# Patient Record
Sex: Female | Born: 1990 | Race: Black or African American | Hispanic: No | Marital: Single | Smoking: Current every day smoker
Health system: Southern US, Community
[De-identification: ages and names within clinical notes are randomized; demographics above are authoritative.]

## PROBLEM LIST (undated history)

## (undated) ENCOUNTER — Inpatient Hospital Stay (HOSPITAL_COMMUNITY): Payer: Self-pay

## (undated) DIAGNOSIS — Z789 Other specified health status: Secondary | ICD-10-CM

## (undated) DIAGNOSIS — I10 Essential (primary) hypertension: Secondary | ICD-10-CM

## (undated) HISTORY — PX: CLAVICLE SURGERY: SHX598

## (undated) HISTORY — PX: ACROMIO-CLAVICULAR JOINT REPAIR: SHX5183

## (undated) HISTORY — DX: Essential (primary) hypertension: I10

---
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
## 2012-01-30 ENCOUNTER — Encounter (HOSPITAL_COMMUNITY): Payer: Self-pay | Admitting: *Deleted

## 2012-01-30 ENCOUNTER — Inpatient Hospital Stay (HOSPITAL_COMMUNITY)
Admission: AD | Admit: 2012-01-30 | Discharge: 2012-01-30 | Disposition: A | Discharge status: Home / Self Care | Payer: Medicaid Other | Source: Ambulatory Visit | Attending: Obstetrics | Admitting: Obstetrics

## 2012-01-30 DIAGNOSIS — O368190 Decreased fetal movements, unspecified trimester, not applicable or unspecified: Secondary | ICD-10-CM | POA: Insufficient documentation

## 2012-01-30 NOTE — MAU Note (Signed)
FM audible on EFM, pt states she felt the movement.

## 2012-01-30 NOTE — MAU Note (Signed)
Pt states she was in an argument last night, she was very upset & stressed.  Pt also noticed decreased FM at this time.  Pt denies bleeding, discharge or LOF.
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
## 2012-01-30 NOTE — Discharge Instructions (Signed)
Fetal Movement Counts Patient Name: __________________________________________________ Patient Due Date: ____________________ Kick counts is highly recommended in high risk pregnancies, but it is a good idea for every pregnant woman to do. Start counting fetal movements at 28 weeks of the pregnancy. Fetal movements increase after eating a full meal or eating or drinking something sweet (the blood sugar is higher). It is also important to drink plenty of fluids (well hydrated) before doing the count. Lie on your left side because it helps with the circulation or you can sit in a comfortable chair with your arms over your belly (abdomen) with no distractions around you. DOING THE COUNT  Try to do the count the same time of day each time you do it.   Mark the day and time, then see how long it takes for you to feel 10 movements (kicks, flutters, swishes, rolls). You should have at least 10 movements within 2 hours. You will most likely feel 10 movements in much less than 2 hours. If you do not, wait an hour and count again. After a couple of days you will see a pattern.   What you are looking for is a change in the pattern or not enough counts in 2 hours. Is it taking longer in time to reach 10 movements?  SEEK MEDICAL CARE IF:  You feel less than 10 counts in 2 hours. Tried twice.   No movement in one hour.   The pattern is changing or taking longer each day to reach 10 counts in 2 hours.   You feel the baby is not moving as it usually does.  Date: ____________ Movements: ____________ Start time: ____________ Finish time: ____________  Date: ____________ Movements: ____________ Start time: ____________ Finish time: ____________ Date: ____________ Movements: ____________ Start time: ____________ Finish time: ____________ Date: ____________ Movements: ____________ Start time: ____________ Finish time: ____________ Date: ____________ Movements: ____________ Start time: ____________ Finish time:  ____________ Date: ____________ Movements: ____________ Start time: ____________ Finish time: ____________ Date: ____________ Movements: ____________ Start time: ____________ Finish time: ____________ Date: ____________ Movements: ____________ Start time: ____________ Finish time: ____________  Date: ____________ Movements: ____________ Start time: ____________ Finish time: ____________ Date: ____________ Movements: ____________ Start time: ____________ Finish time: ____________ Date: ____________ Movements: ____________ Start time: ____________ Finish time: ____________ Date: ____________ Movements: ____________ Start time: ____________ Finish time: ____________ Date: ____________ Movements: ____________ Start time: ____________ Finish time: ____________ Date: ____________ Movements: ____________ Start time: ____________ Finish time: ____________ Date: ____________ Movements: ____________ Start time: ____________ Finish time: ____________  Date: ____________ Movements: ____________ Start time: ____________ Finish time: ____________ Date: ____________ Movements: ____________ Start time: ____________ Finish time: ____________ Date: ____________ Movements: ____________ Start time: ____________ Finish time: ____________ Date: ____________ Movements: ____________ Start time: ____________ Finish time: ____________ Date: ____________ Movements: ____________ Start time: ____________ Finish time: ____________ Date: ____________ Movements: ____________ Start time: ____________ Finish time: ____________ Date: ____________ Movements: ____________ Start time: ____________ Finish time: ____________  Date: ____________ Movements: ____________ Start time: ____________ Finish time: ____________ Date: ____________ Movements: ____________ Start time: ____________ Finish time: ____________ Date: ____________ Movements: ____________ Start time: ____________ Finish time: ____________ Date: ____________ Movements:  ____________ Start time: ____________ Finish time: ____________ Date: ____________ Movements: ____________ Start time: ____________ Finish time: ____________ Date: ____________ Movements: ____________ Start time: ____________ Finish time: ____________ Date: ____________ Movements: ____________ Start time: ____________ Finish time: ____________  Date: ____________ Movements: ____________ Start time: ____________ Finish time: ____________ Date: ____________ Movements: ____________ Start time: ____________ Finish time: ____________ Date: ____________ Movements: ____________ Start time:   ____________ Finish time: ____________ Date: ____________ Movements: ____________ Start time: ____________ Finish time: ____________ Date: ____________ Movements: ____________ Start time: ____________ Finish time: ____________ Date: ____________ Movements: ____________ Start time: ____________ Finish time: ____________ Date: ____________ Movements: ____________ Start time: ____________ Finish time: ____________  Date: ____________ Movements: ____________ Start time: ____________ Finish time: ____________ Date: ____________ Movements: ____________ Start time: ____________ Finish time: ____________ Date: ____________ Movements: ____________ Start time: ____________ Finish time: ____________ Date: ____________ Movements: ____________ Start time: ____________ Finish time: ____________ Date: ____________ Movements: ____________ Start time: ____________ Finish time: ____________ Date: ____________ Movements: ____________ Start time: ____________ Finish time: ____________ Date: ____________ Movements: ____________ Start time: ____________ Finish time: ____________  Date: ____________ Movements: ____________ Start time: ____________ Finish time: ____________ Date: ____________ Movements: ____________ Start time: ____________ Finish time: ____________ Date: ____________ Movements: ____________ Start time: ____________ Finish  time: ____________ Date: ____________ Movements: ____________ Start time: ____________ Finish time: ____________ Date: ____________ Movements: ____________ Start time: ____________ Finish time: ____________ Date: ____________ Movements: ____________ Start time: ____________ Finish time: ____________ Date: ____________ Movements: ____________ Start time: ____________ Finish time: ____________  Date: ____________ Movements: ____________ Start time: ____________ Finish time: ____________ Date: ____________ Movements: ____________ Start time: ____________ Finish time: ____________ Date: ____________ Movements: ____________ Start time: ____________ Finish time: ____________ Date: ____________ Movements: ____________ Start time: ____________ Finish time: ____________ Date: ____________ Movements: ____________ Start time: ____________ Finish time: ____________ Date: ____________ Movements: ____________ Start time: ____________ Finish time: ____________ Document Released: 08/28/2006 Document Revised: 07/18/2011 Document Reviewed: 02/28/2009 ExitCare Patient Information 2012 [employer]. 

## 2012-05-06 ENCOUNTER — Encounter (HOSPITAL_COMMUNITY): Payer: Self-pay | Admitting: *Deleted

## 2012-05-06 ENCOUNTER — Telehealth (HOSPITAL_COMMUNITY): Payer: Self-pay | Admitting: *Deleted

## 2012-05-06 NOTE — Telephone Encounter (Signed)
Preadmission screen  

## 2012-05-15 ENCOUNTER — Encounter (HOSPITAL_COMMUNITY): Payer: Self-pay

## 2012-05-15 ENCOUNTER — Inpatient Hospital Stay (HOSPITAL_COMMUNITY)
Admission: RE | Admit: 2012-05-15 | Discharge: 2012-05-19 | DRG: 775 | Disposition: A | Discharge status: Home / Self Care | Payer: Medicaid Other | Source: Ambulatory Visit | Attending: Obstetrics | Admitting: Obstetrics

## 2012-05-15 DIAGNOSIS — O328XX0 Maternal care for other malpresentation of fetus, not applicable or unspecified: Secondary | ICD-10-CM | POA: Diagnosis present

## 2012-05-15 LAB — CBC
HCT: 33 % — ABNORMAL LOW (ref 36.0–46.0)
MCH: 30.9 pg (ref 26.0–34.0)
MCHC: 33.9 g/dL (ref 30.0–36.0)
MCV: 90.9 fL (ref 78.0–100.0)
RDW: 12.5 % (ref 11.5–15.5)

## 2012-05-15 MED ORDER — LACTATED RINGERS IV SOLN: 500.0000 mL | INTRAVENOUS | Status: DC | PRN

## 2012-05-15 MED ORDER — LACTATED RINGERS IV SOLN
INTRAVENOUS | Status: DC
  Administered 2012-05-16 – 2012-05-17 (×3): via INTRAVENOUS

## 2012-05-15 MED ORDER — LIDOCAINE HCL (PF) 1 % IJ SOLN
30.0000 mL | INTRAMUSCULAR | Status: DC | PRN
  Administered 2012-05-17: 30 mL via SUBCUTANEOUS
  Filled 2012-05-15: qty 30

## 2012-05-15 MED ORDER — FLEET ENEMA 7-19 GM/118ML RE ENEM: 1.0000 | ENEMA | RECTAL | Status: DC | PRN

## 2012-05-15 MED ORDER — ZOLPIDEM TARTRATE 5 MG PO TABS: 5.0000 mg | ORAL_TABLET | Freq: Every evening | ORAL | Status: DC | PRN

## 2012-05-15 MED ORDER — OXYTOCIN 40 UNITS IN LACTATED RINGERS INFUSION - SIMPLE MED: 62.5000 mL/h | Freq: Once | INTRAVENOUS | Status: DC

## 2012-05-15 MED ORDER — CITRIC ACID-SODIUM CITRATE 334-500 MG/5ML PO SOLN: 30.0000 mL | ORAL | Status: DC | PRN

## 2012-05-15 MED ORDER — ONDANSETRON HCL 4 MG/2ML IJ SOLN
4.0000 mg | Freq: Four times a day (QID) | INTRAMUSCULAR | Status: DC | PRN
  Administered 2012-05-16 (×3): 4 mg via INTRAVENOUS
  Filled 2012-05-15 (×3): qty 2

## 2012-05-15 MED ORDER — OXYTOCIN BOLUS FROM INFUSION
500.0000 mL | Freq: Once | INTRAVENOUS | Status: DC
  Filled 2012-05-15: qty 500

## 2012-05-15 MED ORDER — ACETAMINOPHEN 325 MG PO TABS: 650.0000 mg | ORAL_TABLET | ORAL | Status: DC | PRN

## 2012-05-15 MED ORDER — OXYCODONE-ACETAMINOPHEN 5-325 MG PO TABS: 1.0000 | ORAL_TABLET | ORAL | Status: DC | PRN

## 2012-05-15 MED ORDER — BUTORPHANOL TARTRATE 1 MG/ML IJ SOLN
2.0000 mg | INTRAMUSCULAR | Status: DC | PRN
  Administered 2012-05-16: 2 mg via INTRAVENOUS
  Filled 2012-05-15: qty 2

## 2012-05-15 MED ORDER — IBUPROFEN 600 MG PO TABS: 600.0000 mg | ORAL_TABLET | Freq: Four times a day (QID) | ORAL | Status: DC | PRN

## 2012-05-15 MED ORDER — TERBUTALINE SULFATE 1 MG/ML IJ SOLN: 0.2500 mg | Freq: Once | INTRAMUSCULAR | Status: AC | PRN

## 2012-05-15 MED ORDER — OXYTOCIN 40 UNITS IN LACTATED RINGERS INFUSION - SIMPLE MED
1.0000 m[IU]/min | INTRAVENOUS | Status: DC
  Administered 2012-05-15: 2 m[IU]/min via INTRAVENOUS
  Administered 2012-05-15: 4 m[IU]/min via INTRAVENOUS
  Filled 2012-05-15: qty 1000

## 2012-05-16 ENCOUNTER — Encounter (HOSPITAL_COMMUNITY): Payer: Self-pay | Admitting: Anesthesiology

## 2012-05-16 ENCOUNTER — Inpatient Hospital Stay (HOSPITAL_COMMUNITY): Payer: Medicaid Other | Admitting: Anesthesiology

## 2012-05-16 MED ORDER — PHENYLEPHRINE 40 MCG/ML (10ML) SYRINGE FOR IV PUSH (FOR BLOOD PRESSURE SUPPORT): 80.0000 ug | PREFILLED_SYRINGE | INTRAVENOUS | Status: DC | PRN

## 2012-05-16 MED ORDER — LACTATED RINGERS IV SOLN: 500.0000 mL | Freq: Once | INTRAVENOUS | Status: DC

## 2012-05-16 MED ORDER — SODIUM BICARBONATE 8.4 % IV SOLN
INTRAVENOUS | Status: DC | PRN
  Administered 2012-05-16: 5 mL via EPIDURAL

## 2012-05-16 MED ORDER — DIPHENHYDRAMINE HCL 50 MG/ML IJ SOLN: 12.5000 mg | INTRAMUSCULAR | Status: DC | PRN

## 2012-05-16 MED ORDER — EPHEDRINE 5 MG/ML INJ: 10.0000 mg | INTRAVENOUS | Status: DC | PRN

## 2012-05-16 MED ORDER — EPHEDRINE 5 MG/ML INJ
10.0000 mg | INTRAVENOUS | Status: DC | PRN
  Filled 2012-05-16: qty 4

## 2012-05-16 MED ORDER — FENTANYL 2.5 MCG/ML BUPIVACAINE 1/10 % EPIDURAL INFUSION (WH - ANES)
INTRAMUSCULAR | Status: DC | PRN
  Administered 2012-05-16: 14 mL/h via EPIDURAL

## 2012-05-16 MED ORDER — PHENYLEPHRINE 40 MCG/ML (10ML) SYRINGE FOR IV PUSH (FOR BLOOD PRESSURE SUPPORT)
80.0000 ug | PREFILLED_SYRINGE | INTRAVENOUS | Status: DC | PRN
  Filled 2012-05-16: qty 5

## 2012-05-16 MED ORDER — FENTANYL 2.5 MCG/ML BUPIVACAINE 1/10 % EPIDURAL INFUSION (WH - ANES)
14.0000 mL/h | INTRAMUSCULAR | Status: DC
  Administered 2012-05-17: 14 mL/h via EPIDURAL
  Filled 2012-05-16 (×3): qty 123

## 2012-05-16 MED ORDER — OXYTOCIN 40 UNITS IN LACTATED RINGERS INFUSION - SIMPLE MED: 1.0000 m[IU]/min | INTRAVENOUS | Status: DC

## 2012-05-16 NOTE — Anesthesia Preprocedure Evaluation (Signed)

## 2012-05-16 NOTE — H&P (Signed)
This is Dr. Francoise Ceo dictating the history and physical on  Angela Cortez she's a 21 year old gravida 1 at 40 weeks and 4 days EDC 05/12/2012 was brought in for induction last night her GBS is negative she is contracting every 4-5 minutes her cervix is 2 cm 80% with the vertex at -2-3 station amniotomy was performed the fluids clear IUPC was inserted Past medical history negative Past surgical history negative Social history negative System review noncontributory Physical exam revealed a well-developed female in active labor HEENT negative Breasts negative Heart regular rhythm no murmurs no gallops Lungs clear to P&A Abdomen term Pelvic as described above Extremities negative

## 2012-05-16 NOTE — Anesthesia Procedure Notes (Signed)

## 2012-05-17 ENCOUNTER — Encounter (HOSPITAL_COMMUNITY): Payer: Self-pay

## 2012-05-17 MED ORDER — OXYCODONE-ACETAMINOPHEN 5-325 MG PO TABS: 1.0000 | ORAL_TABLET | ORAL | Status: DC | PRN

## 2012-05-17 MED ORDER — PRENATAL MULTIVITAMIN CH
1.0000 | ORAL_TABLET | Freq: Every day | ORAL | Status: DC
  Administered 2012-05-17 – 2012-05-18 (×2): 1 via ORAL
  Filled 2012-05-17 (×2): qty 1

## 2012-05-17 MED ORDER — DIBUCAINE 1 % RE OINT: 1.0000 "application " | TOPICAL_OINTMENT | RECTAL | Status: DC | PRN

## 2012-05-17 MED ORDER — MAGNESIUM HYDROXIDE 400 MG/5ML PO SUSP: 30.0000 mL | ORAL | Status: DC | PRN

## 2012-05-17 MED ORDER — BENZOCAINE-MENTHOL 20-0.5 % EX AERO
1.0000 "application " | INHALATION_SPRAY | CUTANEOUS | Status: DC | PRN
  Filled 2012-05-17: qty 56

## 2012-05-17 MED ORDER — MEASLES, MUMPS & RUBELLA VAC ~~LOC~~ INJ
0.5000 mL | INJECTION | Freq: Once | SUBCUTANEOUS | Status: DC
  Filled 2012-05-17: qty 0.5

## 2012-05-17 MED ORDER — ZOLPIDEM TARTRATE 5 MG PO TABS: 5.0000 mg | ORAL_TABLET | Freq: Every evening | ORAL | Status: DC | PRN

## 2012-05-17 MED ORDER — LANOLIN HYDROUS EX OINT: TOPICAL_OINTMENT | CUTANEOUS | Status: DC | PRN

## 2012-05-17 MED ORDER — FERROUS SULFATE 325 (65 FE) MG PO TABS
325.0000 mg | ORAL_TABLET | Freq: Two times a day (BID) | ORAL | Status: DC
  Administered 2012-05-17 – 2012-05-19 (×5): 325 mg via ORAL
  Filled 2012-05-17 (×5): qty 1

## 2012-05-17 MED ORDER — SENNOSIDES-DOCUSATE SODIUM 8.6-50 MG PO TABS
2.0000 | ORAL_TABLET | Freq: Every day | ORAL | Status: DC
  Administered 2012-05-17 – 2012-05-19 (×3): 2 via ORAL

## 2012-05-17 MED ORDER — ONDANSETRON HCL 4 MG PO TABS: 4.0000 mg | ORAL_TABLET | ORAL | Status: DC | PRN

## 2012-05-17 MED ORDER — WITCH HAZEL-GLYCERIN EX PADS: 1.0000 "application " | MEDICATED_PAD | CUTANEOUS | Status: DC | PRN

## 2012-05-17 MED ORDER — TETANUS-DIPHTH-ACELL PERTUSSIS 5-2.5-18.5 LF-MCG/0.5 IM SUSP
0.5000 mL | Freq: Once | INTRAMUSCULAR | Status: AC
  Administered 2012-05-18: 0.5 mL via INTRAMUSCULAR
  Filled 2012-05-17: qty 0.5

## 2012-05-17 MED ORDER — ONDANSETRON HCL 4 MG/2ML IJ SOLN: 4.0000 mg | INTRAMUSCULAR | Status: DC | PRN

## 2012-05-17 MED ORDER — IBUPROFEN 600 MG PO TABS
600.0000 mg | ORAL_TABLET | Freq: Four times a day (QID) | ORAL | Status: DC
  Administered 2012-05-17 – 2012-05-19 (×9): 600 mg via ORAL
  Filled 2012-05-17 (×9): qty 1

## 2012-05-17 MED ORDER — DIPHENHYDRAMINE HCL 25 MG PO CAPS: 25.0000 mg | ORAL_CAPSULE | Freq: Four times a day (QID) | ORAL | Status: DC | PRN

## 2012-05-17 NOTE — Anesthesia Postprocedure Evaluation (Signed)
  Anesthesia Post-op Note  Patient: Angela Cortez  Procedure(s) Performed: * No procedures listed *  Patient Location: [facility]  Anesthesia Type: Epidural  Level of Consciousness: awake, alert  and oriented  Airway and Oxygen Therapy: Patient Spontanous Breathing  Post-op Pain: mild  Post-op Assessment: Patient's Cardiovascular Status Stable, Respiratory Function Stable, Patent Airway, No signs of Nausea or vomiting and Pain level controlled  Post-op Vital Signs: stable  Complications: No apparent anesthesia complications

## 2012-05-17 NOTE — Plan of Care (Signed)
Problem: Discharge Progression Outcomes Goal: Activity appropriate for discharge plan Outcome: Completed/Met Date Met:  05/17/12 Patient walks halls frequently and tolerates it well. Goal: Complications resolved/controlled Outcome: Completed/Met Date Met:  05/17/12 Patient has had some problems with following visitation rules and this was dicussed with her and a couple of her family members as to why these rules are in place.Patient and family stated they understood why and were willing to cooperate. Goal: Pain controlled with appropriate interventions Outcome: Completed/Met Date Met:  05/17/12 Good relief with po Motrin at this point. Goal: Discharge plan in place and appropriate Outcome: Completed/Met Date Met:  05/17/12 Stable vital signs Pain control Understands self care Understands when to call MD

## 2012-05-17 NOTE — Clinical Social Work Note (Signed)
Clinical Social Work Department PSYCHOSOCIAL ASSESSMENT - MATERNAL/CHILD 05/17/2012  Patient:  Angela Cortez,Angela Cortez  Account Number:  [id number]  Admit Date:  05/15/2012  Childs Name:   Angela Cortez    Clinical Social Worker:  Nicollette Wilhelmi, LCSW   Date/Time:  05/17/2012 03:00 PM  Date Referred:  05/17/2012   Referral source  Physician     Referred reason  NICU   Other referral source:    I:  FAMILY / HOME ENVIRONMENT Child's legal guardian:  PARENT  Guardian - Name Guardian - Age Guardian - Address  Angela Cortez 21 [address] [place], [place] [address]  Angela Cortez     Other household support members/support persons Other support:    II  PSYCHOSOCIAL DATA Information Source:    Financial and Community Resources Employment:   MOB: unemployed  FOB:  Staff zone   Financial resources:  Medicaid If Medicaid - County:  [place] Other  Food Stamps  WIC   School / Grade:   Maternity Care Coordinator / Child Services Coordination / Early Interventions:  Cultural issues impacting care:    III  STRENGTHS Strengths  Adequate Resources  Home prepared for Child (including basic supplies)  Compliance with medical plan  Supportive family/friends  Understanding of illness   Strength comment:    IV  RISK FACTORS AND CURRENT PROBLEMS Current Problem:  None   Risk Factor & Current Problem Patient Issue Family Issue Risk Factor / Current Problem Comment   Cortez Cortez     V  SOCIAL WORK ASSESSMENT CSW spoke with MOB in room.  CSW discussed emotional state and typical symptoms to be expected.  MOB reported she had no concerns at this time.  MOB reports picking out pediatrician with [facility] (Dr. Wallace).  CSW discussed any concerns with supplies or family support. MOB did not express any concerns at this time and she had 6 family members in room and did not have any concerns with family support.  No hx of drug abuse or LPNC and no current concerns.  MOB reported to  CSW concerns with nursing care in the NICU.  CSW addressed this with charge nurse and will continue to follow infant and MOB for support.      VI SOCIAL WORK PLAN Social Work Plan  Psychosocial Support/Ongoing Assessment of Needs   Type of pt/family education:   If child protective services report - county:   If child protective services report - date:   Information/referral to community resources comment:   Other social work plan:    

## 2012-05-18 MED ORDER — COMPLETENATE 29-1 MG PO CHEW
1.0000 | CHEWABLE_TABLET | Freq: Every day | ORAL | Status: DC
  Administered 2012-05-19: 1 via ORAL
  Filled 2012-05-18 (×2): qty 1

## 2012-05-18 MED ORDER — MEDROXYPROGESTERONE ACETATE 150 MG/ML IM SUSP
150.0000 mg | Freq: Once | INTRAMUSCULAR | Status: AC
  Administered 2012-05-19: 150 mg via INTRAMUSCULAR
  Filled 2012-05-18: qty 1

## 2012-05-18 MED ORDER — INFLUENZA VIRUS VACC SPLIT PF IM SUSP
0.5000 mL | INTRAMUSCULAR | Status: AC
  Administered 2012-05-18: 0.5 mL via INTRAMUSCULAR

## 2012-05-18 NOTE — Care Management (Signed)
Ur chart review completed.  

## 2012-05-18 NOTE — Progress Notes (Signed)
Patient ID: Angela Cortez, female   DOB: 03-06-1991, 20 y.o.   MRN: [id number] Postpartum day one Vital signs normal Fundus firm Lochia moderate Legs negative doing well

## 2012-05-19 NOTE — Discharge Summary (Signed)
Obstetric Discharge Summary Reason for Admission: induction of labor Prenatal Procedures: none Intrapartum Procedures: spontaneous vaginal delivery Postpartum Procedures: none Complications-Operative and Postpartum: none Hemoglobin  Date Value Range Status  05/15/2012 11.2* 12.0 - 15.0 g/dL Final     HCT  Date Value Range Status  05/15/2012 33.0* 36.0 - 46.0 % Final    Physical Exam:  General: alert Lochia: appropriate Uterine Fundus: firm Incision: healing well DVT Evaluation: No evidence of DVT seen on physical exam.  Discharge Diagnoses: Term Pregnancy-delivered  Discharge Information: Date: 05/19/2012 Activity: pelvic rest Diet: routine Medications: Percocet Condition: stable Instructions: refer to practice specific booklet Discharge to: home Follow-up Information    In 6 weeks to follow up.   Contact information:   b Angela Cortez         Newborn Data: Live born female  Birth Weight: 7 lb 13.9 oz (3569 g) APGAR: 1, 5  Home with mother.  Angela Cortez A 05/19/2012, 7:10 AM

## 2013-09-13 LAB — LAB REPORT - SCANNED: PAP SMEAR: NEGATIVE

## 2014-06-13 ENCOUNTER — Encounter (HOSPITAL_COMMUNITY): Payer: Self-pay

## 2014-07-08 ENCOUNTER — Emergency Department (HOSPITAL_COMMUNITY)
Admission: EM | Admit: 2014-07-08 | Discharge: 2014-07-09 | Disposition: A | Discharge status: Home / Self Care | Payer: Medicaid Other | Attending: Emergency Medicine | Admitting: Emergency Medicine

## 2014-07-08 ENCOUNTER — Encounter (HOSPITAL_COMMUNITY): Payer: Self-pay | Admitting: Emergency Medicine

## 2014-07-08 DIAGNOSIS — F911 Conduct disorder, childhood-onset type: Secondary | ICD-10-CM | POA: Insufficient documentation

## 2014-07-08 DIAGNOSIS — Z3202 Encounter for pregnancy test, result negative: Secondary | ICD-10-CM | POA: Diagnosis not present

## 2014-07-08 DIAGNOSIS — F1210 Cannabis abuse, uncomplicated: Secondary | ICD-10-CM | POA: Insufficient documentation

## 2014-07-08 DIAGNOSIS — Z87891 Personal history of nicotine dependence: Secondary | ICD-10-CM | POA: Diagnosis not present

## 2014-07-08 DIAGNOSIS — R454 Irritability and anger: Secondary | ICD-10-CM

## 2014-07-08 LAB — COMPREHENSIVE METABOLIC PANEL
ALK PHOS: 61 U/L (ref 39–117)
ALT: 7 U/L (ref 0–35)
AST: 16 U/L (ref 0–37)
Albumin: 4 g/dL (ref 3.5–5.2)
Anion gap: 14 (ref 5–15)
BUN: 10 mg/dL (ref 6–23)
CHLORIDE: 101 meq/L (ref 96–112)
CO2: 25 meq/L (ref 19–32)
CREATININE: 0.78 mg/dL (ref 0.50–1.10)
Calcium: 9.9 mg/dL (ref 8.4–10.5)
GFR calc Af Amer: >90 mL/min (ref >90)
Glucose, Bld: 120 mg/dL — ABNORMAL HIGH (ref 70–99)
Potassium: 3.8 mEq/L (ref 3.7–5.3)
Sodium: 140 mEq/L (ref 137–147)
Total Protein: 8 g/dL (ref 6.0–8.3)

## 2014-07-08 LAB — CBC WITH DIFFERENTIAL/PLATELET
Basophils Absolute: 0 10*3/uL (ref 0.0–0.1)
Basophils Relative: 0 % (ref 0–1)
Eosinophils Absolute: 0.1 10*3/uL (ref 0.0–0.7)
Eosinophils Relative: 1 % (ref 0–5)
HCT: 38.3 % (ref 36.0–46.0)
Hemoglobin: 12.9 g/dL (ref 12.0–15.0)
Lymphocytes Relative: 38 % (ref 12–46)
Lymphs Abs: 2.9 10*3/uL (ref 0.7–4.0)
MCH: 31.4 pg (ref 26.0–34.0)
MCHC: 33.7 g/dL (ref 30.0–36.0)
MCV: 93.2 fL (ref 78.0–100.0)
Monocytes Absolute: 0.4 10*3/uL (ref 0.1–1.0)
Monocytes Relative: 6 % (ref 3–12)
Neutro Abs: 4.2 10*3/uL (ref 1.7–7.7)
Neutrophils Relative %: 55 % (ref 43–77)
Platelets: 271 10*3/uL (ref 150–400)
RBC: 4.11 MIL/uL (ref 3.87–5.11)
RDW: 12.6 % (ref 11.5–15.5)
WBC: 7.7 10*3/uL (ref 4.0–10.5)

## 2014-07-08 LAB — RAPID URINE DRUG SCREEN, HOSP PERFORMED
AMPHETAMINES: NOT DETECTED
BARBITURATES: NOT DETECTED
Benzodiazepines: NOT DETECTED
Cocaine: NOT DETECTED
Opiates: NOT DETECTED
TETRAHYDROCANNABINOL: POSITIVE — AB

## 2014-07-08 LAB — ACETAMINOPHEN LEVEL: Acetaminophen (Tylenol), Serum: <15 ug/mL (ref 10–30)

## 2014-07-08 LAB — PREGNANCY, URINE: Preg Test, Ur: NEGATIVE

## 2014-07-08 LAB — SALICYLATE LEVEL: Salicylate Lvl: <2 mg/dL — ABNORMAL LOW (ref 2.8–20.0)

## 2014-07-08 LAB — ETHANOL

## 2014-07-08 MED ORDER — IBUPROFEN 200 MG PO TABS: 600.0000 mg | ORAL_TABLET | Freq: Three times a day (TID) | ORAL | Status: DC | PRN

## 2014-07-08 MED ORDER — NICOTINE 21 MG/24HR TD PT24: 21.0000 mg | MEDICATED_PATCH | Freq: Every day | TRANSDERMAL | Status: DC

## 2014-07-08 MED ORDER — LORAZEPAM 1 MG PO TABS: 1.0000 mg | ORAL_TABLET | Freq: Three times a day (TID) | ORAL | Status: DC | PRN

## 2014-07-08 MED ORDER — ACETAMINOPHEN 325 MG PO TABS: 650.0000 mg | ORAL_TABLET | ORAL | Status: DC | PRN

## 2014-07-08 MED ORDER — ZOLPIDEM TARTRATE 5 MG PO TABS: 5.0000 mg | ORAL_TABLET | Freq: Every evening | ORAL | Status: DC | PRN

## 2014-07-08 MED ORDER — ONDANSETRON HCL 4 MG PO TABS: 4.0000 mg | ORAL_TABLET | Freq: Three times a day (TID) | ORAL | Status: DC | PRN

## 2014-07-08 MED ORDER — ALUM & MAG HYDROXIDE-SIMETH 200-200-20 MG/5ML PO SUSP: 30.0000 mL | ORAL | Status: DC | PRN
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
## 2014-07-08 NOTE — Discharge Instructions (Signed)
Anger Management Anger is a normal human emotion. However, anger can range from mild irritation to rage. When your anger becomes harmful to yourself or others, it is unhealthy anger.  CAUSES  There are many reasons for unhealthy anger. Many people learn how to express anger from observing how their family expressed anger. In troubled, chaotic, or abusive families, anger can be expressed as rage or even violence. Children can grow up never learning how healthy anger can be expressed. Factors that contribute to unhealthy anger include:   Drug or alcohol abuse.  Post-traumatic stress disorder.  Traumatic brain injury. COMPLICATIONS  People with unhealthy anger tend to overreact and retaliate against a real or imagined threat. The need to retaliate can turn into violence or verbal abuse against another person. Chronic anger can lead to health problems, such as hypertension, high blood pressure, and depression. TREATMENT  Exercising, relaxing, meditating, or writing out your feelings all can be beneficial in managing moderate anger. For unhealthy anger, the following methods may be used:  Cognitive-behavioral counseling (learning skills to change the thoughts that influence your mood).  Relaxation training.  Interpersonal counseling.  Assertive communication skills.  Medication. Document Released: 05/26/2007 Document Revised: 10/21/2011 Document Reviewed: 10/04/2010 ExitCare Patient Information 2015 [employer]. This information is not intended to replace advice given to you by your health care provider. Make sure you discuss any questions you have with your health care provider.  

## 2014-07-08 NOTE — ED Notes (Signed)
Pt belongings are at the triage nurses station at this time.

## 2014-07-08 NOTE — BH Assessment (Signed)
Assessment completed. Consulted Serena Colonel, NP who agrees that pt does not meet inpatient criteria. Antonietta Breach, PA-C has been informed of the recommendation. Spoke with Dr. Ralene Bathe about rescinding the IVC paperwork. EDP will speak with petitioner prior to making a decision.
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
## 2014-07-09 NOTE — ED Notes (Signed)
Cell phone was returned to Pt, Pt signed sheet from security acknowledging she received her property.   This was witnessed by Enzo Bi with security.
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
## 2015-01-26 ENCOUNTER — Encounter (HOSPITAL_COMMUNITY): Payer: Self-pay | Admitting: Emergency Medicine

## 2015-01-26 DIAGNOSIS — R197 Diarrhea, unspecified: Secondary | ICD-10-CM | POA: Diagnosis not present

## 2015-01-26 DIAGNOSIS — R109 Unspecified abdominal pain: Secondary | ICD-10-CM | POA: Insufficient documentation

## 2015-01-26 DIAGNOSIS — N898 Other specified noninflammatory disorders of vagina: Secondary | ICD-10-CM | POA: Insufficient documentation

## 2015-01-26 LAB — URINALYSIS, ROUTINE W REFLEX MICROSCOPIC
Glucose, UA: NEGATIVE mg/dL
Hgb urine dipstick: NEGATIVE
Ketones, ur: 15 mg/dL — AB
Leukocytes, UA: NEGATIVE
NITRITE: NEGATIVE
Protein, ur: NEGATIVE mg/dL
Specific Gravity, Urine: 1.035 — ABNORMAL HIGH (ref 1.005–1.030)
UROBILINOGEN UA: 0.2 mg/dL (ref 0.0–1.0)
pH: 5.5 (ref 5.0–8.0)

## 2015-01-26 LAB — CBC WITH DIFFERENTIAL/PLATELET
BASOS PCT: 0 % (ref 0–1)
Basophils Absolute: 0 10*3/uL (ref 0.0–0.1)
Eosinophils Absolute: 0.2 10*3/uL (ref 0.0–0.7)
Eosinophils Relative: 2 % (ref 0–5)
HEMATOCRIT: 35.3 % — AB (ref 36.0–46.0)
HEMOGLOBIN: 12.3 g/dL (ref 12.0–15.0)
LYMPHS ABS: 2.8 10*3/uL (ref 0.7–4.0)
LYMPHS PCT: 42 % (ref 12–46)
MCH: 32.1 pg (ref 26.0–34.0)
MCHC: 34.8 g/dL (ref 30.0–36.0)
MCV: 92.2 fL (ref 78.0–100.0)
MONOS PCT: 8 % (ref 3–12)
Monocytes Absolute: 0.6 10*3/uL (ref 0.1–1.0)
NEUTROS ABS: 3.2 10*3/uL (ref 1.7–7.7)
Neutrophils Relative %: 48 % (ref 43–77)
Platelets: 275 10*3/uL (ref 150–400)
RBC: 3.83 MIL/uL — ABNORMAL LOW (ref 3.87–5.11)
RDW: 12.9 % (ref 11.5–15.5)
WBC: 6.7 10*3/uL (ref 4.0–10.5)

## 2015-01-26 LAB — POC URINE PREG, ED: Preg Test, Ur: NEGATIVE

## 2015-01-26 NOTE — ED Notes (Signed)
The patient said she has been having abdominal pain for three.  She says she has been feeling nausea and has had diarrhea.  The patient said she has taken a pregnancy test and it was negative.  The patent says she has had a white discharge, that is thick and foul smelling.

## 2015-01-27 ENCOUNTER — Emergency Department (HOSPITAL_COMMUNITY)
Admission: EM | Admit: 2015-01-27 | Discharge: 2015-01-27 | Discharge status: Against Medical Advice | Payer: Medicaid Other | Attending: Emergency Medicine | Admitting: Emergency Medicine

## 2015-01-27 LAB — LIPASE, BLOOD: LIPASE: 14 U/L — AB (ref 22–51)

## 2015-01-27 LAB — COMPREHENSIVE METABOLIC PANEL
ALK PHOS: 51 U/L (ref 38–126)
ALT: 11 U/L — AB (ref 14–54)
AST: 24 U/L (ref 15–41)
Albumin: 3.8 g/dL (ref 3.5–5.0)
Anion gap: 8 (ref 5–15)
CO2: 26 mmol/L (ref 22–32)
Calcium: 8.9 mg/dL (ref 8.9–10.3)
Chloride: 101 mmol/L (ref 101–111)
Creatinine, Ser: 0.88 mg/dL (ref 0.44–1.00)
GFR calc non Af Amer: >60 mL/min (ref >60)
GLUCOSE: 97 mg/dL (ref 65–99)
POTASSIUM: 3.2 mmol/L — AB (ref 3.5–5.1)
Sodium: 135 mmol/L (ref 135–145)
Total Bilirubin: 0.3 mg/dL (ref 0.3–1.2)
Total Protein: 7 g/dL (ref 6.5–8.1)

## 2015-01-27 NOTE — ED Notes (Addendum)
Called pt multiple times with no answer.

## 2015-03-27 ENCOUNTER — Emergency Department (HOSPITAL_COMMUNITY)
Admission: EM | Admit: 2015-03-27 | Discharge: 2015-03-27 | Disposition: A | Discharge status: Home / Self Care | Payer: Medicaid Other | Attending: Emergency Medicine | Admitting: Emergency Medicine

## 2015-03-27 ENCOUNTER — Encounter (HOSPITAL_COMMUNITY): Payer: Self-pay | Admitting: Emergency Medicine

## 2015-03-27 DIAGNOSIS — B9689 Other specified bacterial agents as the cause of diseases classified elsewhere: Secondary | ICD-10-CM

## 2015-03-27 DIAGNOSIS — N760 Acute vaginitis: Secondary | ICD-10-CM | POA: Diagnosis not present

## 2015-03-27 DIAGNOSIS — N898 Other specified noninflammatory disorders of vagina: Secondary | ICD-10-CM | POA: Diagnosis present

## 2015-03-27 DIAGNOSIS — Z720 Tobacco use: Secondary | ICD-10-CM | POA: Diagnosis not present

## 2015-03-27 DIAGNOSIS — Z3202 Encounter for pregnancy test, result negative: Secondary | ICD-10-CM | POA: Diagnosis not present

## 2015-03-27 LAB — CBC WITH DIFFERENTIAL/PLATELET
BASOS PCT: 0 % (ref 0–1)
Basophils Absolute: 0 10*3/uL (ref 0.0–0.1)
Eosinophils Absolute: 0.2 10*3/uL (ref 0.0–0.7)
Eosinophils Relative: 3 % (ref 0–5)
HCT: 37.6 % (ref 36.0–46.0)
Hemoglobin: 12.9 g/dL (ref 12.0–15.0)
Lymphocytes Relative: 51 % — ABNORMAL HIGH (ref 12–46)
Lymphs Abs: 3 10*3/uL (ref 0.7–4.0)
MCH: 31.4 pg (ref 26.0–34.0)
MCHC: 34.3 g/dL (ref 30.0–36.0)
MCV: 91.5 fL (ref 78.0–100.0)
Monocytes Absolute: 0.7 10*3/uL (ref 0.1–1.0)
Monocytes Relative: 11 % (ref 3–12)
NEUTROS PCT: 35 % — AB (ref 43–77)
Neutro Abs: 2.1 10*3/uL (ref 1.7–7.7)
Platelets: 244 10*3/uL (ref 150–400)
RBC: 4.11 MIL/uL (ref 3.87–5.11)
RDW: 12.7 % (ref 11.5–15.5)
WBC: 5.9 10*3/uL (ref 4.0–10.5)

## 2015-03-27 LAB — WET PREP, GENITAL
Trich, Wet Prep: NONE SEEN
YEAST WET PREP: NONE SEEN

## 2015-03-27 LAB — COMPREHENSIVE METABOLIC PANEL
ALBUMIN: 4.2 g/dL (ref 3.5–5.0)
ALK PHOS: 57 U/L (ref 38–126)
ALT: 10 U/L — AB (ref 14–54)
ANION GAP: 11 (ref 5–15)
AST: 19 U/L (ref 15–41)
BUN: 9 mg/dL (ref 6–20)
CO2: 23 mmol/L (ref 22–32)
CREATININE: 0.97 mg/dL (ref 0.44–1.00)
Calcium: 9.4 mg/dL (ref 8.9–10.3)
Chloride: 102 mmol/L (ref 101–111)
GFR calc Af Amer: >60 mL/min (ref >60)
GFR calc non Af Amer: >60 mL/min (ref >60)
GLUCOSE: 95 mg/dL (ref 65–99)
Potassium: 4.2 mmol/L (ref 3.5–5.1)
SODIUM: 136 mmol/L (ref 135–145)
Total Bilirubin: 0.3 mg/dL (ref 0.3–1.2)
Total Protein: 7.9 g/dL (ref 6.5–8.1)

## 2015-03-27 LAB — URINALYSIS, ROUTINE W REFLEX MICROSCOPIC
Bilirubin Urine: NEGATIVE
Glucose, UA: NEGATIVE mg/dL
Hgb urine dipstick: NEGATIVE
KETONES UR: NEGATIVE mg/dL
LEUKOCYTES UA: NEGATIVE
NITRITE: NEGATIVE
Protein, ur: NEGATIVE mg/dL
Specific Gravity, Urine: 1.026 (ref 1.005–1.030)
Urobilinogen, UA: 1 mg/dL (ref 0.0–1.0)
pH: 6 (ref 5.0–8.0)

## 2015-03-27 LAB — POC URINE PREG, ED: Preg Test, Ur: NEGATIVE

## 2015-03-27 MED ORDER — METRONIDAZOLE 500 MG PO TABS: 500.0000 mg | ORAL_TABLET | Freq: Two times a day (BID) | ORAL | Status: DC

## 2015-03-27 NOTE — Discharge Instructions (Signed)
Bacterial Vaginosis Bacterial vaginosis is a vaginal infection that occurs when the normal balance of bacteria in the vagina is disrupted. It results from an overgrowth of certain bacteria. This is the most common vaginal infection in women of childbearing age. Treatment is important to prevent complications, especially in pregnant women, as it can cause a premature delivery. CAUSES  Bacterial vaginosis is caused by an increase in harmful bacteria that are normally present in smaller amounts in the vagina. Several different kinds of bacteria can cause bacterial vaginosis. However, the reason that the condition develops is not fully understood. RISK FACTORS Certain activities or behaviors can put you at an increased risk of developing bacterial vaginosis, including:  Having a new sex partner or multiple sex partners.  Douching.  Using an intrauterine device (IUD) for contraception. Women do not get bacterial vaginosis from toilet seats, bedding, swimming pools, or contact with objects around them. SIGNS AND SYMPTOMS  Some women with bacterial vaginosis have no signs or symptoms. Common symptoms include:  Grey vaginal discharge.  A fishlike odor with discharge, especially after sexual intercourse.  Itching or burning of the vagina and vulva.  Burning or pain with urination. DIAGNOSIS  Your health care provider will take a medical history and examine the vagina for signs of bacterial vaginosis. A sample of vaginal fluid may be taken. Your health care provider will look at this sample under a microscope to check for bacteria and abnormal cells. A vaginal pH test may also be done.  TREATMENT  Bacterial vaginosis may be treated with antibiotic medicines. These may be given in the form of a pill or a vaginal cream. A second round of antibiotics may be prescribed if the condition comes back after treatment.  HOME CARE INSTRUCTIONS   Only take over-the-counter or prescription medicines as  directed by your health care provider.  If antibiotic medicine was prescribed, take it as directed. Make sure you finish it even if you start to feel better.  Do not have sex until treatment is completed.  Tell all sexual partners that you have a vaginal infection. They should see their health care provider and be treated if they have problems, such as a mild rash or itching.  Practice safe sex by using condoms and only having one sex partner. SEEK MEDICAL CARE IF:   Your symptoms are not improving after 3 days of treatment.  You have increased discharge or pain.  You have a fever. MAKE SURE YOU:   Understand these instructions.  Will watch your condition.  Will get help right away if you are not doing well or get worse. FOR MORE INFORMATION  Centers for Disease Control and Prevention, Division of STD Prevention: www.cdc.gov/std American Sexual Health Association (ASHA): www.ashastd.org  Document Released: 07/29/2005 Document Revised: 05/19/2013 Document Reviewed: 03/10/2013 ExitCare Patient Information 2015 [employer]. This information is not intended to replace advice given to you by your health care provider. Make sure you discuss any questions you have with your health care provider.  

## 2015-03-27 NOTE — ED Notes (Signed)
Pt. reports malodorous vaginal discharge for 2 weeks , denies dysuria , no fever or chills.

## 2015-03-27 NOTE — ED Provider Notes (Signed)
CSN: [id number]     Arrival date & time 03/27/15  2043 History  This chart was scribed for Ripley Fraise, MD by Ludger Nutting, ED Scribe. This patient was seen in room TR01C/TR01C and the patient's care was started 9:30 PM.    Chief Complaint  Patient presents with  . Vaginal Discharge    The history is provided by the patient. No language interpreter was used.     HPI Comments: Angela Cortez is a 24 y.o. female who presents to the Emergency Department complaining of intermittent, unchanged, malodorous vaginal discharge for 2 weeks. She reports recent anal intercourse with significant other which she believes is the cause of her vaginal discharge. She denies fever, vomiting, abdominal pain  Past Medical History  Diagnosis Date  . No pertinent past medical history    Past Surgical History  Procedure Laterality Date  . Clavicle surgery     Family History  Problem Relation Age of Onset  . Diabetes Father   . Hypertension Father   . Cancer Paternal Uncle   . Cancer Paternal Grandmother    Social History  Substance Use Topics  . Smoking status: Current Every Day Smoker -- 0.25 packs/day    Types: Cigarettes  . Smokeless tobacco: None  . Alcohol Use: Yes     Comment: occ   OB History    Gravida Para Term Preterm AB TAB SAB Ectopic Multiple Living   1 1 1             Review of Systems  Constitutional: Negative for fever.  Gastrointestinal: Negative for vomiting and abdominal pain.  Genitourinary: Positive for vaginal discharge. Negative for vaginal bleeding.   Allergies  Review of patient's allergies indicates no known allergies.  Home Medications   Prior to Admission medications   Not on File   BP 108/72 mmHg  Pulse 93  Temp(Src) 98.9 F (37.2 C) (Oral)  Resp 18  Ht 5\' 4"  (1.626 m)  Wt 152 lb (68.947 kg)  BMI 26.08 kg/m2  SpO2 99%  LMP 03/15/2015 Physical Exam  Nursing note and vitals reviewed. CONSTITUTIONAL: Well developed/well nourished HEAD:  Normocephalic/atraumatic EYES: EOMI ENMT: Mucous membranes moist NECK: supple no meningeal signs CV: S1/S2 noted, no murmurs/rubs/gallops noted LUNGS: Lungs are clear to auscultation bilaterally, no apparent distress ABDOMEN: soft, nontender, no rebound or guarding, bowel sounds noted throughout abdomen GU:no cva tenderness, no CMT, whitish discharge noted, no vaginal bleeding noted, female chaperone present for exam NEURO: Pt is awake/alert/appropriate, moves all extremitiesx4.   SKIN: warm, color normal PSYCH: no abnormalities of mood noted, alert and oriented to situation   ED Course  Procedures   DIAGNOSTIC STUDIES: Oxygen Saturation is 99% on RA, normal by my interpretation.    COORDINATION OF CARE: 9:33 PM Will order labs and pelvic exam. Discussed treatment plan with pt at bedside and pt agreed to plan. 10:46 PM Will treat for BV Advised to avoid all sexual activity until symptoms resolved and tests are negative   Labs Review Labs Reviewed  WET PREP, GENITAL - Abnormal; Notable for the following:    Clue Cells Wet Prep HPF POC MODERATE (*)    WBC, Wet Prep HPF POC FEW (*)    All other components within normal limits  URINALYSIS, ROUTINE W REFLEX MICROSCOPIC (NOT AT [facility]) - Abnormal; Notable for the following:    APPearance CLOUDY (*)    All other components within normal limits  CBC WITH DIFFERENTIAL/PLATELET - Abnormal; Notable for the following:  Neutrophils Relative % 35 (*)    Lymphocytes Relative 51 (*)    All other components within normal limits  COMPREHENSIVE METABOLIC PANEL - Abnormal; Notable for the following:    ALT 10 (*)    All other components within normal limits  POC URINE PREG, ED  GC/CHLAMYDIA PROBE AMP ([place]) NOT AT [facility]    MDM   Final diagnoses:  Bacterial vaginosis    Nursing notes including past medical history and social history reviewed and considered in documentation Labs/vital reviewed myself and considered during  evaluation   I personally performed the services described in this documentation, which was scribed in my presence. The recorded information has been reviewed and is accurate.      Ripley Fraise, MD 03/27/15 [phone]

## 2015-03-28 LAB — GC/CHLAMYDIA PROBE AMP (~~LOC~~) NOT AT ARMC
Chlamydia: NEGATIVE
Neisseria Gonorrhea: NEGATIVE

## 2015-07-18 ENCOUNTER — Encounter (HOSPITAL_COMMUNITY): Payer: Self-pay | Admitting: Emergency Medicine

## 2015-07-18 ENCOUNTER — Emergency Department (HOSPITAL_COMMUNITY): Payer: Medicaid Other

## 2015-07-18 ENCOUNTER — Emergency Department (HOSPITAL_COMMUNITY)
Admission: EM | Admit: 2015-07-18 | Discharge: 2015-07-18 | Disposition: A | Discharge status: Home / Self Care | Payer: Medicaid Other | Attending: Emergency Medicine | Admitting: Emergency Medicine

## 2015-07-18 ENCOUNTER — Emergency Department (HOSPITAL_COMMUNITY)
Admission: EM | Admit: 2015-07-18 | Discharge: 2015-07-18 | Disposition: A | Discharge status: Home / Self Care | Payer: Medicaid Other | Source: Home / Self Care | Attending: Emergency Medicine | Admitting: Emergency Medicine

## 2015-07-18 ENCOUNTER — Other Ambulatory Visit: Payer: Self-pay

## 2015-07-18 DIAGNOSIS — R079 Chest pain, unspecified: Secondary | ICD-10-CM | POA: Diagnosis present

## 2015-07-18 DIAGNOSIS — R0789 Other chest pain: Secondary | ICD-10-CM | POA: Diagnosis not present

## 2015-07-18 DIAGNOSIS — F17210 Nicotine dependence, cigarettes, uncomplicated: Secondary | ICD-10-CM | POA: Insufficient documentation

## 2015-07-18 DIAGNOSIS — R0602 Shortness of breath: Secondary | ICD-10-CM | POA: Diagnosis not present

## 2015-07-18 LAB — BASIC METABOLIC PANEL
Anion gap: 5 (ref 5–15)
Anion gap: 5 (ref 5–15)
BUN: 8 mg/dL (ref 6–20)
BUN: 6 mg/dL (ref 6–20)
CALCIUM: 9.3 mg/dL (ref 8.9–10.3)
CO2: 26 mmol/L (ref 22–32)
CO2: 29 mmol/L (ref 22–32)
CREATININE: 0.78 mg/dL (ref 0.44–1.00)
Calcium: 9.1 mg/dL (ref 8.9–10.3)
Chloride: 107 mmol/L (ref 101–111)
Chloride: 103 mmol/L (ref 101–111)
Creatinine, Ser: 0.83 mg/dL (ref 0.44–1.00)
GFR calc Af Amer: >60 mL/min (ref >60)
GFR calc non Af Amer: >60 mL/min (ref >60)
Glucose, Bld: 87 mg/dL (ref 65–99)
Glucose, Bld: 102 mg/dL — ABNORMAL HIGH (ref 65–99)
Potassium: 4 mmol/L (ref 3.5–5.1)
Potassium: 3.5 mmol/L (ref 3.5–5.1)
SODIUM: 138 mmol/L (ref 135–145)
SODIUM: 137 mmol/L (ref 135–145)

## 2015-07-18 LAB — CBC
HCT: 36.1 % (ref 36.0–46.0)
HEMATOCRIT: 35.6 % — AB (ref 36.0–46.0)
Hemoglobin: 11.8 g/dL — ABNORMAL LOW (ref 12.0–15.0)
Hemoglobin: 11.7 g/dL — ABNORMAL LOW (ref 12.0–15.0)
MCH: 30.9 pg (ref 26.0–34.0)
MCH: 30.3 pg (ref 26.0–34.0)
MCHC: 33.1 g/dL (ref 30.0–36.0)
MCHC: 32.4 g/dL (ref 30.0–36.0)
MCV: 93.2 fL (ref 78.0–100.0)
MCV: 93.5 fL (ref 78.0–100.0)
PLATELETS: 259 10*3/uL (ref 150–400)
Platelets: 256 10*3/uL (ref 150–400)
RBC: 3.82 MIL/uL — AB (ref 3.87–5.11)
RBC: 3.86 MIL/uL — AB (ref 3.87–5.11)
RDW: 13 % (ref 11.5–15.5)
RDW: 13.1 % (ref 11.5–15.5)
WBC: 10.1 10*3/uL (ref 4.0–10.5)
WBC: 8.6 10*3/uL (ref 4.0–10.5)

## 2015-07-18 LAB — I-STAT BETA HCG BLOOD, ED (MC, WL, AP ONLY): I-stat hCG, quantitative: <5 m[IU]/mL (ref <5)

## 2015-07-18 LAB — I-STAT TROPONIN, ED
TROPONIN I, POC: 0.01 ng/mL (ref 0.00–0.08)
Troponin i, poc: 0 ng/mL (ref 0.00–0.08)

## 2015-07-18 MED ORDER — IBUPROFEN 800 MG PO TABS: 800.0000 mg | ORAL_TABLET | Freq: Three times a day (TID) | ORAL | Status: DC

## 2015-07-18 MED ORDER — KETOROLAC TROMETHAMINE 30 MG/ML IJ SOLN
30.0000 mg | Freq: Once | INTRAMUSCULAR | Status: AC
  Administered 2015-07-18: 30 mg via INTRAVENOUS
  Filled 2015-07-18: qty 1

## 2015-07-18 NOTE — Discharge Instructions (Signed)
Take your medications as prescribed as needed for pain relief. Follow-up with your primary care provider at your scheduled appointment. Return to the emergency department if symptoms worsen or new onset of fever, difficulty breathing, coughing up blood, vomiting, abdominal pain, syncope, seizure.

## 2015-07-18 NOTE — ED Notes (Signed)
Pt. reports left chest pain onset this evening with SOB , denies cough , no emesis or diaphoresis .

## 2015-07-18 NOTE — ED Notes (Signed)
Pt left with all belongings and ambulated out of the treatment area.  

## 2015-07-18 NOTE — ED Notes (Signed)
Pts name called for xray no answer. Registration notified that she saw pt get in a car and leave

## 2015-07-18 NOTE — ED Notes (Signed)
The patient was extremely upset.  She said she was not going to wait any longer because she had already waited a long time.  I advised her that the longest wait was 4:00 hours and her wait time was 2:00hours.  She decided to leave after being triaged.

## 2015-07-18 NOTE — ED Notes (Signed)
PT walked up to registration desk and reported she left last night AMA . Pt wants to check in with CP now.

## 2015-07-18 NOTE — ED Provider Notes (Signed)
CSN: [id number]     Arrival date & time 07/18/15  1415 History   First MD Initiated Contact with Patient 07/18/15 [place]     Chief Complaint  Patient presents with  . Chest Pain     (Consider location/radiation/quality/duration/timing/severity/associated sxs/prior Treatment) HPI   Patient is a 24 year old female who presents to the ED with complaint of chest pain, onset last night. Patient reports having constant sharp midsternal chest pain, denies radiation of pain. She states the pain is aggravated with coughing, deep breathing or twisting/turning. She reports having mild shortness of breath due to pain. Denies fever, chills, headache, diaphoresis, cough, wheezing abdominal pain, nausea, vomiting, numbness, tingling, weakness, leg swelling. Patient denies any recent fall or trauma but notes that she has recently been lifting heavy packs of soda and often picks up children she is caring for at home. Patient reports she has had similar pain in the past and notes that it typically will go away on its own. She denies taking any medications prior to arrival.  Past Medical History  Diagnosis Date  . No pertinent past medical history    Past Surgical History  Procedure Laterality Date  . Clavicle surgery     Family History  Problem Relation Age of Onset  . Diabetes Father   . Hypertension Father   . Cancer Paternal Uncle   . Cancer Paternal Grandmother    Social History  Substance Use Topics  . Smoking status: Current Every Day Smoker -- 0.25 packs/day    Types: Cigarettes  . Smokeless tobacco: None  . Alcohol Use: Yes     Comment: occasionally   OB History    Gravida Para Term Preterm AB TAB SAB Ectopic Multiple Living   1 1 1             Review of Systems  Respiratory: Positive for shortness of breath.   Cardiovascular: Positive for chest pain.  All other systems reviewed and are negative.     Allergies  Review of patient's allergies indicates no known allergies.  Home  Medications   Prior to Admission medications   Medication Sig Start Date End Date Taking? Authorizing Provider  ibuprofen (ADVIL,MOTRIN) 800 MG tablet Take 1 tablet (800 mg total) by mouth 3 (three) times daily. 07/18/15   Nona Dell, PA-C  metroNIDAZOLE (FLAGYL) 500 MG tablet Take 1 tablet (500 mg total) by mouth 2 (two) times daily. One po bid x 7 days 03/27/15   Ripley Fraise, MD   BP 96/80 mmHg  Pulse 77  Temp(Src) 98.5 F (36.9 C) (Oral)  Resp 25  SpO2 98%  LMP 06/27/2015 (Exact Date) Physical Exam  Constitutional: She is oriented to person, place, and time. She appears well-developed and well-nourished. No distress.  HENT:  Head: Normocephalic and atraumatic.  Mouth/Throat: Oropharynx is clear and moist. No oropharyngeal exudate.  Eyes: Conjunctivae and EOM are normal. Right eye exhibits no discharge. Left eye exhibits no discharge. No scleral icterus.  Neck: Normal range of motion. Neck supple.  Cardiovascular: Normal rate, regular rhythm, normal heart sounds and intact distal pulses.   No murmur heard. Pulmonary/Chest: Effort normal and breath sounds normal. No respiratory distress. She has no wheezes. She has no rales. She exhibits tenderness (midsternal and left chest wall TTP).  Abdominal: Soft. Bowel sounds are normal. She exhibits no distension and no mass. There is no tenderness. There is no rebound and no guarding.  Musculoskeletal: Normal range of motion. She exhibits no edema.  Neurological: She is  alert and oriented to person, place, and time.  Skin: Skin is warm and dry. She is not diaphoretic.  Nursing note and vitals reviewed.   ED Course  Procedures (including critical care time) Labs Review Labs Reviewed  BASIC METABOLIC PANEL - Abnormal; Notable for the following:    Glucose, Bld 102 (*)    All other components within normal limits  CBC - Abnormal; Notable for the following:    RBC 3.86 (*)    Hemoglobin 11.7 (*)    All other components  within normal limits  Randolm Idol, ED    Imaging Review Dg Chest 2 View  07/18/2015  CLINICAL DATA:  24 year old female with chest pain for 2 days. Initial encounter. EXAM: CHEST  2 VIEW COMPARISON:  None. FINDINGS: Lung volumes are within normal limits. Normal cardiac size and mediastinal contours. Visualized tracheal air column is within normal limits. The lungs are clear. No pneumothorax or effusion. Minimal dextro convex spinal curvature. No acute osseous abnormality identified. IMPRESSION: Negative, no acute cardiopulmonary abnormality. Electronically Signed   By: Genevie Ann M.D.   On: 07/18/2015 14:47   I have personally reviewed and evaluated these images and lab results as part of my medical decision-making.   EKG Interpretation None     Filed Vitals:   07/18/15 1900 07/18/15 1922  BP: 106/71 96/80  Pulse: 77 77  Temp:  98.5 F (36.9 C)  Resp: 21 25     MDM   Final diagnoses:  Chest wall pain    Pt presents with midsternal chest pain. She notes she has had similar chest pain in the past that has resolved on its own. VSS. Exam revealed tenderness to midsternal and left chest wall, exam otherwise unremarkable. EKG showed normal sinus rhythm. Chest x-ray negative. Troponin negative. Labs unremarkable. Patient given NSAIDs in the ED. I have a low suspicion for ACS, PE, dissection, or other acute cardiac event at this time. I suspect patient's symptoms are likely due to musculoskeletal chest wall etiology. Patient reports her pain has improved in the ED. Plan to discharge patient home with NSAIDs. Patient advised to follow-up with her PCP at her scheduled appointment next week.  Evaluation does not show pathology requring ongoing emergent intervention or admission. Pt is hemodynamically stable and mentating appropriately. Discussed findings/results and plan with patient/guardian, who agrees with plan. All questions answered. Return precautions discussed and outpatient follow up  given.        Chesley Noon [place], [place] [id number]  Delora Fuel, MD [id number]

## 2015-07-18 NOTE — ED Notes (Signed)
Pt states chest pain started yesterday - midsternal- radiates to back. Hurts to breath, hurts to move. Pt in no acute distress at present

## 2015-11-27 ENCOUNTER — Inpatient Hospital Stay (HOSPITAL_COMMUNITY)
Admission: AD | Admit: 2015-11-27 | Discharge: 2015-11-27 | Disposition: A | Discharge status: Home / Self Care | Payer: Medicaid Other | Source: Ambulatory Visit | Attending: Obstetrics | Admitting: Obstetrics

## 2015-11-27 ENCOUNTER — Encounter (HOSPITAL_COMMUNITY): Payer: Self-pay | Admitting: *Deleted

## 2015-11-27 DIAGNOSIS — A499 Bacterial infection, unspecified: Secondary | ICD-10-CM

## 2015-11-27 DIAGNOSIS — R1030 Lower abdominal pain, unspecified: Secondary | ICD-10-CM | POA: Diagnosis present

## 2015-11-27 DIAGNOSIS — F17210 Nicotine dependence, cigarettes, uncomplicated: Secondary | ICD-10-CM | POA: Diagnosis not present

## 2015-11-27 DIAGNOSIS — B9689 Other specified bacterial agents as the cause of diseases classified elsewhere: Secondary | ICD-10-CM

## 2015-11-27 DIAGNOSIS — N760 Acute vaginitis: Secondary | ICD-10-CM | POA: Diagnosis not present

## 2015-11-27 LAB — URINALYSIS, ROUTINE W REFLEX MICROSCOPIC
BILIRUBIN URINE: NEGATIVE
GLUCOSE, UA: NEGATIVE mg/dL
HGB URINE DIPSTICK: NEGATIVE
Ketones, ur: NEGATIVE mg/dL
Leukocytes, UA: NEGATIVE
NITRITE: NEGATIVE
PH: 5.5 (ref 5.0–8.0)
Protein, ur: NEGATIVE mg/dL

## 2015-11-27 LAB — POCT PREGNANCY, URINE: PREG TEST UR: NEGATIVE

## 2015-11-27 LAB — WET PREP, GENITAL
SPERM: NONE SEEN
Trich, Wet Prep: NONE SEEN
Yeast Wet Prep HPF POC: NONE SEEN

## 2015-11-27 MED ORDER — METRONIDAZOLE 500 MG PO TABS: 500.0000 mg | ORAL_TABLET | Freq: Two times a day (BID) | ORAL | Status: DC

## 2015-11-27 NOTE — MAU Provider Note (Signed)
History     CSN: [id number]  Arrival date and time: 11/27/15 2139   First Provider Initiated Contact with Patient 11/27/15 2224      Chief Complaint  Patient presents with  . Abdominal Pain   HPI [name] is a 25 y.o. G1P1000 who presents to MAU today with complaint of lower abdominal pain and vaginal discharge with odor x 2 weeks. The patient rates pain at 7/10 now. She has not taken anything for pain. She denies vaginal bleeding, fever, UTI symptoms, N/V/D or constipation. She endorses a thick, white discharge with foul odor. She is sexually active and had unprotected intercourse with a new partner last week.    OB History    Gravida Para Term Preterm AB TAB SAB Ectopic Multiple Living   1 1 1              Past Medical History  Diagnosis Date  . No pertinent past medical history     Past Surgical History  Procedure Laterality Date  . Clavicle surgery      Family History  Problem Relation Age of Onset  . Diabetes Father   . Hypertension Father   . Cancer Paternal Uncle   . Cancer Paternal Grandmother     Social History  Substance Use Topics  . Smoking status: Current Every Day Smoker -- 0.25 packs/day    Types: Cigarettes  . Smokeless tobacco: None  . Alcohol Use: Yes     Comment: occasionally    Allergies: No Known Allergies  Prescriptions prior to admission  Medication Sig Dispense Refill Last Dose  . ibuprofen (ADVIL,MOTRIN) 800 MG tablet Take 1 tablet (800 mg total) by mouth 3 (three) times daily. 21 tablet 0   . [DISCONTINUED] metroNIDAZOLE (FLAGYL) 500 MG tablet Take 1 tablet (500 mg total) by mouth 2 (two) times daily. One po bid x 7 days 14 tablet 0     Review of Systems  Constitutional: Negative for fever and malaise/fatigue.  Gastrointestinal: Positive for abdominal pain. Negative for nausea, vomiting, diarrhea and constipation.  Genitourinary: Negative for dysuria, urgency and frequency.       Neg - vaginal bleeding + vaginal  discharge   Physical Exam   Blood pressure 101/69, pulse 90, temperature 98.5 F (36.9 C), resp. rate 20, height 5\' 3"  (1.6 m), weight 145 lb 6 oz (65.942 kg), last menstrual period 11/20/2015, unknown if currently breastfeeding.  Physical Exam  Nursing note and vitals reviewed. Constitutional: She is oriented to person, place, and time. She appears well-developed and well-nourished. No distress.  HENT:  Head: Normocephalic and atraumatic.  Cardiovascular: Normal rate.   Respiratory: Effort normal.  GI: Soft. She exhibits no distension and no mass. There is tenderness (mild suprapubic tenderness to palpation at midline). There is no rebound and no guarding.  Genitourinary: Uterus is tender (mild). Uterus is not enlarged. Cervix exhibits no motion tenderness, no discharge and no friability. Right adnexum displays no mass and no tenderness. Left adnexum displays no mass and no tenderness. No bleeding in the vagina. Vaginal discharge (scant thin, white discharge noted) found.  Neurological: She is alert and oriented to person, place, and time.  Skin: Skin is warm and dry. No erythema.  Psychiatric: She has a normal mood and affect.     Results for orders placed or performed during the hospital encounter of 11/27/15 (from the past 24 hour(s))  Urinalysis, Routine w reflex microscopic (not at [facility])     Status: Abnormal  Collection Time: 11/27/15 10:02 PM  Result Value Ref Range   Color, Urine YELLOW YELLOW   APPearance CLEAR CLEAR   Specific Gravity, Urine >1.030 (H) 1.005 - 1.030   pH 5.5 5.0 - 8.0   Glucose, UA NEGATIVE NEGATIVE mg/dL   Hgb urine dipstick NEGATIVE NEGATIVE   Bilirubin Urine NEGATIVE NEGATIVE   Ketones, ur NEGATIVE NEGATIVE mg/dL   Protein, ur NEGATIVE NEGATIVE mg/dL   Nitrite NEGATIVE NEGATIVE   Leukocytes, UA NEGATIVE NEGATIVE  Pregnancy, urine POC     Status: None   Collection Time: 11/27/15 10:16 PM  Result Value Ref Range   Preg Test, Ur NEGATIVE NEGATIVE   Wet prep, genital     Status: Abnormal   Collection Time: 11/27/15 10:30 PM  Result Value Ref Range   Yeast Wet Prep HPF POC NONE SEEN NONE SEEN   Trich, Wet Prep NONE SEEN NONE SEEN   Clue Cells Wet Prep HPF POC PRESENT (A) NONE SEEN   WBC, Wet Prep HPF POC MODERATE (A) NONE SEEN   Sperm NONE SEEN     MAU Course  Procedures None  MDM UPT - negative UA, Wet prep, GC/Chlamdyia, RPR and HIV today   Assessment and Plan  A: Bacterial vaginosis  P: Discharge home Rx for Flagyl given to patient  Patient advised to follow-up with Dr. Ruthann Cancer as scheduled or sooner if symptoms persist or worsen Patient may return to MAU as needed or if her condition were to change or worsen  Luvenia Redden, PA-C  11/27/2015, 10:59 PM

## 2015-11-27 NOTE — MAU Note (Signed)
PT SAYS SHE HAS LOWER   ABD  PAIN   X2 WEEKS-       AND  HAS FOUL ODOR  D/C-   THAT   STARTED  ON   4-5        NO BIRTH CONTROL.     LAST SEX-  LAST WEEK.    NO HPT.

## 2015-11-27 NOTE — Discharge Instructions (Signed)

## 2015-11-28 LAB — RPR: RPR: NONREACTIVE

## 2015-11-28 LAB — GC/CHLAMYDIA PROBE AMP (~~LOC~~) NOT AT ARMC
CHLAMYDIA, DNA PROBE: NEGATIVE
Neisseria Gonorrhea: NEGATIVE

## 2015-11-28 LAB — HIV ANTIBODY (ROUTINE TESTING W REFLEX): HIV SCREEN 4TH GENERATION: NONREACTIVE

## 2015-12-16 ENCOUNTER — Inpatient Hospital Stay (HOSPITAL_COMMUNITY)
Admission: AD | Admit: 2015-12-16 | Discharge: 2015-12-16 | Disposition: A | Discharge status: Home / Self Care | Payer: Medicaid Other | Source: Ambulatory Visit | Attending: Obstetrics | Admitting: Obstetrics

## 2015-12-16 ENCOUNTER — Encounter (HOSPITAL_COMMUNITY): Payer: Self-pay

## 2015-12-16 DIAGNOSIS — F17210 Nicotine dependence, cigarettes, uncomplicated: Secondary | ICD-10-CM | POA: Diagnosis not present

## 2015-12-16 DIAGNOSIS — B373 Candidiasis of vulva and vagina: Secondary | ICD-10-CM

## 2015-12-16 DIAGNOSIS — Z809 Family history of malignant neoplasm, unspecified: Secondary | ICD-10-CM | POA: Insufficient documentation

## 2015-12-16 DIAGNOSIS — N898 Other specified noninflammatory disorders of vagina: Secondary | ICD-10-CM | POA: Diagnosis present

## 2015-12-16 DIAGNOSIS — Z833 Family history of diabetes mellitus: Secondary | ICD-10-CM | POA: Insufficient documentation

## 2015-12-16 DIAGNOSIS — B379 Candidiasis, unspecified: Secondary | ICD-10-CM | POA: Diagnosis not present

## 2015-12-16 DIAGNOSIS — B3731 Acute candidiasis of vulva and vagina: Secondary | ICD-10-CM

## 2015-12-16 DIAGNOSIS — Z8249 Family history of ischemic heart disease and other diseases of the circulatory system: Secondary | ICD-10-CM | POA: Diagnosis not present

## 2015-12-16 LAB — WET PREP, GENITAL
Clue Cells Wet Prep HPF POC: NONE SEEN
Sperm: NONE SEEN
TRICH WET PREP: NONE SEEN
YEAST WET PREP: NONE SEEN

## 2015-12-16 LAB — URINALYSIS, ROUTINE W REFLEX MICROSCOPIC
Bilirubin Urine: NEGATIVE
Glucose, UA: NEGATIVE mg/dL
Hgb urine dipstick: NEGATIVE
KETONES UR: NEGATIVE mg/dL
Nitrite: NEGATIVE
PROTEIN: NEGATIVE mg/dL
Specific Gravity, Urine: >1.03 — ABNORMAL HIGH (ref 1.005–1.030)
pH: 5.5 (ref 5.0–8.0)

## 2015-12-16 LAB — URINE MICROSCOPIC-ADD ON: RBC / HPF: NONE SEEN RBC/hpf (ref 0–5)

## 2015-12-16 LAB — POCT PREGNANCY, URINE: PREG TEST UR: NEGATIVE

## 2015-12-16 MED ORDER — TERCONAZOLE 0.4 % VA CREA: 1.0000 | TOPICAL_CREAM | Freq: Every day | VAGINAL | Status: DC

## 2015-12-16 NOTE — Discharge Instructions (Signed)
Get medication sent to pharmacy and use vaginally at night for 7 days. See Dr. Ruthann Cancer if symptoms do not resolve. Drink at least 8 8-oz glasses of water every day.

## 2015-12-16 NOTE — MAU Note (Addendum)
Patient presents with vaginal itching and discharge. Was treated for BV towards end of treatment noticed itching.

## 2015-12-16 NOTE — MAU Provider Note (Signed)
History     CSN: [id number]  Arrival date and time: 12/16/15 0901   First Provider Initiated Contact with Patient 12/16/15 0930      Chief Complaint  Patient presents with  . Vaginal Discharge   HPI Angela Cortez 25 y.o. comes to MAU with increased thick and itchy vaginal discharge.  Took treatment for BV 2 weeks ago and now has this itching and discharge.  Previous note and labs reviewed.  OB History    Gravida Para Term Preterm AB TAB SAB Ectopic Multiple Living   1 1 1       1       Past Medical History  Diagnosis Date  . No pertinent past medical history     Past Surgical History  Procedure Laterality Date  . Clavicle surgery      Family History  Problem Relation Age of Onset  . Diabetes Father   . Hypertension Father   . Cancer Paternal Uncle   . Cancer Paternal Grandmother     Social History  Substance Use Topics  . Smoking status: Current Every Day Smoker -- 0.25 packs/day    Types: Cigarettes  . Smokeless tobacco: None  . Alcohol Use: Yes     Comment: occasionally    Allergies: No Known Allergies  Prescriptions prior to admission  Medication Sig Dispense Refill Last Dose  . ibuprofen (ADVIL,MOTRIN) 800 MG tablet Take 1 tablet (800 mg total) by mouth 3 (three) times daily. (Patient not taking: Reported on 12/16/2015) 21 tablet 0   . metroNIDAZOLE (FLAGYL) 500 MG tablet Take 1 tablet (500 mg total) by mouth 2 (two) times daily. One po bid x 7 days (Patient not taking: Reported on 12/16/2015) 14 tablet 0     Review of Systems  Constitutional: Negative for fever.  Gastrointestinal: Negative for nausea, vomiting and abdominal pain.  Genitourinary:       Vaginal discharge. Vaginal itching No vaginal bleeding. No dysuria.   Physical Exam   Blood pressure 110/71, pulse 96, temperature 98.5 F (36.9 C), resp. rate 18, height 5\' 3"  (1.6 m), last menstrual period 11/20/2015, unknown if currently breastfeeding.  Physical Exam  Nursing note and  vitals reviewed. Constitutional: She is oriented to person, place, and time. She appears well-developed and well-nourished.  HENT:  Head: Normocephalic.  Eyes: EOM are normal.  Neck: Neck supple.  Respiratory: Effort normal.  Musculoskeletal: Normal range of motion.  Neurological: She is alert and oriented to person, place, and time.  Skin: Skin is warm and dry.  Psychiatric: She has a normal mood and affect.    MAU Course  Procedures Results for orders placed or performed during the hospital encounter of 12/16/15 (from the past 24 hour(s))  Urinalysis, Routine w reflex microscopic (not at [facility])     Status: Abnormal   Collection Time: 12/16/15  9:12 AM  Result Value Ref Range   Color, Urine YELLOW YELLOW   APPearance HAZY (A) CLEAR   Specific Gravity, Urine >1.030 (H) 1.005 - 1.030   pH 5.5 5.0 - 8.0   Glucose, UA NEGATIVE NEGATIVE mg/dL   Hgb urine dipstick NEGATIVE NEGATIVE   Bilirubin Urine NEGATIVE NEGATIVE   Ketones, ur NEGATIVE NEGATIVE mg/dL   Protein, ur NEGATIVE NEGATIVE mg/dL   Nitrite NEGATIVE NEGATIVE   Leukocytes, UA LARGE (A) NEGATIVE  Urine microscopic-add on     Status: Abnormal   Collection Time: 12/16/15  9:12 AM  Result Value Ref Range   Squamous Epithelial / LPF 6-30 (  A) NONE SEEN   WBC, UA 6-30 0 - 5 WBC/hpf   RBC / HPF NONE SEEN 0 - 5 RBC/hpf   Bacteria, UA FEW (A) NONE SEEN  Pregnancy, urine POC     Status: None   Collection Time: 12/16/15  9:18 AM  Result Value Ref Range   Preg Test, Ur NEGATIVE NEGATIVE  Wet prep, genital     Status: Abnormal   Collection Time: 12/16/15  9:30 AM  Result Value Ref Range   Yeast Wet Prep HPF POC NONE SEEN NONE SEEN   Trich, Wet Prep NONE SEEN NONE SEEN   Clue Cells Wet Prep HPF POC NONE SEEN NONE SEEN   WBC, Wet Prep HPF POC FEW (A) NONE SEEN   Sperm NONE SEEN     MDM Treated with antibiotics 2 weeks ago and then has had an itchy vaginal discharge with thick white discharge.  Even though wet prep did not show  yeast, will treat as a yeast infection.    Assessment and Plan  Yeast infection  Plan Get medication sent to pharmacy and use vaginally at night for 7 days. See Dr. Ruthann Cancer if symptoms do not resolve. Drink at least 8 8-oz glasses of water every day.   BURLESON,TERRI 12/16/2015, 9:53 AM

## 2016-03-02 ENCOUNTER — Inpatient Hospital Stay (HOSPITAL_COMMUNITY)
Admission: AD | Admit: 2016-03-02 | Discharge: 2016-03-02 | Disposition: A | Discharge status: Home / Self Care | Payer: Medicaid Other | Source: Ambulatory Visit | Attending: Family Medicine | Admitting: Family Medicine

## 2016-03-02 DIAGNOSIS — N912 Amenorrhea, unspecified: Secondary | ICD-10-CM | POA: Diagnosis not present

## 2016-03-02 DIAGNOSIS — Z3200 Encounter for pregnancy test, result unknown: Secondary | ICD-10-CM | POA: Insufficient documentation

## 2016-03-02 NOTE — MAU Provider Note (Signed)
[name] is a 25 y.o. G1P1001 at Unknown who presents to MAU today for pregnancy verification. The patient denies abdominal pain or vaginal bleeding today.   BP 108/68 mmHg  Pulse 80  Temp(Src) 97.9 F (36.6 C) (Oral)  Resp 18  Ht 5\' 4"  (1.626 m)  Wt 65.318 kg (144 lb)  BMI 24.71 kg/m2  CONSTITUTIONAL: Well-developed, well-nourished female in no acute distress.  CARDIOVASCULAR: Regular heart rate RESPIRATORY: Normal effort NEUROLOGICAL: Alert and oriented to person, place, and time.  SKIN: Skin is warm and dry. No rash noted. Not diaphoretic. No erythema. No pallor. PSYCH: Normal mood and affect. Normal behavior. Normal judgment and thought content.  MDM Medical Screen Exam Complete  A: Amenorrhea  P: Discharge from MAU Patient advised to follow-up with [facility] for pregnancy confirmation  Monday-Thursday 8am-4pm or Friday 8am-11am Patient may return to MAU as needed or if her condition were to change or worsen   Julianne Handler, CNM  03/02/2016 11:58 AM

## 2016-03-02 NOTE — MAU Note (Signed)
Pt states that she took a pregnancy test yesterday which came back positive. Pt states that her only complaint is feeling nauseated. [place]

## 2016-03-04 ENCOUNTER — Encounter: Payer: Self-pay | Admitting: Family Medicine

## 2016-03-04 ENCOUNTER — Ambulatory Visit (INDEPENDENT_AMBULATORY_CARE_PROVIDER_SITE_OTHER): Payer: Medicaid Other

## 2016-03-04 DIAGNOSIS — Z3200 Encounter for pregnancy test, result unknown: Secondary | ICD-10-CM

## 2016-03-04 DIAGNOSIS — Z3201 Encounter for pregnancy test, result positive: Secondary | ICD-10-CM | POA: Diagnosis present

## 2016-03-04 LAB — POCT PREGNANCY, URINE: Preg Test, Ur: POSITIVE — AB

## 2016-03-04 MED ORDER — PRENATAL VITAMINS PLUS 27-1 MG PO TABS: 1.0000 | ORAL_TABLET | Freq: Once | ORAL | 2 refills | Status: AC

## 2016-03-04 NOTE — Progress Notes (Signed)
Patient presented today for possible pregnancy test. Test confirms she is pregnant around 6 weeks. Medication has been reviewed and patient has been advised to start taking prenatal vitamins. Patient verbalizes understanding at this time

## 2016-03-05 ENCOUNTER — Emergency Department (HOSPITAL_COMMUNITY)
Admission: EM | Admit: 2016-03-05 | Discharge: 2016-03-05 | Disposition: A | Discharge status: Home / Self Care | Payer: Medicaid Other | Attending: Emergency Medicine | Admitting: Emergency Medicine

## 2016-03-05 ENCOUNTER — Encounter (HOSPITAL_COMMUNITY): Payer: Self-pay | Admitting: Emergency Medicine

## 2016-03-05 DIAGNOSIS — Z87891 Personal history of nicotine dependence: Secondary | ICD-10-CM | POA: Diagnosis not present

## 2016-03-05 DIAGNOSIS — O219 Vomiting of pregnancy, unspecified: Secondary | ICD-10-CM | POA: Insufficient documentation

## 2016-03-05 DIAGNOSIS — Z3A01 Less than 8 weeks gestation of pregnancy: Secondary | ICD-10-CM | POA: Insufficient documentation

## 2016-03-05 DIAGNOSIS — Z79899 Other long term (current) drug therapy: Secondary | ICD-10-CM | POA: Insufficient documentation

## 2016-03-05 DIAGNOSIS — R112 Nausea with vomiting, unspecified: Secondary | ICD-10-CM | POA: Diagnosis present

## 2016-03-05 LAB — URINALYSIS, ROUTINE W REFLEX MICROSCOPIC
Bilirubin Urine: NEGATIVE
Glucose, UA: NEGATIVE mg/dL
HGB URINE DIPSTICK: NEGATIVE
Ketones, ur: NEGATIVE mg/dL
NITRITE: NEGATIVE
PROTEIN: NEGATIVE mg/dL
SPECIFIC GRAVITY, URINE: 1.031 — AB (ref 1.005–1.030)
pH: 6 (ref 5.0–8.0)

## 2016-03-05 LAB — URINE MICROSCOPIC-ADD ON

## 2016-03-05 LAB — CBC
HCT: 37.6 % (ref 36.0–46.0)
HEMOGLOBIN: 13 g/dL (ref 12.0–15.0)
MCH: 31.1 pg (ref 26.0–34.0)
MCHC: 34.6 g/dL (ref 30.0–36.0)
MCV: 90 fL (ref 78.0–100.0)
PLATELETS: 277 10*3/uL (ref 150–400)
RBC: 4.18 MIL/uL (ref 3.87–5.11)
RDW: 12.3 % (ref 11.5–15.5)
WBC: 7 10*3/uL (ref 4.0–10.5)

## 2016-03-05 LAB — COMPREHENSIVE METABOLIC PANEL
ALK PHOS: 46 U/L (ref 38–126)
ALT: 11 U/L — ABNORMAL LOW (ref 14–54)
ANION GAP: 8 (ref 5–15)
AST: 20 U/L (ref 15–41)
Albumin: 4.8 g/dL (ref 3.5–5.0)
BUN: 8 mg/dL (ref 6–20)
CALCIUM: 9.5 mg/dL (ref 8.9–10.3)
CO2: 23 mmol/L (ref 22–32)
CREATININE: 0.7 mg/dL (ref 0.44–1.00)
Chloride: 102 mmol/L (ref 101–111)
Glucose, Bld: 94 mg/dL (ref 65–99)
Potassium: 3.7 mmol/L (ref 3.5–5.1)
SODIUM: 133 mmol/L — AB (ref 135–145)
Total Bilirubin: 1.1 mg/dL (ref 0.3–1.2)
Total Protein: 8.4 g/dL — ABNORMAL HIGH (ref 6.5–8.1)

## 2016-03-05 MED ORDER — METOCLOPRAMIDE HCL 10 MG PO TABS: 10.0000 mg | ORAL_TABLET | Freq: Four times a day (QID) | ORAL | 0 refills | Status: DC

## 2016-03-05 MED ORDER — SODIUM CHLORIDE 0.9 % IV BOLUS (SEPSIS)
1000.0000 mL | Freq: Once | INTRAVENOUS | Status: AC
  Administered 2016-03-05: 1000 mL via INTRAVENOUS

## 2016-03-05 MED ORDER — METOCLOPRAMIDE HCL 5 MG/ML IJ SOLN
10.0000 mg | Freq: Once | INTRAMUSCULAR | Status: AC
  Administered 2016-03-05: 10 mg via INTRAVENOUS
  Filled 2016-03-05: qty 2

## 2016-03-05 NOTE — ED Notes (Signed)
Pt actively vomiting.

## 2016-03-05 NOTE — Discharge Instructions (Signed)
Take your medications as prescribed as an for nausea and vomiting. Continue to drink fluids at home to remain hydrated. Follow-up with your OB/GYN at your scheduled appointment. Return to the emergency department if symptoms worsen or new onset of fever, abdominal pain, vomiting, unable to keep fluids down, vaginal bleeding, difficulty breathing.

## 2016-03-05 NOTE — ED Provider Notes (Signed)
[facility] DEPT Provider Note   CSN: [id number] Arrival date & time: 03/05/16  1646  First Provider Contact:  First MD Initiated Contact with Patient 03/05/16 1741        History   Chief Complaint Chief Complaint  Patient presents with  . Emesis During Pregnancy    HPI Angela Cortez is a 25 y.o. female.  Patient is a 25 year old female with no pertinent past medical history presents the ED with complaint of nausea and vomiting, onset 4 days. Pt reports she found out earlier this week that she recently found out she was pregnant (appx. 6 weeks), LMP 01/22/11. She notes she has an appointment with OBGYN on 04/08/16. Pt reports she has had nausea with vomiting for the past few days and and unable to keep down any fluids at home. She reports having one episode of vomiting with small red streaks today. Denies fever, chills, cough, shortness of breath, chest pain, hematemesis, abdominal pain, diarrhea, urinary symptoms, vaginal bleeding, vaginal discharge. G2P1A0. Patient denies taking any medications at home.      Past Medical History:  Diagnosis Date  . No pertinent past medical history     Patient Active Problem List   Diagnosis Date Noted  . Normal delivery 05/17/2012    Past Surgical History:  Procedure Laterality Date  . CLAVICLE SURGERY      OB History    Gravida Para Term Preterm AB Living   2 1 1     1    SAB TAB Ectopic Multiple Live Births                   Home Medications    Prior to Admission medications   Medication Sig Start Date End Date Taking? Authorizing Provider  Prenatal MV & Min w/FA-DHA (PRENATAL ADULT GUMMY/DHA/FA PO) Take 2 tablets by mouth daily.   Yes Historical Provider, MD  metoCLOPramide (REGLAN) 10 MG tablet Take 1 tablet (10 mg total) by mouth every 6 (six) hours. 03/05/16   Nona Dell, PA-C    Family History Family History  Problem Relation Age of Onset  . Diabetes Father   . Hypertension Father   . Cancer  Paternal Uncle   . Cancer Paternal Grandmother     Social History Social History  Substance Use Topics  . Smoking status: Former Smoker    Packs/day: 0.25    Types: Cigarettes    Quit date: 02/04/2016  . Smokeless tobacco: Never Used  . Alcohol use Yes     Comment: occasionally     Allergies   Review of patient's allergies indicates no known allergies.   Review of Systems Review of Systems  Gastrointestinal: Positive for nausea and vomiting.  All other systems reviewed and are negative.    Physical Exam Updated Vital Signs BP 105/65 (BP Location: Right Arm)   Pulse 73   Temp 98 F (36.7 C) (Oral)   Resp 18   LMP 01/22/2016 (Exact Date)   SpO2 100%   Physical Exam  Constitutional: She is oriented to person, place, and time. She appears well-developed and well-nourished. No distress.  HENT:  Head: Normocephalic and atraumatic.  Mouth/Throat: Uvula is midline, oropharynx is clear and moist and mucous membranes are normal. No oropharyngeal exudate, posterior oropharyngeal edema, posterior oropharyngeal erythema or tonsillar abscesses.  Eyes: Conjunctivae and EOM are normal. Right eye exhibits no discharge. Left eye exhibits no discharge. No scleral icterus.  Neck: Normal range of motion. Neck supple.  Cardiovascular: Normal rate,  regular rhythm, normal heart sounds and intact distal pulses.   Pulmonary/Chest: Effort normal and breath sounds normal. No respiratory distress. She has no wheezes. She has no rales. She exhibits no tenderness.  Abdominal: Soft. Bowel sounds are normal. She exhibits no distension and no mass. There is no tenderness. There is no rebound and no guarding. No hernia.  Musculoskeletal: Normal range of motion. She exhibits no edema.  Neurological: She is alert and oriented to person, place, and time.  Skin: Skin is warm and dry. She is not diaphoretic.  Nursing note and vitals reviewed.    ED Treatments / Results  Labs (all labs ordered are  listed, but only abnormal results are displayed) Labs Reviewed  COMPREHENSIVE METABOLIC PANEL - Abnormal; Notable for the following:       Result Value   Sodium 133 (*)    Total Protein 8.4 (*)    ALT 11 (*)    All other components within normal limits  URINALYSIS, ROUTINE W REFLEX MICROSCOPIC (NOT AT [facility]) - Abnormal; Notable for the following:    APPearance CLOUDY (*)    Specific Gravity, Urine 1.031 (*)    Leukocytes, UA SMALL (*)    All other components within normal limits  URINE MICROSCOPIC-ADD ON - Abnormal; Notable for the following:    Squamous Epithelial / LPF 6-30 (*)    Bacteria, UA FEW (*)    All other components within normal limits  URINE CULTURE  CBC    EKG  EKG Interpretation None       Radiology No results found.  Procedures Procedures (including critical care time)  Medications Ordered in ED Medications  sodium chloride 0.9 % bolus 1,000 mL (0 mLs Intravenous Stopped 03/05/16 1956)  metoCLOPramide (REGLAN) injection 10 mg (10 mg Intravenous Given 03/05/16 1851)     Initial Impression / Assessment and Plan / ED Course  I have reviewed the triage vital signs and the nursing notes.  Pertinent labs & imaging results that were available during my care of the patient were reviewed by me and considered in my medical decision making (see chart for details).  Clinical Course    Patient presents with nausea and vomiting over the past few days, patient reports being personally [redacted] weeks pregnant. She notes she has been unable to keep any fluids down over the past 3 days. Denies fever or abdominal pain. Denies vaginal bleeding. VSS. Exam unremarkable. Patient given IV fluids and Reglan in the ED. UA showed small leuks, 6-30 epithelial cells, few bacteria; likely due to contamination but will sent urine culture for further evaluation. Labs unremarkable. On reevaluation patient reports her nausea has significantly improved. Denies any episodes of vomiting in the ED.  Patient able to tolerate PO in the ED. Discussed results and plan for discharge with patient. Advised patient to follow up with her OB/GYN at her scheduled appointment. Discussed strict return precautions with patient.  Final Clinical Impressions(s) / ED Diagnoses   Final diagnoses:  Nausea/vomiting in pregnancy    New Prescriptions Discharge Medication List as of 03/05/2016  8:03 PM    START taking these medications   Details  metoCLOPramide (REGLAN) 10 MG tablet Take 1 tablet (10 mg total) by mouth every 6 (six) hours., Starting Tue 03/05/2016, Print         Chesley Noon [place], [place] 03/06/16 [id number]    Harvel Quale, MD 03/06/16 [phone]

## 2016-03-05 NOTE — ED Triage Notes (Signed)
Pt is [redacted] wks pregnant. Pt states that she went to [facility] yesterday because she has been vomiting every day for a week and cannot keep anything down.  States that she has not eaten in 3 days. Pt states that today she vomited and saw streaks of blood.  States that she is having pain in her upper abd from vomiting so much.  Denies low abd pain.

## 2016-03-07 LAB — URINE CULTURE

## 2016-03-08 ENCOUNTER — Inpatient Hospital Stay (HOSPITAL_COMMUNITY)
Admission: AD | Admit: 2016-03-08 | Discharge: 2016-03-09 | Disposition: A | Discharge status: Home / Self Care | Payer: Medicaid Other | Source: Ambulatory Visit | Attending: Obstetrics & Gynecology | Admitting: Obstetrics & Gynecology

## 2016-03-08 ENCOUNTER — Inpatient Hospital Stay (HOSPITAL_COMMUNITY): Payer: Medicaid Other

## 2016-03-08 ENCOUNTER — Encounter: Payer: Self-pay | Admitting: Medical

## 2016-03-08 DIAGNOSIS — O468X1 Other antepartum hemorrhage, first trimester: Secondary | ICD-10-CM

## 2016-03-08 DIAGNOSIS — B9689 Other specified bacterial agents as the cause of diseases classified elsewhere: Secondary | ICD-10-CM

## 2016-03-08 DIAGNOSIS — N760 Acute vaginitis: Secondary | ICD-10-CM | POA: Insufficient documentation

## 2016-03-08 DIAGNOSIS — O418X10 Other specified disorders of amniotic fluid and membranes, first trimester, not applicable or unspecified: Secondary | ICD-10-CM

## 2016-03-08 DIAGNOSIS — O208 Other hemorrhage in early pregnancy: Secondary | ICD-10-CM | POA: Diagnosis not present

## 2016-03-08 DIAGNOSIS — O209 Hemorrhage in early pregnancy, unspecified: Secondary | ICD-10-CM

## 2016-03-08 DIAGNOSIS — Z87891 Personal history of nicotine dependence: Secondary | ICD-10-CM | POA: Insufficient documentation

## 2016-03-08 DIAGNOSIS — Z3A01 Less than 8 weeks gestation of pregnancy: Secondary | ICD-10-CM | POA: Diagnosis not present

## 2016-03-08 LAB — CBC WITH DIFFERENTIAL/PLATELET
BASOS PCT: 0 %
Basophils Absolute: 0 10*3/uL (ref 0.0–0.1)
Eosinophils Absolute: 0.1 10*3/uL (ref 0.0–0.7)
Eosinophils Relative: 1 %
HEMATOCRIT: 33.4 % — AB (ref 36.0–46.0)
HEMOGLOBIN: 11.8 g/dL — AB (ref 12.0–15.0)
LYMPHS ABS: 4 10*3/uL (ref 0.7–4.0)
LYMPHS PCT: 47 %
MCH: 30.9 pg (ref 26.0–34.0)
MCHC: 35.3 g/dL (ref 30.0–36.0)
MCV: 87.4 fL (ref 78.0–100.0)
MONO ABS: 0.4 10*3/uL (ref 0.1–1.0)
MONOS PCT: 4 %
NEUTROS ABS: 4 10*3/uL (ref 1.7–7.7)
NEUTROS PCT: 48 %
Platelets: 239 10*3/uL (ref 150–400)
RBC: 3.82 MIL/uL — ABNORMAL LOW (ref 3.87–5.11)
RDW: 12.6 % (ref 11.5–15.5)
WBC: 8.4 10*3/uL (ref 4.0–10.5)

## 2016-03-08 LAB — WET PREP, GENITAL
SPERM: NONE SEEN
TRICH WET PREP: NONE SEEN
Yeast Wet Prep HPF POC: NONE SEEN

## 2016-03-08 NOTE — MAU Note (Signed)
Pt reports intermittent  bleeding since yesterday. Pt reports that the bleeding is "like a period" Pt denies pain. Denies recent intercourse.

## 2016-03-08 NOTE — MAU Note (Signed)
Urine in lab 

## 2016-03-08 NOTE — MAU Note (Signed)
Vag bleeding since yesterday. Using 2 pads a day and blood is red. No pain

## 2016-03-08 NOTE — MAU Provider Note (Signed)
History     CSN: [id number]  Arrival date and time: 03/08/16 2204   First Provider Initiated Contact with Patient 03/08/16 2342      Chief Complaint  Patient presents with  . Vaginal Bleeding   HPI Ms. Aylah AMANNI OSTHOFF is a 25 y.o. G2P1001 at [redacted]w[redacted]d who presents to MAU today with complaint of vaginal bleeding. The patient states that bleeding has been light since last night. She denies pain, UTI symptoms, N/V/D or fever.    OB History    Gravida Para Term Preterm AB Living   2 1 1     1    SAB TAB Ectopic Multiple Live Births                  Past Medical History:  Diagnosis Date  . No pertinent past medical history     Past Surgical History:  Procedure Laterality Date  . CLAVICLE SURGERY      Family History  Problem Relation Age of Onset  . Diabetes Father   . Hypertension Father   . Cancer Paternal Uncle   . Cancer Paternal Grandmother     Social History  Substance Use Topics  . Smoking status: Former Smoker    Packs/day: 0.25    Types: Cigarettes    Quit date: 02/04/2016  . Smokeless tobacco: Never Used  . Alcohol use Yes     Comment: occasionally    Allergies: No Known Allergies  Prescriptions Prior to Admission  Medication Sig Dispense Refill Last Dose  . metoCLOPramide (REGLAN) 10 MG tablet Take 1 tablet (10 mg total) by mouth every 6 (six) hours. 30 tablet 0 03/08/2016 at Unknown time  . Prenatal MV & Min w/FA-DHA (PRENATAL ADULT GUMMY/DHA/FA PO) Take 2 tablets by mouth daily.   Past Week at Unknown time    Review of Systems  Constitutional: Negative for fever and malaise/fatigue.  Gastrointestinal: Positive for abdominal pain. Negative for constipation, diarrhea, nausea and vomiting.  Genitourinary: Negative for dysuria, frequency and urgency.       + vaginal bleeding   Physical Exam   Blood pressure 102/69, pulse 69, temperature 98.5 F (36.9 C), resp. rate 18, height 5\' 4"  (1.626 m), weight 147 lb 9.6 oz (67 kg), last menstrual period  01/22/2016, unknown if currently breastfeeding.  Physical Exam  Nursing note and vitals reviewed. Constitutional: She is oriented to person, place, and time. She appears well-developed and well-nourished. No distress.  HENT:  Head: Normocephalic and atraumatic.  Cardiovascular: Normal rate.   Respiratory: Effort normal.  GI: Soft. She exhibits no distension and no mass. There is no tenderness. There is no rebound and no guarding.  Genitourinary: Uterus is tender (mild). Uterus is not enlarged. Cervix exhibits no motion tenderness, no discharge and no friability. Right adnexum displays no mass and no tenderness. Left adnexum displays no mass and no tenderness. There is bleeding (scant blood noted in the vaginal vault) in the vagina. No vaginal discharge found.  Neurological: She is alert and oriented to person, place, and time.  Skin: Skin is warm and dry. No erythema.  Psychiatric: She has a normal mood and affect.    Results for orders placed or performed during the hospital encounter of 03/08/16 (from the past 24 hour(s))  CBC with Differential/Platelet     Status: Abnormal   Collection Time: 03/08/16 11:13 PM  Result Value Ref Range   WBC 8.4 4.0 - 10.5 K/uL   RBC 3.82 (L) 3.87 - 5.11 MIL/uL  Hemoglobin 11.8 (L) 12.0 - 15.0 g/dL   HCT 33.4 (L) 36.0 - 46.0 %   MCV 87.4 78.0 - 100.0 fL   MCH 30.9 26.0 - 34.0 pg   MCHC 35.3 30.0 - 36.0 g/dL   RDW 12.6 11.5 - 15.5 %   Platelets 239 150 - 400 K/uL   Neutrophils Relative % 48 %   Neutro Abs 4.0 1.7 - 7.7 K/uL   Lymphocytes Relative 47 %   Lymphs Abs 4.0 0.7 - 4.0 K/uL   Monocytes Relative 4 %   Monocytes Absolute 0.4 0.1 - 1.0 K/uL   Eosinophils Relative 1 %   Eosinophils Absolute 0.1 0.0 - 0.7 K/uL   Basophils Relative 0 %   Basophils Absolute 0.0 0.0 - 0.1 K/uL  Wet prep, genital     Status: Abnormal   Collection Time: 03/08/16 11:30 PM  Result Value Ref Range   Yeast Wet Prep HPF POC NONE SEEN NONE SEEN   Trich, Wet Prep  NONE SEEN NONE SEEN   Clue Cells Wet Prep HPF POC PRESENT (A) NONE SEEN   WBC, Wet Prep HPF POC MODERATE (A) NONE SEEN   Sperm NONE SEEN    US Ob Comp Less 14 Wks  Result Date: 03/09/2016 CLINICAL DATA:  Vaginal bleeding before [redacted] weeks gestation O20.9 (ICD-10-CM) EXAM: OBSTETRIC <14 WK [place] AND TRANSVAGINAL OB US TECHNIQUE: Both transabdominal and transvaginal ultrasound examinations were performed for complete evaluation of the gestation as well as the maternal uterus, adnexal regions, and pelvic cul-de-sac. Transvaginal technique was performed to assess early pregnancy. COMPARISON:  None. FINDINGS: Intrauterine gestational sac: Irregular appearing gestational sac is noted. Yolk sac:  Yes Embryo:  Yes Cardiac Activity: Yes Heart Rate: 123  bpm CRL:  6 mm  mm   6 w   3 d                  [place] EDC: 10/27/2016 Subchorionic hemorrhage:  Moderate sized subchronic hemorrhage Maternal uterus/adnexae: Normal ovaries and adnexa. No uterine masses. No pelvic free fluid. IMPRESSION: 1. Single live intrauterine pregnancy with a measured gestational age of [redacted] weeks 3 days. 2. Somewhat irregular gestational sac with a moderate subchronic hemorrhage. 3. No other abnormalities. Electronically Signed   By: Lajean Manes M.D.   On: 03/09/2016 00:02  US Ob Transvaginal  Result Date: 03/09/2016 CLINICAL DATA:  Vaginal bleeding before [redacted] weeks gestation O20.9 (ICD-10-CM) EXAM: OBSTETRIC <14 WK [place] AND TRANSVAGINAL OB US TECHNIQUE: Both transabdominal and transvaginal ultrasound examinations were performed for complete evaluation of the gestation as well as the maternal uterus, adnexal regions, and pelvic cul-de-sac. Transvaginal technique was performed to assess early pregnancy. COMPARISON:  None. FINDINGS: Intrauterine gestational sac: Irregular appearing gestational sac is noted. Yolk sac:  Yes Embryo:  Yes Cardiac Activity: Yes Heart Rate: 123  bpm CRL:  6 mm  mm   6 w   3 d                  [place] EDC: 10/27/2016 Subchorionic  hemorrhage:  Moderate sized subchronic hemorrhage Maternal uterus/adnexae: Normal ovaries and adnexa. No uterine masses. No pelvic free fluid. IMPRESSION: 1. Single live intrauterine pregnancy with a measured gestational age of [redacted] weeks 3 days. 2. Somewhat irregular gestational sac with a moderate subchronic hemorrhage. 3. No other abnormalities. Electronically Signed   By: Lajean Manes M.D.   On: 03/09/2016 00:02   MAU Course  Procedures None  MDM +UPT from earlier this week in  ED UA, wet prep, GC/chlamydia, CBC, quant hCG, HIV, RPR and [place] today to rule out ectopic pregnancy  Assessment and Plan  A: SIUP at [redacted]w[redacted]d Subchorionic hemorrhage Bacterial vaginosis  P: Discharge home Rx for Flagyl given to patient  First trimester precautions discussed Patient advised to follow-up with OB provider of choice to start prenatal care Pregnancy confirmation letter given with list of area OB providers Patient may return to MAU as needed or if her condition were to change or worsen   Luvenia Redden, PA-C  03/09/2016, 12:14 AM

## 2016-03-09 DIAGNOSIS — Z3A01 Less than 8 weeks gestation of pregnancy: Secondary | ICD-10-CM

## 2016-03-09 DIAGNOSIS — O468X1 Other antepartum hemorrhage, first trimester: Secondary | ICD-10-CM

## 2016-03-09 LAB — RPR: RPR Ser Ql: NONREACTIVE

## 2016-03-09 LAB — HIV ANTIBODY (ROUTINE TESTING W REFLEX): HIV Screen 4th Generation wRfx: NONREACTIVE

## 2016-03-09 LAB — HCG, QUANTITATIVE, PREGNANCY: HCG, BETA CHAIN, QUANT, S: 98147 m[IU]/mL — AB (ref <5)

## 2016-03-09 MED ORDER — METRONIDAZOLE 500 MG PO TABS: 500.0000 mg | ORAL_TABLET | Freq: Two times a day (BID) | ORAL | 0 refills | Status: DC

## 2016-03-09 NOTE — Discharge Instructions (Signed)
Vaginal Bleeding During Pregnancy, First Trimester A small amount of bleeding (spotting) from the vagina is common in early pregnancy. Sometimes the bleeding is normal and is not a problem, and sometimes it is a sign of something serious. Be sure to tell your doctor about any bleeding from your vagina right away. HOME CARE  Watch your condition for any changes.  Follow your doctor's instructions about how active you can be.  If you are on bed rest:  You may need to stay in bed and only get up to use the bathroom.  You may be allowed to do some activities.  If you need help, make plans for someone to help you.  Write down:  The number of pads you use each day.  How often you change pads.  How soaked (saturated) your pads are.  Do not use tampons.  Do not douche.  Do not have sex or orgasms until your doctor says it is okay.  If you pass any tissue from your vagina, save the tissue so you can show it to your doctor.  Only take medicines as told by your doctor.  Do not take aspirin because it can make you bleed.  Keep all follow-up visits as told by your doctor. GET HELP IF:   You bleed from your vagina.  You have cramps.  You have labor pains.  You have a fever that does not go away after you take medicine. GET HELP RIGHT AWAY IF:   You have very bad cramps in your back or belly (abdomen).  You pass large clots or tissue from your vagina.  You bleed more.  You feel light-headed or weak.  You pass out (faint).  You have chills.  You are leaking fluid or have a gush of fluid from your vagina.  You pass out while pooping (having a bowel movement). MAKE SURE YOU:  Understand these instructions.  Will watch your condition.  Will get help right away if you are not doing well or get worse.   This information is not intended to replace advice given to you by your health care provider. Make sure you discuss any questions you have with your health care  provider.   Document Released: 12/13/2013 Document Reviewed: 12/13/2013 Elsevier Interactive Patient Education 2016 [place].  Pelvic Rest Pelvic rest is sometimes recommended for women when:   The placenta is partially or completely covering the opening of the cervix (placenta previa).  There is bleeding between the uterine wall and the amniotic sac in the first trimester (subchorionic hemorrhage).  The cervix begins to open without labor starting (incompetent cervix, cervical insufficiency).  The labor is too early (preterm labor). HOME CARE INSTRUCTIONS  Do not have sexual intercourse, stimulation, or an orgasm.  Do not use tampons, douche, or put anything in the vagina.  Do not lift anything over 10 pounds (4.5 kg).  Avoid strenuous activity or straining your pelvic muscles. SEEK MEDICAL CARE IF:  You have any vaginal bleeding during pregnancy. Treat this as a potential emergency.  You have cramping pain felt low in the stomach (stronger than menstrual cramps).  You notice vaginal discharge (watery, mucus, or bloody).  You have a low, dull backache.  There are regular contractions or uterine tightening. SEEK IMMEDIATE MEDICAL CARE IF: You have vaginal bleeding and have placenta previa.    This information is not intended to replace advice given to you by your health care provider. Make sure you discuss any questions you have with  your health care provider.   Document Released: 11/23/2010 Document Revised: 10/21/2011 Document Reviewed: 01/30/2015 Elsevier Interactive Patient Education 2016 [place] Hematoma A subchorionic hematoma is a gathering of blood between the outer wall of the placenta and the inner wall of the womb (uterus). The placenta is the organ that connects the fetus to the wall of the uterus. The placenta performs the feeding, breathing (oxygen to the fetus), and waste removal (excretory work) of the fetus.  Subchorionic  hematoma is the most common abnormality found on a result from ultrasonography done during the first trimester or early second trimester of pregnancy. If there has been little or no vaginal bleeding, early small hematomas usually shrink on their own and do not affect your baby or pregnancy. The blood is gradually absorbed over 1-2 weeks. When bleeding starts later in pregnancy or the hematoma is larger or occurs in an older pregnant woman, the outcome may not be as good. Larger hematomas may get bigger, which increases the chances for miscarriage. Subchorionic hematoma also increases the risk of premature detachment of the placenta from the uterus, preterm (premature) labor, and stillbirth. HOME CARE INSTRUCTIONS  Stay on bed rest if your health care provider recommends this. Although bed rest will not prevent more bleeding or prevent a miscarriage, your health care provider may recommend bed rest until you are advised otherwise.  Avoid heavy lifting (more than 10 lb [4.5 kg]), exercise, sexual intercourse, or douching as directed by your health care provider.  Keep track of the number of pads you use each day and how soaked (saturated) they are. Write down this information.  Do not use tampons.  Keep all follow-up appointments as directed by your health care provider. Your health care provider may ask you to have follow-up blood tests or ultrasound tests or both. SEEK IMMEDIATE MEDICAL CARE IF:  You have severe cramps in your stomach, back, abdomen, or pelvis.  You have a fever.  You pass large clots or tissue. Save any tissue for your health care provider to look at.  Your bleeding increases or you become lightheaded, feel weak, or have fainting episodes.   This information is not intended to replace advice given to you by your health care provider. Make sure you discuss any questions you have with your health care provider.   Document Released: 11/13/2006 Document Revised: 08/19/2014  Document Reviewed: 02/25/2013 Elsevier Interactive Patient Education [employer].

## 2016-03-11 LAB — GC/CHLAMYDIA PROBE AMP (~~LOC~~) NOT AT ARMC
Chlamydia: NEGATIVE
Neisseria Gonorrhea: NEGATIVE

## 2016-03-29 ENCOUNTER — Encounter: Payer: Self-pay | Admitting: *Deleted

## 2016-03-29 DIAGNOSIS — Z3481 Encounter for supervision of other normal pregnancy, first trimester: Secondary | ICD-10-CM

## 2016-03-29 DIAGNOSIS — Z3490 Encounter for supervision of normal pregnancy, unspecified, unspecified trimester: Secondary | ICD-10-CM | POA: Insufficient documentation

## 2016-04-08 ENCOUNTER — Encounter: Payer: Self-pay | Admitting: Obstetrics & Gynecology

## 2016-04-08 ENCOUNTER — Ambulatory Visit (INDEPENDENT_AMBULATORY_CARE_PROVIDER_SITE_OTHER): Payer: Medicaid Other | Admitting: Obstetrics & Gynecology

## 2016-04-08 VITALS — BP 117/78 | HR 89 | Temp 98.1°F | Wt 145.7 lb

## 2016-04-08 DIAGNOSIS — Z3491 Encounter for supervision of normal pregnancy, unspecified, first trimester: Secondary | ICD-10-CM

## 2016-04-08 DIAGNOSIS — Z1389 Encounter for screening for other disorder: Secondary | ICD-10-CM

## 2016-04-08 DIAGNOSIS — Z331 Pregnant state, incidental: Secondary | ICD-10-CM

## 2016-04-08 DIAGNOSIS — Z3481 Encounter for supervision of other normal pregnancy, first trimester: Secondary | ICD-10-CM

## 2016-04-08 LAB — POCT URINALYSIS DIPSTICK
BILIRUBIN UA: NEGATIVE
Blood, UA: NEGATIVE
Glucose, UA: NEGATIVE
KETONES UA: NEGATIVE
Nitrite, UA: NEGATIVE
SPEC GRAV UA: 1.015
Urobilinogen, UA: 0.2
pH, UA: 7

## 2016-04-08 NOTE — Progress Notes (Signed)
Pt c/o lower abdominal cramping-intermittent.

## 2016-04-08 NOTE — Progress Notes (Signed)
  Subjective:    Angela Cortez is a G2P1001 [redacted]w[redacted]d being seen today for her first obstetrical visit.  Her obstetrical history is significant for subchorionic hemorrhage in first trimester. Patient does intend to breast feed. Pregnancy history fully reviewed.  Patient reports no complaints.  Vitals:   04/08/16 1000  BP: 117/78  Pulse: 89  Temp: 98.1 F (36.7 C)  Weight: 145 lb 11.2 oz (66.1 kg)    HISTORY: OB History  Gravida Para Term Preterm AB Living  2 1 1     1   SAB TAB Ectopic Multiple Live Births               # Outcome Date GA Lbr Len/2nd Weight Sex Delivery Anes PTL Lv  2 Current           1 Term 05/17/12 [redacted]w[redacted]d / 00:16 7 lb 13.9 oz (3.57 kg) M Vag-Spont EPI       Past Medical History:  Diagnosis Date  . No pertinent past medical history    Past Surgical History:  Procedure Laterality Date  . ACROMIO-CLAVICULAR JOINT REPAIR    . CLAVICLE SURGERY     Family History  Problem Relation Age of Onset  . Diabetes Father   . Hypertension Father   . Cancer Paternal Uncle   . Cancer Paternal Grandmother      Exam    Uterus:     Pelvic Exam:    Perineum: No Hemorrhoids   Vulva: normal   Vagina:  normal mucosa   pH:     Cervix: no lesions   Adnexa: normal adnexa   Bony Pelvis: average  System: Breast:  normal appearance, no masses or tenderness   Skin: normal coloration and turgor, no rashes    Neurologic: oriented, normal mood   Extremities: normal strength, tone, and muscle mass   HEENT neck supple with midline trachea and thyroid without masses   Mouth/Teeth dental hygiene good   Neck supple   Cardiovascular: regular rate and rhythm, no murmurs or gallops   Respiratory:  appears well, vitals normal, no respiratory distress, acyanotic, normal RR, neck free of mass or lymphadenopathy, chest clear, no wheezing, crepitations, rhonchi, normal symmetric air entry   Abdomen: soft, non-tender; bowel sounds normal; no masses,  no organomegaly   Urinary:  urethral meatus normal      Assessment:    Pregnancy: G2P1001 Patient Active Problem List   Diagnosis Date Noted  . Supervision of normal pregnancy, antepartum 03/29/2016  . Normal delivery 05/17/2012        Plan:     Initial labs drawn. Prenatal vitamins. Problem list reviewed and updated. Genetic Screening discussed First Screen: ordered.  Ultrasound discussed; fetal survey: 18+ weeks.  Follow up in 4 weeks. 50% of 30 min visit spent on counseling and coordination of care.     ARNOLD,JAMES 04/08/2016

## 2016-04-10 LAB — GC/CHLAMYDIA PROBE AMP
Chlamydia trachomatis, NAA: NEGATIVE
NEISSERIA GONORRHOEAE BY PCR: NEGATIVE

## 2016-04-10 LAB — URINE CULTURE, OB REFLEX

## 2016-04-10 LAB — CULTURE, OB URINE

## 2016-04-17 LAB — PRENATAL PROFILE I(LABCORP)
ANTIBODY SCREEN: NEGATIVE
Basophils Absolute: 0 10*3/uL (ref 0.0–0.2)
Basos: 0 %
EOS (ABSOLUTE): 0.1 10*3/uL (ref 0.0–0.4)
EOS: 1 %
HEP B S AG: NEGATIVE
Hematocrit: 35.7 % (ref 34.0–46.6)
Hemoglobin: 11.7 g/dL (ref 11.1–15.9)
IMMATURE GRANS (ABS): 0 10*3/uL (ref 0.0–0.1)
IMMATURE GRANULOCYTES: 0 %
LYMPHS ABS: 2.3 10*3/uL (ref 0.7–3.1)
LYMPHS: 34 %
MCH: 30.3 pg (ref 26.6–33.0)
MCHC: 32.8 g/dL (ref 31.5–35.7)
MCV: 93 fL (ref 79–97)
Monocytes Absolute: 0.5 10*3/uL (ref 0.1–0.9)
Monocytes: 7 %
NEUTROS PCT: 58 %
Neutrophils Absolute: 4 10*3/uL (ref 1.4–7.0)
Platelets: 308 10*3/uL (ref 150–379)
RBC: 3.86 x10E6/uL (ref 3.77–5.28)
RDW: 13.3 % (ref 12.3–15.4)
RH TYPE: POSITIVE
RPR Ser Ql: NONREACTIVE
RUBELLA: 2.49 {index} (ref >0.99)
WBC: 6.9 10*3/uL (ref 3.4–10.8)

## 2016-04-17 LAB — HIV ANTIBODY (ROUTINE TESTING W REFLEX): HIV Screen 4th Generation wRfx: NONREACTIVE

## 2016-04-17 LAB — VITAMIN D 25 HYDROXY (VIT D DEFICIENCY, FRACTURES): Vit D, 25-Hydroxy: 16.1 ng/mL — ABNORMAL LOW (ref 30.0–100.0)

## 2016-04-17 LAB — TOXASSURE SELECT 13 (MW), URINE: PDF: 0

## 2016-04-17 LAB — VARICELLA ZOSTER ANTIBODY, IGG: Varicella zoster IgG: 226 index (ref >165)

## 2016-04-18 ENCOUNTER — Ambulatory Visit (HOSPITAL_COMMUNITY): Admission: RE | Admit: 2016-04-18 | Payer: Medicaid Other | Source: Ambulatory Visit

## 2016-04-18 ENCOUNTER — Encounter (HOSPITAL_COMMUNITY): Payer: Self-pay

## 2016-04-18 ENCOUNTER — Other Ambulatory Visit (HOSPITAL_COMMUNITY): Payer: Medicaid Other

## 2016-04-24 ENCOUNTER — Ambulatory Visit (HOSPITAL_COMMUNITY)
Admission: RE | Admit: 2016-04-24 | Discharge: 2016-04-24 | Disposition: A | Discharge status: Home / Self Care | Payer: Medicaid Other | Source: Ambulatory Visit | Attending: Obstetrics & Gynecology | Admitting: Obstetrics & Gynecology

## 2016-04-24 ENCOUNTER — Encounter (HOSPITAL_COMMUNITY): Payer: Self-pay

## 2016-04-24 ENCOUNTER — Other Ambulatory Visit: Payer: Self-pay | Admitting: Obstetrics & Gynecology

## 2016-04-24 DIAGNOSIS — Z369 Encounter for antenatal screening, unspecified: Secondary | ICD-10-CM

## 2016-04-24 DIAGNOSIS — Z36 Encounter for antenatal screening of mother: Secondary | ICD-10-CM | POA: Diagnosis not present

## 2016-04-24 DIAGNOSIS — Z3A13 13 weeks gestation of pregnancy: Secondary | ICD-10-CM

## 2016-04-24 DIAGNOSIS — Z3481 Encounter for supervision of other normal pregnancy, first trimester: Secondary | ICD-10-CM

## 2016-04-29 ENCOUNTER — Other Ambulatory Visit (HOSPITAL_COMMUNITY): Payer: Self-pay

## 2016-04-29 ENCOUNTER — Encounter: Payer: Self-pay | Admitting: *Deleted

## 2016-05-07 ENCOUNTER — Encounter: Payer: Self-pay | Admitting: Obstetrics

## 2016-05-07 ENCOUNTER — Ambulatory Visit (INDEPENDENT_AMBULATORY_CARE_PROVIDER_SITE_OTHER): Payer: Medicaid Other | Admitting: Obstetrics

## 2016-05-07 VITALS — BP 95/66 | HR 92 | Temp 98.2°F | Wt 149.0 lb

## 2016-05-07 DIAGNOSIS — Z331 Pregnant state, incidental: Secondary | ICD-10-CM

## 2016-05-07 DIAGNOSIS — Z3492 Encounter for supervision of normal pregnancy, unspecified, second trimester: Secondary | ICD-10-CM

## 2016-05-07 DIAGNOSIS — O09892 Supervision of other high risk pregnancies, second trimester: Secondary | ICD-10-CM | POA: Diagnosis not present

## 2016-05-07 DIAGNOSIS — Z1389 Encounter for screening for other disorder: Secondary | ICD-10-CM

## 2016-05-07 LAB — POCT URINALYSIS DIPSTICK
Bilirubin, UA: NEGATIVE
Blood, UA: NEGATIVE
Glucose, UA: NEGATIVE
Ketones, UA: NEGATIVE
NITRITE UA: NEGATIVE
PH UA: 5
SPEC GRAV UA: 1.025
UROBILINOGEN UA: NEGATIVE

## 2016-05-07 NOTE — Progress Notes (Signed)
Patient states that she is overall feeling well.

## 2016-05-07 NOTE — Progress Notes (Signed)
  Subjective:    Angela Cortez is a 25 y.o. female being seen today for her obstetrical visit. She is at [redacted]w[redacted]d gestation. Patient reports: nausea and vomiting.  Problem List Items Addressed This Visit    None    Visit Diagnoses    Prenatal care, second trimester    -  Primary   Relevant Orders   [place] MFM OB COMP + 14 WK   POCT Urinalysis Dipstick (Completed)     Patient Active Problem List   Diagnosis Date Noted  . Supervision of normal pregnancy, antepartum 03/29/2016  . Normal delivery 05/17/2012    Objective:     BP 95/66   Pulse 92   Temp 98.2 F (36.8 C)   Wt 149 lb (67.6 kg)   LMP 01/22/2016 (Exact Date)   BMI 25.58 kg/m  Uterine Size: Below umbilicus     Assessment:    Pregnancy @ [redacted]w[redacted]d  weeks Doing well    Plan:    Problem list reviewed and updated. Labs reviewed.  Follow up in 4 weeks. FIRST/CF mutation testing/NIPT/QUAD SCREEN/fragile X/Ashkenazi Jewish population testing/Spinal muscular atrophy discussed: requested. Role of ultrasound in pregnancy discussed; fetal survey: requested. Amniocentesis discussed: not indicated. 50% of 15 minute visit spent on counseling and coordination of care.  Patient ID: Angela Cortez, female   DOB: 12/01/1990, 25 y.o.   MRN: [id number]

## 2016-06-03 ENCOUNTER — Ambulatory Visit (HOSPITAL_COMMUNITY)
Admission: RE | Admit: 2016-06-03 | Discharge: 2016-06-03 | Disposition: A | Discharge status: Home / Self Care | Payer: Medicaid Other | Source: Ambulatory Visit | Attending: Obstetrics | Admitting: Obstetrics

## 2016-06-03 DIAGNOSIS — Z363 Encounter for antenatal screening for malformations: Secondary | ICD-10-CM | POA: Diagnosis present

## 2016-06-03 DIAGNOSIS — Z3492 Encounter for supervision of normal pregnancy, unspecified, second trimester: Secondary | ICD-10-CM

## 2016-06-03 DIAGNOSIS — Z3A19 19 weeks gestation of pregnancy: Secondary | ICD-10-CM | POA: Diagnosis not present

## 2016-06-05 ENCOUNTER — Encounter: Payer: Self-pay | Admitting: Certified Nurse Midwife

## 2016-06-13 ENCOUNTER — Encounter (HOSPITAL_COMMUNITY): Payer: Self-pay | Admitting: *Deleted

## 2016-06-13 ENCOUNTER — Inpatient Hospital Stay (HOSPITAL_COMMUNITY): Payer: Medicaid Other

## 2016-06-13 ENCOUNTER — Inpatient Hospital Stay (HOSPITAL_COMMUNITY)
Admission: AD | Admit: 2016-06-13 | Discharge: 2016-06-13 | Disposition: A | Discharge status: Home / Self Care | Payer: Medicaid Other | Source: Ambulatory Visit | Attending: Obstetrics & Gynecology | Admitting: Obstetrics & Gynecology

## 2016-06-13 DIAGNOSIS — Z87891 Personal history of nicotine dependence: Secondary | ICD-10-CM | POA: Insufficient documentation

## 2016-06-13 DIAGNOSIS — R109 Unspecified abdominal pain: Secondary | ICD-10-CM | POA: Insufficient documentation

## 2016-06-13 DIAGNOSIS — O26892 Other specified pregnancy related conditions, second trimester: Secondary | ICD-10-CM | POA: Insufficient documentation

## 2016-06-13 DIAGNOSIS — K439 Ventral hernia without obstruction or gangrene: Secondary | ICD-10-CM | POA: Insufficient documentation

## 2016-06-13 DIAGNOSIS — Z3A20 20 weeks gestation of pregnancy: Secondary | ICD-10-CM | POA: Diagnosis not present

## 2016-06-13 DIAGNOSIS — R1033 Periumbilical pain: Secondary | ICD-10-CM

## 2016-06-13 DIAGNOSIS — Z8249 Family history of ischemic heart disease and other diseases of the circulatory system: Secondary | ICD-10-CM | POA: Insufficient documentation

## 2016-06-13 DIAGNOSIS — O99612 Diseases of the digestive system complicating pregnancy, second trimester: Secondary | ICD-10-CM | POA: Insufficient documentation

## 2016-06-13 DIAGNOSIS — Z809 Family history of malignant neoplasm, unspecified: Secondary | ICD-10-CM | POA: Insufficient documentation

## 2016-06-13 DIAGNOSIS — Z833 Family history of diabetes mellitus: Secondary | ICD-10-CM | POA: Diagnosis not present

## 2016-06-13 DIAGNOSIS — O9989 Other specified diseases and conditions complicating pregnancy, childbirth and the puerperium: Secondary | ICD-10-CM | POA: Diagnosis not present

## 2016-06-13 DIAGNOSIS — Z3490 Encounter for supervision of normal pregnancy, unspecified, unspecified trimester: Secondary | ICD-10-CM

## 2016-06-13 LAB — URINALYSIS, ROUTINE W REFLEX MICROSCOPIC
Bilirubin Urine: NEGATIVE
GLUCOSE, UA: NEGATIVE mg/dL
HGB URINE DIPSTICK: NEGATIVE
Ketones, ur: NEGATIVE mg/dL
Leukocytes, UA: NEGATIVE
Nitrite: NEGATIVE
Protein, ur: NEGATIVE mg/dL
SPECIFIC GRAVITY, URINE: 1.02 (ref 1.005–1.030)
pH: 6 (ref 5.0–8.0)

## 2016-06-13 LAB — RAPID URINE DRUG SCREEN, HOSP PERFORMED
AMPHETAMINES: NOT DETECTED
Barbiturates: NOT DETECTED
Benzodiazepines: NOT DETECTED
Cocaine: NOT DETECTED
Opiates: NOT DETECTED
TETRAHYDROCANNABINOL: POSITIVE — AB

## 2016-06-13 MED ORDER — FUTURO ABDOMINAL SUPPORT MISC: 1.0000 | Freq: Every day | 0 refills | Status: DC

## 2016-06-13 MED ORDER — OXYCODONE-ACETAMINOPHEN 5-325 MG PO TABS
1.0000 | ORAL_TABLET | Freq: Once | ORAL | Status: AC
  Administered 2016-06-13: 1 via ORAL
  Filled 2016-06-13: qty 1

## 2016-06-13 MED ORDER — HYDROMORPHONE HCL 1 MG/ML IJ SOLN: 1.0000 mg | Freq: Once | INTRAMUSCULAR | Status: DC

## 2016-06-13 NOTE — Discharge Instructions (Signed)
Hernia  A hernia happens when an organ or tissue inside your body pushes out through a weak spot in the belly (abdomen).  HOME CARE  · Avoid stretching or overusing (straining) the muscles near the hernia.  · Do not lift anything heavier than 10 lb (4.5 kg).  · Use the muscles in your leg when you lift something up. Do not use the muscles in your back.  · When you cough, try to cough gently.  · Eat a diet that has a lot of fiber. Eat lots of fruits and vegetables.  · Drink enough fluids to keep your pee (urine) clear or pale yellow. Try to drink 6-8 glasses of water a day.  · Take medicines to make your poop soft (stool softeners) as told by your doctor.  · Lose weight, if you are overweight.  · Do not use any tobacco products, including cigarettes, chewing tobacco, or electronic cigarettes. If you need help quitting, ask your doctor.  · Keep all follow-up visits as told by your doctor. This is important.  GET HELP IF:  · The skin by the hernia gets puffy (swollen) or red.  · The hernia is painful.  GET HELP RIGHT AWAY IF:  · You have a fever.  · You have belly pain that is getting worse.  · You feel sick to your stomach (nauseous) or you throw up (vomit).  · You cannot push the hernia back in place by gently pressing on it while you are lying down.  · The hernia:    Changes in shape or size.    Is stuck outside your belly.    Changes color.    Feels hard or tender.     This information is not intended to replace advice given to you by your health care provider. Make sure you discuss any questions you have with your health care provider.     Document Released: 01/16/2010 Document Revised: 08/19/2014 Document Reviewed: 06/08/2014  Elsevier Interactive Patient Education ©2016 Elsevier Inc.

## 2016-06-13 NOTE — MAU Note (Addendum)
Pt C/O severe pain around her umbilicus that started 30 minutes.  Denies bleeding or LOF.  Pt is crying, very tearful.  Is unable to get urine specimen @ this time.

## 2016-06-13 NOTE — MAU Provider Note (Signed)
MAU HISTORY AND PHYSICAL  Chief Complaint:  Abdominal Pain   Angela Cortez is a 25 y.o.  G2P1001  at [redacted]w[redacted]d presenting for Abdominal Pain . Patient states she has been having  none contractions, none vaginal bleeding, intact membranes, with active fetal movement.    Reports sudden onset of severe abdominal pain at her belly button that began 1 hour ago while she was at work. Came off break and started feeling the pain. It is sharp, 10/10, does not radiate, has been constant, not worsening or getting better. Nothing improves the pain. Touching her stomach makes it worse. She denies fevers, chills, dysuria, nausea, vomiting. She has never had this before.  Past Medical History:  Diagnosis Date  . No pertinent past medical history     Past Surgical History:  Procedure Laterality Date  . ACROMIO-CLAVICULAR JOINT REPAIR    . CLAVICLE SURGERY      Family History  Problem Relation Age of Onset  . Diabetes Father   . Hypertension Father   . Cancer Paternal Uncle   . Cancer Paternal Grandmother     Social History  Substance Use Topics  . Smoking status: Former Smoker    Packs/day: 0.25    Types: Cigarettes    Quit date: 02/04/2016  . Smokeless tobacco: Never Used  . Alcohol use No     Comment: socially previous to pregnancy    No Known Allergies  Prescriptions Prior to Admission  Medication Sig Dispense Refill Last Dose  . Prenatal MV & Min w/FA-DHA (PRENATAL ADULT GUMMY/DHA/FA PO) Take 2 each by mouth daily.    Past Month at Unknown time    Review of Systems - Negative except for what is mentioned in HPI.  Physical Exam  Blood pressure 133/82, pulse (!) 134, temperature 98.4 F (36.9 C), temperature source Oral, resp. rate 22, last menstrual period 01/22/2016, unknown if currently breastfeeding. GENERAL: Well-developed, well-nourished female in no acute distress.  LUNGS: No respiratory distress HEART: Regular rate ABDOMEN: gravid abdomen, protrusion superior to  umbilicus that is tender to palpation, not reducible   EXTREMITIES: Nontender, no edema, 2+ distal pulses. FHT:  144 by doppler Contractions: none   Labs: Results for orders placed or performed during the hospital encounter of 06/13/16 (from the past 24 hour(s))  Urinalysis, Routine w reflex microscopic (not at [facility])   Collection Time: 06/13/16  5:17 PM  Result Value Ref Range   Color, Urine YELLOW YELLOW   APPearance HAZY (A) CLEAR   Specific Gravity, Urine 1.020 1.005 - 1.030   pH 6.0 5.0 - 8.0   Glucose, UA NEGATIVE NEGATIVE mg/dL   Hgb urine dipstick NEGATIVE NEGATIVE   Bilirubin Urine NEGATIVE NEGATIVE   Ketones, ur NEGATIVE NEGATIVE mg/dL   Protein, ur NEGATIVE NEGATIVE mg/dL   Nitrite NEGATIVE NEGATIVE   Leukocytes, UA NEGATIVE NEGATIVE  Urine rapid drug screen (hosp performed)   Collection Time: 06/13/16  5:17 PM  Result Value Ref Range   Opiates NONE DETECTED NONE DETECTED   Cocaine NONE DETECTED NONE DETECTED   Benzodiazepines NONE DETECTED NONE DETECTED   Amphetamines NONE DETECTED NONE DETECTED   Tetrahydrocannabinol POSITIVE (A) NONE DETECTED   Barbiturates NONE DETECTED NONE DETECTED    Imaging Studies:  US Abdomen Limited  Result Date: 06/13/2016 CLINICAL DATA:  Acute onset of severe abdominal pain superior to the umbilicus. Patient is [redacted] weeks pregnant. Initial encounter. EXAM: LIMITED ABDOMINAL ULTRASOUND COMPARISON:  Pelvic ultrasound performed 04/24/2016 FINDINGS: There is a focal well-circumscribed 1.6  x 1.5 x 0.6 cm mass extending through the fascia superior to the umbilicus, with a neck measuring 3 mm. Its echogenicity is compatible with focal fat, and this likely reflects a small focal anterior abdominal wall hernia, containing only fat. No significant abnormal associated blood flow is seen. No suspicious masses are identified. IMPRESSION: Small focal anterior abdominal wall hernia superior to the umbilicus, containing only fat, measuring 1.6 x 1.5 x 0.6 cm,  with a relatively narrow neck. Electronically Signed   By: Garald Balding M.D.   On: 06/13/2016 18:45   [place] Mfm Ob Comp + 14 Wk  Result Date: 06/03/2016 OBSTETRICAL ULTRASOUND: This exam was performed within a [facility]. The OB US report was generated in the AS system, and faxed to the ordering physician.  This report is available in the [facility]. See the [facility] Obstetric US report via the Image Link.   Assessment: Angela Cortez is  25 y.o. G2P1001 at [redacted]w[redacted]d presents with Abdominal Pain . MDM UA Abdominal US  Plan:  Ventral hernia-  Tylenol for pain control Return precautions given Patient can follow up outpatient with surgery for repair after delivery Discharge home  Steve Rattler, DO PGY-1 11/2/20176:50 PM   OB [place]  I have seen and examined this patient and agree with above documentation in the resident's note.   [name], MD 7:45 PM

## 2016-06-20 ENCOUNTER — Encounter: Payer: Medicaid Other | Admitting: Obstetrics & Gynecology

## 2016-07-09 ENCOUNTER — Encounter: Payer: Medicaid Other | Admitting: Obstetrics & Gynecology

## 2016-08-06 ENCOUNTER — Ambulatory Visit (INDEPENDENT_AMBULATORY_CARE_PROVIDER_SITE_OTHER): Payer: Medicaid Other | Admitting: Certified Nurse Midwife

## 2016-08-06 VITALS — BP 98/67 | HR 90 | Wt 172.0 lb

## 2016-08-06 DIAGNOSIS — O0933 Supervision of pregnancy with insufficient antenatal care, third trimester: Secondary | ICD-10-CM

## 2016-08-06 DIAGNOSIS — Z3480 Encounter for supervision of other normal pregnancy, unspecified trimester: Secondary | ICD-10-CM

## 2016-08-06 DIAGNOSIS — Z3483 Encounter for supervision of other normal pregnancy, third trimester: Secondary | ICD-10-CM

## 2016-08-06 DIAGNOSIS — O0932 Supervision of pregnancy with insufficient antenatal care, second trimester: Secondary | ICD-10-CM | POA: Insufficient documentation

## 2016-08-06 NOTE — Patient Instructions (Addendum)
Third Trimester of Pregnancy The third trimester is from week 29 through week 40 (months 7 through 9). The third trimester is a time when the unborn baby (fetus) is growing rapidly. At the end of the ninth month, the fetus is about 20 inches in length and weighs 6-10 pounds. Body changes during your third trimester Your body goes through many changes during pregnancy. The changes vary from woman to woman. During the third trimester:  Your weight will continue to increase. You can expect to gain 25-35 pounds (11-16 kg) by the end of the pregnancy.  You may begin to get stretch marks on your hips, abdomen, and breasts.  You may urinate more often because the fetus is moving lower into your pelvis and pressing on your bladder.  You may develop or continue to have heartburn. This is caused by increased hormones that slow down muscles in the digestive tract.  You may develop or continue to have constipation because increased hormones slow digestion and cause the muscles that push waste through your intestines to relax.  You may develop hemorrhoids. These are swollen veins (varicose veins) in the rectum that can itch or be painful.  You may develop swollen, bulging veins (varicose veins) in your legs.  You may have increased body aches in the pelvis, back, or thighs. This is due to weight gain and increased hormones that are relaxing your joints.  You may have changes in your hair. These can include thickening of your hair, rapid growth, and changes in texture. Some women also have hair loss during or after pregnancy, or hair that feels dry or thin. Your hair will most likely return to normal after your baby is born.  Your breasts will continue to grow and they will continue to become tender. A yellow fluid (colostrum) may leak from your breasts. This is the first milk you are producing for your baby.  Your belly button may stick out.  You may notice more swelling in your hands, face, or  ankles.  You may have increased tingling or numbness in your hands, arms, and legs. The skin on your belly may also feel numb.  You may feel short of breath because of your expanding uterus.  You may have more problems sleeping. This can be caused by the size of your belly, increased need to urinate, and an increase in your body's metabolism.  You may notice the fetus "dropping," or moving lower in your abdomen.  You may have increased vaginal discharge.  Your cervix becomes thin and soft (effaced) near your due date. What to expect at prenatal visits You will have prenatal exams every 2 weeks until week 36. Then you will have weekly prenatal exams. During a routine prenatal visit:  You will be weighed to make sure you and the fetus are growing normally.  Your blood pressure will be taken.  Your abdomen will be measured to track your baby's growth.  The fetal heartbeat will be listened to.  Any test results from the previous visit will be discussed.  You may have a cervical check near your due date to see if you have effaced. At around 36 weeks, your health care provider will check your cervix. At the same time, your health care provider will also perform a test on the secretions of the vaginal tissue. This test is to determine if a type of bacteria, Group B streptococcus, is present. Your health care provider will explain this further. Your health care provider may ask you:    What your birth plan is.  How you are feeling.  If you are feeling the baby move.  If you have had any abnormal symptoms, such as leaking fluid, bleeding, severe headaches, or abdominal cramping.  If you are using any tobacco products, including cigarettes, chewing tobacco, and electronic cigarettes.  If you have any questions. Other tests or screenings that may be performed during your third trimester include:  Blood tests that check for low iron levels (anemia).  Fetal testing to check the health,  activity level, and growth of the fetus. Testing is done if you have certain medical conditions or if there are problems during the pregnancy.  Nonstress test (NST). This test checks the health of your baby to make sure there are no signs of problems, such as the baby not getting enough oxygen. During this test, a belt is placed around your belly. The baby is made to move, and its heart rate is monitored during movement. What is false labor? False labor is a condition in which you feel small, irregular tightenings of the muscles in the womb (contractions) that eventually go away. These are called Braxton Hicks contractions. Contractions may last for hours, days, or even weeks before true labor sets in. If contractions come at regular intervals, become more frequent, increase in intensity, or become painful, you should see your health care provider. What are the signs of labor?  Abdominal cramps.  Regular contractions that start at 10 minutes apart and become stronger and more frequent with time.  Contractions that start on the top of the uterus and spread down to the lower abdomen and back.  Increased pelvic pressure and dull back pain.  A watery or bloody mucus discharge that comes from the vagina.  Leaking of amniotic fluid. This is also known as your "water breaking." It could be a slow trickle or a gush. Let your doctor know if it has a color or strange odor. If you have any of these signs, call your health care provider right away, even if it is before your due date. Follow these instructions at home: Eating and drinking  Continue to eat regular, healthy meals.  Do not eat:  Raw meat or meat spreads.  Unpasteurized milk or cheese.  Unpasteurized juice.  Store-made salad.  Refrigerated smoked seafood.  Hot dogs or deli meat, unless they are piping hot.  More than 6 ounces of albacore tuna a week.  Shark, swordfish, king mackerel, or tile fish.  Store-made salads.  Raw  sprouts, such as mung bean or alfalfa sprouts.  Take prenatal vitamins as told by your health care provider.  Take 1000 mg of calcium daily as told by your health care provider.  If you develop constipation:  Take over-the-counter or prescription medicines.  Drink enough fluid to keep your urine clear or pale yellow.  Eat foods that are high in fiber, such as fresh fruits and vegetables, whole grains, and beans.  Limit foods that are high in fat and processed sugars, such as fried and sweet foods. Activity  Exercise only as directed by your health care provider. Healthy pregnant women should aim for 2 hours and 30 minutes of moderate exercise per week. If you experience any pain or discomfort while exercising, stop.  Avoid heavy lifting.  Do not exercise in extreme heat or humidity, or at high altitudes.  Wear low-heel, comfortable shoes.  Practice good posture.  Do not travel far distances unless it is absolutely necessary and only with the approval   of your health care provider.  Wear your seat belt at all times while in a car, on a bus, or on a plane.  Take frequent breaks and rest with your legs elevated if you have leg cramps or low back pain.  Do not use hot tubs, steam rooms, or saunas.  You may continue to have sex unless your health care provider tells you otherwise. Lifestyle  Do not use any products that contain nicotine or tobacco, such as cigarettes and e-cigarettes. If you need help quitting, ask your health care provider.  Do not drink alcohol.  Do not use any medicinal herbs or unprescribed drugs. These chemicals affect the formation and growth of the baby.  If you develop varicose veins:  Wear support pantyhose or compression stockings as told by your healthcare provider.  Elevate your feet for 15 minutes, 3-4 times a day.  Wear a supportive maternity bra to help with breast tenderness. General instructions  Take over-the-counter and prescription  medicines only as told by your health care provider. There are medicines that are either safe or unsafe to take during pregnancy.  Take warm sitz baths to soothe any pain or discomfort caused by hemorrhoids. Use hemorrhoid cream or witch hazel if your health care provider approves.  Avoid cat litter boxes and soil used by cats. These carry germs that can cause birth defects in the baby. If you have a cat, ask someone to clean the litter box for you.  To prepare for the arrival of your baby:  Take prenatal classes to understand, practice, and ask questions about the labor and delivery.  Make a trial run to the hospital.  Visit the hospital and tour the maternity area.  Arrange for maternity or paternity leave through employers.  Arrange for family and friends to take care of pets while you are in the hospital.  Purchase a rear-facing car seat and make sure you know how to install it in your car.  Pack your hospital bag.  Prepare the baby's nursery. Make sure to remove all pillows and stuffed animals from the baby's crib to prevent suffocation.  Visit your dentist if you have not gone during your pregnancy. Use a soft toothbrush to brush your teeth and be gentle when you floss.  Keep all prenatal follow-up visits as told by your health care provider. This is important. Contact a health care provider if:  You are unsure if you are in labor or if your water has broken.  You become dizzy.  You have mild pelvic cramps, pelvic pressure, or nagging pain in your abdominal area.  You have lower back pain.  You have persistent nausea, vomiting, or diarrhea.  You have an unusual or bad smelling vaginal discharge.  You have pain when you urinate. Get help right away if:  You have a fever.  You are leaking fluid from your vagina.  You have spotting or bleeding from your vagina.  You have severe abdominal pain or cramping.  You have rapid weight loss or weight gain.  You have  shortness of breath with chest pain.  You notice sudden or extreme swelling of your face, hands, ankles, feet, or legs.  Your baby makes fewer than 10 movements in 2 hours.  You have severe headaches that do not go away with medicine.  You have vision changes. Summary  The third trimester is from week 29 through week 40, months 7 through 9. The third trimester is a time when the unborn baby (fetus)   is growing rapidly.  During the third trimester, your discomfort may increase as you and your baby continue to gain weight. You may have abdominal, leg, and back pain, sleeping problems, and an increased need to urinate.  During the third trimester your breasts will keep growing and they will continue to become tender. A yellow fluid (colostrum) may leak from your breasts. This is the first milk you are producing for your baby.  False labor is a condition in which you feel small, irregular tightenings of the muscles in the womb (contractions) that eventually go away. These are called Braxton Hicks contractions. Contractions may last for hours, days, or even weeks before true labor sets in.  Signs of labor can include: abdominal cramps; regular contractions that start at 10 minutes apart and become stronger and more frequent with time; watery or bloody mucus discharge that comes from the vagina; increased pelvic pressure and dull back pain; and leaking of amniotic fluid. This information is not intended to replace advice given to you by your health care provider. Make sure you discuss any questions you have with your health care provider. Document Released: 07/23/2001 Document Revised: 01/04/2016 Document Reviewed: 09/29/2012 Elsevier Interactive Patient Education  2017 [employer].  Prenatal Care WHAT IS PRENATAL CARE? Prenatal care is the process of caring for a pregnant woman before she gives birth. Prenatal care makes sure that she and her baby remain as healthy as possible throughout  pregnancy. Prenatal care may be provided by a midwife, family practice health care provider, or a childbirth and pregnancy specialist (obstetrician). Prenatal care may include physical examinations, testing, treatments, and education on nutrition, lifestyle, and social support services. WHY IS PRENATAL CARE SO IMPORTANT? Early and consistent prenatal care increases the chance that you and your baby will remain healthy throughout your pregnancy. This type of care also decreases a baby's risk of being born too early (prematurely), or being born smaller than expected (small for gestational age). Any underlying medical conditions you may have that could pose a risk during your pregnancy are discussed during prenatal care visits. You will also be monitored regularly for any new conditions that may arise during your pregnancy so they can be treated quickly and effectively. WHAT HAPPENS DURING PRENATAL CARE VISITS? Prenatal care visits may include the following: Discussion Tell your health care provider about any new signs or symptoms you have experienced since your last visit. These might include:  Nausea or vomiting.  Increased or decreased level of energy.  Difficulty sleeping.  Back or leg pain.  Weight changes.  Frequent urination.  Shortness of breath with physical activity.  Changes in your skin, such as the development of a rash or itchiness.  Vaginal discharge or bleeding.  Feelings of excitement or nervousness.  Changes in your baby's movements. You may want to write down any questions or topics you want to discuss with your health care provider and bring them with you to your appointment. Examination During your first prenatal care visit, you will likely have a complete physical exam. Your health care provider will often examine your vagina, cervix, and the position of your uterus, as well as check your heart, lungs, and other body systems. As your pregnancy progresses, your health  care provider will measure the size of your uterus and your baby's position inside your uterus. He or she may also examine you for early signs of labor. Your prenatal visits may also include checking your blood pressure and, after about 10-12 weeks of pregnancy,  listening to your baby's heartbeat. Testing Regular testing often includes:  Urinalysis. This checks your urine for glucose, protein, or signs of infection.  Blood count. This checks the levels of white and red blood cells in your body.  Tests for sexually transmitted infections (STIs). Testing for STIs at the beginning of pregnancy is routinely done and is required in many states.  Antibody testing. You will be checked to see if you are immune to certain illnesses, such as rubella, that can affect a developing fetus.  Glucose screen. Around 24-28 weeks of pregnancy, your blood glucose level will be checked for signs of gestational diabetes. Follow-up tests may be recommended.  Group B strep. This is a bacteria that is commonly found inside a woman's vagina. This test will inform your health care provider if you need an antibiotic to reduce the amount of this bacteria in your body prior to labor and childbirth.  Ultrasound. Many pregnant women undergo an ultrasound screening around 18-20 weeks of pregnancy to evaluate the health of the fetus and check for any developmental abnormalities.  HIV (human immunodeficiency virus) testing. Early in your pregnancy, you will be screened for HIV. If you are at high risk for HIV, this test may be repeated during your third trimester of pregnancy. You may be offered other testing based on your age, personal or family medical history, or other factors. HOW OFTEN SHOULD I PLAN TO SEE MY HEALTH CARE PROVIDER FOR PRENATAL CARE? Your prenatal care check-up schedule depends on any medical conditions you have before, or develop during, your pregnancy. If you do not have any underlying medical conditions,  you will likely be seen for checkups:  Monthly, during the first 6 months of pregnancy.  Twice a month during months 7 and 8 of pregnancy.  Weekly starting in the 9th month of pregnancy and until delivery. If you develop signs of early labor or other concerning signs or symptoms, you may need to see your health care provider more often. Ask your health care provider what prenatal care schedule is best for you. WHAT CAN I DO TO KEEP MYSELF AND MY BABY AS HEALTHY AS POSSIBLE DURING MY PREGNANCY?  Take a prenatal vitamin containing 400 micrograms (0.4 mg) of folic acid every day. Your health care provider may also ask you to take additional vitamins such as iodine, vitamin D, iron, copper, and zinc.  Take 1500-2000 mg of calcium daily starting at your 20th week of pregnancy until you deliver your baby.  Make sure you are up to date on your vaccinations. Unless directed otherwise by your health care provider:  You should receive a tetanus, diphtheria, and pertussis (Tdap) vaccination between the 27th and 36th week of your pregnancy, regardless of when your last Tdap immunization occurred. This helps protect your baby from whooping cough (pertussis) after he or she is born.  You should receive an annual inactivated influenza vaccine (IIV) to help protect you and your baby from influenza. This can be done at any point during your pregnancy.  Eat a well-rounded diet that includes:  Fresh fruits and vegetables.  Lean proteins.  Calcium-rich foods such as milk, yogurt, hard cheeses, and dark, leafy greens.  Whole grain breads.  Do noteat seafood high in mercury, including:  Swordfish.  Tilefish.  Shark.  King mackerel.  More than 6 oz tuna per week.  Do not eat:  Raw or undercooked meats or eggs.  Unpasteurized foods, such as soft cheeses (brie, blue, or feta), juices, and milks.  Lunch meats.  Hot dogs that have not been heated until they are steaming.  Drink enough water  to keep your urine clear or pale yellow. For many women, this may be 10 or more 8 oz glasses of water each day. Keeping yourself hydrated helps deliver nutrients to your baby and may prevent the start of pre-term uterine contractions.  Do not use any tobacco products including cigarettes, chewing tobacco, or electronic cigarettes. If you need help quitting, ask your health care provider.  Do not drink beverages containing alcohol. No safe level of alcohol consumption during pregnancy has been determined.  Do not use any illegal drugs. These can harm your developing baby or cause a miscarriage.  Ask your health care provider or pharmacist before taking any prescription or over-the-counter medicines, herbs, or supplements.  Limit your caffeine intake to no more than 200 mg per day.  Exercise. Unless told otherwise by your health care provider, try to get 30 minutes of moderate exercise most days of the week. Do not  do high-impact activities, contact sports, or activities with a high risk of falling, such as horseback riding or downhill skiing.  Get plenty of rest.  Avoid anything that raises your body temperature, such as hot tubs and saunas.  If you own a cat, do not empty its litter box. Bacteria contained in cat feces can cause an infection called toxoplasmosis. This can result in serious harm to the fetus.  Stay away from chemicals such as insecticides, lead, mercury, and cleaning or paint products that contain solvents.  Do not have any X-rays taken unless medically necessary.  Take a childbirth and breastfeeding preparation class. Ask your health care provider if you need a referral or recommendation. This information is not intended to replace advice given to you by your health care provider. Make sure you discuss any questions you have with your health care provider. Document Released: 08/01/2003 Document Revised: 01/01/2016 Document Reviewed: 10/13/2013 Elsevier Interactive Patient  Education  2017 [employer].

## 2016-08-06 NOTE — Progress Notes (Signed)
Subjective:    Angela Cortez is a 25 y.o. female being seen today for her obstetrical visit. She is at [redacted]w[redacted]d gestation. Patient reports no complaints. Fetal movement: normal.  Has not been to the office for 3 months: NST ordered.  Reports eating a bit of a biscuit sandwich and spitting it out.  CBG ordered for fasting. CBG was 100, reports drinking juice and soda this morning.   Problem List Items Addressed This Visit      Other   Supervision of normal pregnancy, antepartum   Relevant Orders   [place] [facility] FOLLOW UP   Limited prenatal care in second trimester   Relevant Orders   [place] [facility] FOLLOW UP    Other Visit Diagnoses    Encounter for supervision of other normal pregnancy in third trimester    -  Primary   Relevant Orders   [place] [facility] FOLLOW UP     Patient Active Problem List   Diagnosis Date Noted  . Limited prenatal care in second trimester 08/06/2016  . Supervision of normal pregnancy, antepartum 03/29/2016   Objective:    BP 98/67   Pulse 90   Wt 172 lb (78 kg)   LMP 01/22/2016 (Exact Date)   BMI 29.52 kg/m  FHT:  140 BPM  Uterine Size: 27 cm and size equals dates  Presentation: cephalic    NST: + accels, no decels, moderate variability, Cat. 1 tracing. No contractions on toco.    Assessment:    Pregnancy @ [redacted]w[redacted]d weeks   Limited PNC: NST today  Reactive NST  Plan:     labs reviewed, problem list updated Consent signed. GBS planning TDAP offered, declined   Pediatrician: discussed, [facility].  Infant feeding: plans to breastfeed. Maternity leave: n/a. Cigarette smoking: never smoked.  Orders Placed This Encounter  Procedures  . [place] [facility] FOLLOW UP    Standing Status:   Future    Standing Expiration Date:   10/07/2017    Order Specific Question:   Reason for Exam (SYMPTOM  OR DIAGNOSIS REQUIRED)    Answer:   growth, cardiac    Order Specific Question:   Preferred imaging location?    Answer:   [facility]   No orders of the defined  types were placed in this encounter.  Follow up in 2 Weeks, f/u with 2 hour OGTT ASAP.

## 2016-08-12 NOTE — L&D Delivery Note (Signed)
Delivery Note At 2:34 PM a viable female was delivered via  (Presentation:vertex ; oa ).  APGAR:8 , 9; weight  .   Placenta status:spont ,shultz .  Cord:3vc  with the following complications: none.  Cord pH: n/a  Anesthesia:  epidural Episiotomy:  none Lacerations: none  Suture Repair: n/a Est. Blood Loss 100(mL):    Mom to postpartum.  Baby to Couplet care / Skin to Skin.  [name] 10/27/2016, 2:45 PM

## 2016-08-14 ENCOUNTER — Ambulatory Visit (HOSPITAL_COMMUNITY): Payer: Medicaid Other

## 2016-08-15 ENCOUNTER — Other Ambulatory Visit: Payer: Medicaid Other

## 2016-08-19 ENCOUNTER — Ambulatory Visit (HOSPITAL_COMMUNITY)
Admission: RE | Admit: 2016-08-19 | Discharge: 2016-08-19 | Disposition: A | Discharge status: Home / Self Care | Payer: Medicaid Other | Source: Ambulatory Visit | Attending: Certified Nurse Midwife | Admitting: Certified Nurse Midwife

## 2016-08-19 ENCOUNTER — Other Ambulatory Visit: Payer: Medicaid Other

## 2016-08-19 DIAGNOSIS — O0932 Supervision of pregnancy with insufficient antenatal care, second trimester: Secondary | ICD-10-CM

## 2016-08-19 DIAGNOSIS — Z362 Encounter for other antenatal screening follow-up: Secondary | ICD-10-CM | POA: Diagnosis present

## 2016-08-19 DIAGNOSIS — Z3A30 30 weeks gestation of pregnancy: Secondary | ICD-10-CM | POA: Diagnosis not present

## 2016-08-19 DIAGNOSIS — Z3480 Encounter for supervision of other normal pregnancy, unspecified trimester: Secondary | ICD-10-CM

## 2016-08-19 DIAGNOSIS — Z3483 Encounter for supervision of other normal pregnancy, third trimester: Secondary | ICD-10-CM

## 2016-08-22 ENCOUNTER — Encounter: Payer: Medicaid Other | Admitting: Obstetrics & Gynecology

## 2016-08-22 ENCOUNTER — Other Ambulatory Visit: Payer: Self-pay | Admitting: Certified Nurse Midwife

## 2016-08-22 DIAGNOSIS — Z3480 Encounter for supervision of other normal pregnancy, unspecified trimester: Secondary | ICD-10-CM

## 2016-09-30 ENCOUNTER — Inpatient Hospital Stay (HOSPITAL_COMMUNITY)
Admission: AD | Admit: 2016-09-30 | Discharge: 2016-09-30 | Disposition: A | Discharge status: Home / Self Care | Payer: Medicaid Other | Source: Ambulatory Visit | Attending: Obstetrics & Gynecology | Admitting: Obstetrics & Gynecology

## 2016-09-30 ENCOUNTER — Encounter (HOSPITAL_COMMUNITY): Payer: Self-pay | Admitting: *Deleted

## 2016-09-30 DIAGNOSIS — O26893 Other specified pregnancy related conditions, third trimester: Secondary | ICD-10-CM | POA: Insufficient documentation

## 2016-09-30 DIAGNOSIS — Z3A36 36 weeks gestation of pregnancy: Secondary | ICD-10-CM | POA: Insufficient documentation

## 2016-09-30 DIAGNOSIS — K5900 Constipation, unspecified: Secondary | ICD-10-CM | POA: Diagnosis not present

## 2016-09-30 DIAGNOSIS — K5909 Other constipation: Secondary | ICD-10-CM

## 2016-09-30 DIAGNOSIS — Z87891 Personal history of nicotine dependence: Secondary | ICD-10-CM | POA: Insufficient documentation

## 2016-09-30 DIAGNOSIS — O368131 Decreased fetal movements, third trimester, fetus 1: Secondary | ICD-10-CM

## 2016-09-30 DIAGNOSIS — O368130 Decreased fetal movements, third trimester, not applicable or unspecified: Secondary | ICD-10-CM | POA: Insufficient documentation

## 2016-09-30 LAB — URINALYSIS, ROUTINE W REFLEX MICROSCOPIC
Bilirubin Urine: NEGATIVE
Glucose, UA: NEGATIVE mg/dL
Hgb urine dipstick: NEGATIVE
KETONES UR: NEGATIVE mg/dL
Nitrite: NEGATIVE
PROTEIN: NEGATIVE mg/dL
Specific Gravity, Urine: 1.015 (ref 1.005–1.030)
pH: 7 (ref 5.0–8.0)

## 2016-09-30 MED ORDER — FLEET ENEMA 7-19 GM/118ML RE ENEM
1.0000 | ENEMA | Freq: Once | RECTAL | Status: AC
  Administered 2016-09-30: 1 via RECTAL

## 2016-09-30 NOTE — Discharge Instructions (Signed)

## 2016-09-30 NOTE — MAU Provider Note (Signed)
Patient Angela Cortez is a 26 year old G2P1001 at 65 weeks here with complaints of constipation and decreased fetal movements. She says that she has not had a bowel movement in a week.  History     CSN: [id number]  Arrival date and time: 09/30/16 1114   First Provider Initiated Contact with Patient 09/30/16 1232      Chief Complaint  Patient presents with  . Abdominal Pain  . Constipation  . Decreased Fetal Movement   Abdominal Pain  This is a new problem. The current episode started in the past 7 days. The onset quality is gradual. The problem occurs intermittently. The problem has been gradually worsening. The pain is located in the rectum. The pain is at a severity of 7/10. The quality of the pain is aching. The abdominal pain does not radiate. Associated symptoms include constipation. The pain is relieved by bowel movements. She has tried nothing for the symptoms.  Constipation  This is a new problem. The current episode started in the past 7 days. The problem is unchanged. Her stool frequency is 1 time per week or less. The patient is not on a high fiber diet. She does not exercise regularly. Associated symptoms include abdominal pain.    OB History    Gravida Para Term Preterm AB Living   2 1 1     1    SAB TAB Ectopic Multiple Live Births                  Past Medical History:  Diagnosis Date  . No pertinent past medical history     Past Surgical History:  Procedure Laterality Date  . ACROMIO-CLAVICULAR JOINT REPAIR    . CLAVICLE SURGERY      Family History  Problem Relation Age of Onset  . Diabetes Father   . Hypertension Father   . Cancer Paternal Uncle   . Cancer Paternal Grandmother     Social History  Substance Use Topics  . Smoking status: Former Smoker    Packs/day: 0.25    Types: Cigarettes    Quit date: 02/04/2016  . Smokeless tobacco: Never Used  . Alcohol use No     Comment: socially previous to pregnancy    Allergies: No Known  Allergies  Prescriptions Prior to Admission  Medication Sig Dispense Refill Last Dose  . Prenatal MV & Min w/FA-DHA (PRENATAL ADULT GUMMY/DHA/FA PO) Take 2 each by mouth daily.    Past Week at Unknown time  . Elastic Bandages & Supports (FUTURO ABDOMINAL SUPPORT) MISC 1 each by Does not apply route daily. (Patient not taking: Reported on 08/06/2016) 1 each 0 Not Taking    Review of Systems  Constitutional: Negative.   HENT: Negative.   Eyes: Negative.   Respiratory: Negative.   Gastrointestinal: Positive for abdominal pain and constipation.  Genitourinary: Negative.   Musculoskeletal: Negative.   Hematological: Negative.   Psychiatric/Behavioral: Negative.    Physical Exam   Blood pressure 112/63, pulse 97, temperature 98 F (36.7 C), temperature source Oral, resp. rate 17, height 5\' 4"  (1.626 m), weight 76.2 kg (168 lb), last menstrual period 01/22/2016, unknown if currently breastfeeding.  Physical Exam  Constitutional: She is oriented to person, place, and time. She appears well-developed.  HENT:  Head: Normocephalic.  Neck: Normal range of motion.  Respiratory: Effort normal.  GI: Soft. She exhibits no mass. There is no tenderness. There is no rebound and no guarding.  Genitourinary:  Genitourinary Comments: Cervix is long, closed  and thick.   Musculoskeletal: Normal range of motion.  Neurological: She is alert and oriented to person, place, and time.  Skin: Skin is warm and dry.  Psychiatric: She has a normal mood and affect.    MAU Course  Procedures  MDM -Patient was given a Fleets enema in MAU but refused to use the MAU bathroom. SHe wants to go home and use her home bathroom. SHe feels a strong urge to defecate.  UA: negative except for [place] NST: 120, moderate variability, no decelerations, positive accelerations.  Assessment and Plan   1. Other constipation    2, D/C home in stable condition with recommendation to increase fiber in her diet, drink water  and exercise. 3. Encouraged patient to make a follow up prenatal visit at [facility] this week, emphasizing the importance of prenatal care in pregnancy and especially as her due date approaches.  4. Instructed her to return to the MAU if she feels decreased fetal movements, bleeding or leaking of fluid.   Mervyn Skeeters [place] 09/30/2016, 1:40 PM

## 2016-09-30 NOTE — MAU Note (Signed)
Pt presents to MAU with complaints of lower abdominal pain,decrease in fetal movement and states she has not been able to have a bowel movement in a week.

## 2016-10-03 ENCOUNTER — Inpatient Hospital Stay (HOSPITAL_COMMUNITY)
Admission: AD | Admit: 2016-10-03 | Discharge: 2016-10-03 | Disposition: A | Discharge status: Home / Self Care | Payer: Medicaid Other | Source: Ambulatory Visit | Attending: Obstetrics & Gynecology | Admitting: Obstetrics & Gynecology

## 2016-10-03 ENCOUNTER — Encounter (HOSPITAL_COMMUNITY): Payer: Self-pay

## 2016-10-03 DIAGNOSIS — O9989 Other specified diseases and conditions complicating pregnancy, childbirth and the puerperium: Secondary | ICD-10-CM

## 2016-10-03 DIAGNOSIS — N898 Other specified noninflammatory disorders of vagina: Secondary | ICD-10-CM | POA: Diagnosis not present

## 2016-10-03 DIAGNOSIS — Z87891 Personal history of nicotine dependence: Secondary | ICD-10-CM | POA: Diagnosis not present

## 2016-10-03 DIAGNOSIS — O4703 False labor before 37 completed weeks of gestation, third trimester: Secondary | ICD-10-CM | POA: Diagnosis not present

## 2016-10-03 DIAGNOSIS — O26893 Other specified pregnancy related conditions, third trimester: Secondary | ICD-10-CM | POA: Diagnosis not present

## 2016-10-03 DIAGNOSIS — Z3480 Encounter for supervision of other normal pregnancy, unspecified trimester: Secondary | ICD-10-CM

## 2016-10-03 DIAGNOSIS — Z3A36 36 weeks gestation of pregnancy: Secondary | ICD-10-CM | POA: Diagnosis not present

## 2016-10-03 LAB — OB RESULTS CONSOLE GBS: GBS: NEGATIVE

## 2016-10-03 LAB — POCT FERN TEST: POCT Fern Test: NEGATIVE

## 2016-10-03 MED ORDER — ZOLPIDEM TARTRATE 5 MG PO TABS
5.0000 mg | ORAL_TABLET | Freq: Once | ORAL | Status: AC
  Administered 2016-10-03: 5 mg via ORAL
  Filled 2016-10-03: qty 1

## 2016-10-03 MED ORDER — FENTANYL CITRATE (PF) 100 MCG/2ML IJ SOLN: 100.0000 ug | Freq: Once | INTRAMUSCULAR | Status: DC | PRN

## 2016-10-03 MED ORDER — OXYCODONE-ACETAMINOPHEN 5-325 MG PO TABS
1.0000 | ORAL_TABLET | Freq: Once | ORAL | Status: AC
  Administered 2016-10-03: 1 via ORAL
  Filled 2016-10-03: qty 1

## 2016-10-03 NOTE — MAU Note (Signed)
Pt presents complaining of contractions and possible SROM at 2330. Denies bleeding. Reports good fetal movement.

## 2016-10-03 NOTE — MAU Provider Note (Signed)
  History     CSN: [id number]  Arrival date and time: 10/03/16 0001   None     Chief Complaint  Patient presents with  . Contractions  . Rupture of Membranes   HPI Ms Brohl is a 26yo G2P1001 @ 36.3wks who presents for eval of leaking fluid and also reg ctx since 2330. Denies bldg. No H/A, N/V or visual disturbances. Her preg has been followed by the [place] service and has been remarkable for missed visits between Sept-Dec.   OB History    Gravida Para Term Preterm AB Living   2 1 1     1    SAB TAB Ectopic Multiple Live Births                  Past Medical History:  Diagnosis Date  . No pertinent past medical history     Past Surgical History:  Procedure Laterality Date  . ACROMIO-CLAVICULAR JOINT REPAIR    . CLAVICLE SURGERY      Family History  Problem Relation Age of Onset  . Diabetes Father   . Hypertension Father   . Cancer Paternal Uncle   . Cancer Paternal Grandmother     Social History  Substance Use Topics  . Smoking status: Former Smoker    Packs/day: 0.25    Types: Cigarettes    Quit date: 02/04/2016  . Smokeless tobacco: Never Used  . Alcohol use No     Comment: socially previous to pregnancy    Allergies: No Known Allergies  Prescriptions Prior to Admission  Medication Sig Dispense Refill Last Dose  . Elastic Bandages & Supports (FUTURO ABDOMINAL SUPPORT) MISC 1 each by Does not apply route daily. (Patient not taking: Reported on 08/06/2016) 1 each 0 Not Taking  . Prenatal MV & Min w/FA-DHA (PRENATAL ADULT GUMMY/DHA/FA PO) Take 2 each by mouth daily.    Past Week at Unknown time    Review of Systems No other pertinents other than what is listed in HPI Physical Exam   Blood pressure 122/76, pulse 109, temperature 97.9 F (36.6 C), temperature source Oral, resp. rate 18, last menstrual period 01/22/2016, unknown if currently breastfeeding.  Physical Exam  Constitutional: She is oriented to person, place, and time. She appears  well-developed.  HENT:  Head: Normocephalic.  Neck: Normal range of motion.  Cardiovascular:  Sl tachycardic  Respiratory: Effort normal.  GI:  EFM 130s, +accels, no decels Ctx q 3-6 mins  Genitourinary:  Genitourinary Comments: SSE: neg pool, neg fern, cx unchanged 1/70/-3  Musculoskeletal: Normal range of motion.  Neurological: She is alert and oriented to person, place, and time.  Skin: Skin is warm and dry.  Psychiatric: She has a normal mood and affect. Her behavior is normal. Thought content normal.    MAU Course  Procedures  MDM SSE GBS culture collected  Assessment and Plan  IUP@36 .3wks Vaginal d/c in preg Brax Hicks  D/C home GBS culture collected as pt very concerned about having the result in time as her next OB visit is on 10/15/16 F/U as scheduled at next visit  [place], [place] 10/03/2016, 1:07 AM

## 2016-10-03 NOTE — Discharge Instructions (Signed)
Braxton Hicks Contractions °Contractions of the uterus can occur throughout pregnancy. Contractions are not always a sign that you are in labor.  °WHAT ARE BRAXTON HICKS CONTRACTIONS?  °Contractions that occur before labor are called Braxton Hicks contractions, or false labor. Toward the end of pregnancy (32-34 weeks), these contractions can develop more often and may become more forceful. This is not true labor because these contractions do not result in opening (dilatation) and thinning of the cervix. They are sometimes difficult to tell apart from true labor because these contractions can be forceful and people have different pain tolerances. You should not feel embarrassed if you go to the hospital with false labor. Sometimes, the only way to tell if you are in true labor is for your health care provider to look for changes in the cervix. °If there are no prenatal problems or other health problems associated with the pregnancy, it is completely safe to be sent home with false labor and await the onset of true labor. °HOW CAN YOU TELL THE DIFFERENCE BETWEEN TRUE AND FALSE LABOR? °False Labor  °· The contractions of false labor are usually shorter and not as hard as those of true labor.   °· The contractions are usually irregular.   °· The contractions are often felt in the front of the lower abdomen and in the groin.   °· The contractions may go away when you walk around or change positions while lying down.   °· The contractions get weaker and are shorter lasting as time goes on.   °· The contractions do not usually become progressively stronger, regular, and closer together as with true labor.   °True Labor  °· Contractions in true labor last 30-70 seconds, become very regular, usually become more intense, and increase in frequency.   °· The contractions do not go away with walking.   °· The discomfort is usually felt in the top of the uterus and spreads to the lower abdomen and low back.   °· True labor can be  determined by your health care provider with an exam. This will show that the cervix is dilating and getting thinner.   °WHAT TO REMEMBER °· Keep up with your usual exercises and follow other instructions given by your health care provider.   °· Take medicines as directed by your health care provider.   °· Keep your regular prenatal appointments.   °· Eat and drink lightly if you think you are going into labor.   °· If Braxton Hicks contractions are making you uncomfortable:   °¨ Change your position from lying down or resting to walking, or from walking to resting.   °¨ Sit and rest in a tub of warm water.   °¨ Drink 2-3 glasses of water. Dehydration may cause these contractions.   °¨ Do slow and deep breathing several times an hour.   °WHEN SHOULD I SEEK IMMEDIATE MEDICAL CARE? °Seek immediate medical care if: °· Your contractions become stronger, more regular, and closer together.   °· You have fluid leaking or gushing from your vagina.   °· You have a fever.   °· You pass blood-tinged mucus.   °· You have vaginal bleeding.   °· You have continuous abdominal pain.   °· You have low back pain that you never had before.   °· You feel your baby's head pushing down and causing pelvic pressure.   °· Your baby is not moving as much as it used to.   °This information is not intended to replace advice given to you by your health care provider. Make sure you discuss any questions you have with your health care   provider. °Document Released: 07/29/2005 Document Revised: 11/20/2015 Document Reviewed: 05/10/2013 °Elsevier Interactive Patient Education © 2017 [employer] Inc[employer] ° °

## 2016-10-05 LAB — CULTURE, BETA STREP (GROUP B ONLY)

## 2016-10-07 ENCOUNTER — Encounter (HOSPITAL_COMMUNITY): Payer: Self-pay

## 2016-10-07 ENCOUNTER — Inpatient Hospital Stay (HOSPITAL_COMMUNITY)
Admission: AD | Admit: 2016-10-07 | Discharge: 2016-10-07 | Disposition: A | Discharge status: Home / Self Care | Payer: Medicaid Other | Source: Ambulatory Visit | Attending: Obstetrics and Gynecology | Admitting: Obstetrics and Gynecology

## 2016-10-07 DIAGNOSIS — Z3A38 38 weeks gestation of pregnancy: Secondary | ICD-10-CM | POA: Diagnosis not present

## 2016-10-07 DIAGNOSIS — O26893 Other specified pregnancy related conditions, third trimester: Secondary | ICD-10-CM | POA: Diagnosis not present

## 2016-10-07 DIAGNOSIS — Z3480 Encounter for supervision of other normal pregnancy, unspecified trimester: Secondary | ICD-10-CM

## 2016-10-07 DIAGNOSIS — O479 False labor, unspecified: Secondary | ICD-10-CM

## 2016-10-07 NOTE — MAU Note (Signed)
I have communicated with Dr. Yisroel Ramming and reviewed vital signs:  Vitals:   10/07/16 1436 10/07/16 1627  BP: 108/64 111/70  Pulse: 100 91  Resp: 16 18  Temp: 98.7 F (37.1 C) 98.4 F (36.9 C)    Vaginal exam:  Dilation: 1 Effacement (%): 30 Cervical Position: Posterior Station: -3 Exam by:: Louanne Skye RN,   Also reviewed contraction pattern and that non-stress test is reactive.  It has been documented that patient is contracting occassionally with no cervical change since her last visit on Thursday  not indicating active labor.  Patient denies any other complaints.  Based on this report provider has given order for discharge.  A discharge diagnosis will be entered by a provider.  Labor discharge instructions reviewed with patient.

## 2016-10-07 NOTE — Discharge Instructions (Signed)
Braxton Hicks Contractions °Contractions of the uterus can occur throughout pregnancy. Contractions are not always a sign that you are in labor.  °WHAT ARE BRAXTON HICKS CONTRACTIONS?  °Contractions that occur before labor are called Braxton Hicks contractions, or false labor. Toward the end of pregnancy (32-34 weeks), these contractions can develop more often and may become more forceful. This is not true labor because these contractions do not result in opening (dilatation) and thinning of the cervix. They are sometimes difficult to tell apart from true labor because these contractions can be forceful and people have different pain tolerances. You should not feel embarrassed if you go to the hospital with false labor. Sometimes, the only way to tell if you are in true labor is for your health care provider to look for changes in the cervix. °If there are no prenatal problems or other health problems associated with the pregnancy, it is completely safe to be sent home with false labor and await the onset of true labor. °HOW CAN YOU TELL THE DIFFERENCE BETWEEN TRUE AND FALSE LABOR? °False Labor  °· The contractions of false labor are usually shorter and not as hard as those of true labor.   °· The contractions are usually irregular.   °· The contractions are often felt in the front of the lower abdomen and in the groin.   °· The contractions may go away when you walk around or change positions while lying down.   °· The contractions get weaker and are shorter lasting as time goes on.   °· The contractions do not usually become progressively stronger, regular, and closer together as with true labor.   °True Labor  °· Contractions in true labor last 30-70 seconds, become very regular, usually become more intense, and increase in frequency.   °· The contractions do not go away with walking.   °· The discomfort is usually felt in the top of the uterus and spreads to the lower abdomen and low back.   °· True labor can be  determined by your health care provider with an exam. This will show that the cervix is dilating and getting thinner.   °WHAT TO REMEMBER °· Keep up with your usual exercises and follow other instructions given by your health care provider.   °· Take medicines as directed by your health care provider.   °· Keep your regular prenatal appointments.   °· Eat and drink lightly if you think you are going into labor.   °· If Braxton Hicks contractions are making you uncomfortable:   °¨ Change your position from lying down or resting to walking, or from walking to resting.   °¨ Sit and rest in a tub of warm water.   °¨ Drink 2-3 glasses of water. Dehydration may cause these contractions.   °¨ Do slow and deep breathing several times an hour.   °WHEN SHOULD I SEEK IMMEDIATE MEDICAL CARE? °Seek immediate medical care if: °· Your contractions become stronger, more regular, and closer together.   °· You have fluid leaking or gushing from your vagina.   °· You have a fever.   °· You pass blood-tinged mucus.   °· You have vaginal bleeding.   °· You have continuous abdominal pain.   °· You have low back pain that you never had before.   °· You feel your baby's head pushing down and causing pelvic pressure.   °· Your baby is not moving as much as it used to.   °This information is not intended to replace advice given to you by your health care provider. Make sure you discuss any questions you have with your health care   provider. °Document Released: 07/29/2005 Document Revised: 11/20/2015 Document Reviewed: 05/10/2013 °Elsevier Interactive Patient Education © 2017 [employer] Inc[employer] ° °

## 2016-10-07 NOTE — MAU Note (Signed)
Urine sent to lab @1521 

## 2016-10-07 NOTE — MAU Note (Signed)
Was here last week, was told she was 1 cm, 2 days ago her mucous plug came out. No bleeding, no leaking,no pain.

## 2016-10-15 ENCOUNTER — Encounter (HOSPITAL_COMMUNITY): Payer: Self-pay | Admitting: *Deleted

## 2016-10-15 ENCOUNTER — Inpatient Hospital Stay (HOSPITAL_COMMUNITY)
Admission: AD | Admit: 2016-10-15 | Discharge: 2016-10-15 | Disposition: A | Discharge status: Home / Self Care | Payer: Medicaid Other | Source: Ambulatory Visit | Attending: Obstetrics & Gynecology | Admitting: Obstetrics & Gynecology

## 2016-10-15 ENCOUNTER — Ambulatory Visit (INDEPENDENT_AMBULATORY_CARE_PROVIDER_SITE_OTHER): Payer: Medicaid Other | Admitting: Certified Nurse Midwife

## 2016-10-15 VITALS — BP 120/77 | HR 102 | Wt 167.6 lb

## 2016-10-15 DIAGNOSIS — Z87891 Personal history of nicotine dependence: Secondary | ICD-10-CM | POA: Insufficient documentation

## 2016-10-15 DIAGNOSIS — Z3A38 38 weeks gestation of pregnancy: Secondary | ICD-10-CM | POA: Diagnosis not present

## 2016-10-15 DIAGNOSIS — O9989 Other specified diseases and conditions complicating pregnancy, childbirth and the puerperium: Secondary | ICD-10-CM

## 2016-10-15 DIAGNOSIS — F1290 Cannabis use, unspecified, uncomplicated: Secondary | ICD-10-CM | POA: Diagnosis not present

## 2016-10-15 DIAGNOSIS — O0932 Supervision of pregnancy with insufficient antenatal care, second trimester: Secondary | ICD-10-CM

## 2016-10-15 DIAGNOSIS — F1210 Cannabis abuse, uncomplicated: Secondary | ICD-10-CM | POA: Insufficient documentation

## 2016-10-15 DIAGNOSIS — Z3689 Encounter for other specified antenatal screening: Secondary | ICD-10-CM

## 2016-10-15 DIAGNOSIS — Z3480 Encounter for supervision of other normal pregnancy, unspecified trimester: Secondary | ICD-10-CM

## 2016-10-15 DIAGNOSIS — O99323 Drug use complicating pregnancy, third trimester: Secondary | ICD-10-CM | POA: Insufficient documentation

## 2016-10-15 DIAGNOSIS — O0933 Supervision of pregnancy with insufficient antenatal care, third trimester: Secondary | ICD-10-CM

## 2016-10-15 NOTE — Discharge Instructions (Signed)
Fetal Movement Counts  Patient Name: ________________________________________________ Patient Due Date: ____________________  What is a fetal movement count?  A fetal movement count is the number of times that you feel your baby move during a certain amount of time. This may also be called a fetal kick count. A fetal movement count is recommended for every pregnant woman. You may be asked to start counting fetal movements as early as week 28 of your pregnancy.  Pay attention to when your baby is most active. You may notice your baby's sleep and wake cycles. You may also notice things that make your baby move more. You should do a fetal movement count:  · When your baby is normally most active.  · At the same time each day.    A good time to count movements is while you are resting, after having something to eat and drink.  How do I count fetal movements?  1. Find a quiet, comfortable area. Sit, or lie down on your side.  2. Write down the date, the start time and stop time, and the number of movements that you felt between those two times. Take this information with you to your health care visits.  3. For 2 hours, count kicks, flutters, swishes, rolls, and jabs. You should feel at least 10 movements during 2 hours.  4. You may stop counting after you have felt 10 movements.  5. If you do not feel 10 movements in 2 hours, have something to eat and drink. Then, keep resting and counting for 1 hour. If you feel at least 4 movements during that hour, you may stop counting.  Contact a health care provider if:  · You feel fewer than 4 movements in 2 hours.  · Your baby is not moving like he or she usually does.  Date: ____________ Start time: ____________ Stop time: ____________ Movements: ____________  Date: ____________ Start time: ____________ Stop time: ____________ Movements: ____________  Date: ____________ Start time: ____________ Stop time: ____________ Movements: ____________  Date: ____________ Start time:  ____________ Stop time: ____________ Movements: ____________  Date: ____________ Start time: ____________ Stop time: ____________ Movements: ____________  Date: ____________ Start time: ____________ Stop time: ____________ Movements: ____________  Date: ____________ Start time: ____________ Stop time: ____________ Movements: ____________  Date: ____________ Start time: ____________ Stop time: ____________ Movements: ____________  Date: ____________ Start time: ____________ Stop time: ____________ Movements: ____________  This information is not intended to replace advice given to you by your health care provider. Make sure you discuss any questions you have with your health care provider.  Document Released: 08/28/2006 Document Revised: 03/27/2016 Document Reviewed: 09/07/2015  Elsevier Interactive Patient Education © 2017 Elsevier Inc.

## 2016-10-15 NOTE — Progress Notes (Signed)
   PRENATAL VISIT NOTE  Subjective:  Angela Cortez is a 26 y.o. G2P1001 at [redacted]w[redacted]d being seen today for ongoing prenatal care.  She is currently monitored for the following issues for this low-risk pregnancy and has Supervision of normal pregnancy, antepartum; Limited prenatal care in second trimester; Limited prenatal care in third trimester; and Mild tetrahydrocannabinol (THC) abuse on her problem list.  Patient reports no complaints.  Contractions: Not present. Vag. Bleeding: None.  Movement: Present. Denies leaking of fluid.   The following portions of the patient's history were reviewed and updated as appropriate: allergies, current medications, past family history, past medical history, past social history, past surgical history and problem list. Problem list updated.  Objective:   Vitals:   10/15/16 1511  BP: 120/77  Pulse: (!) 102  Weight: 167 lb 9.6 oz (76 kg)    Fetal Status: Fetal Heart Rate (bpm): NST Fundal Height: 35 cm Movement: Present     General:  Alert, oriented and cooperative. Patient is in no acute distress.  Skin: Skin is warm and dry. No rash noted.   Cardiovascular: Normal heart rate noted  Respiratory: Normal respiratory effort, no problems with respiration noted  Abdomen: Soft, gravid, appropriate for gestational age. Pain/Pressure: Present     Pelvic:  Cervical exam deferred        Extremities: Normal range of motion.  Edema: None  Mental Status: Normal mood and affect. Normal behavior. Normal judgment and thought content.  NST: no 15X15 accels, no decels, moderate variability noted at start of strip.  FHR between: 130-150, Cat. 2 tracing. No contractions on toco.   Assessment and Plan:  Pregnancy: G2P1001 at [redacted]w[redacted]d  1. Supervision of other normal pregnancy, antepartum     Non-reactive NST.  4 prenatal visits this pregnancy including today.  Sent to MAU for evaluation of non-reactive NST.     2. Limited prenatal care in second trimester  - Fetal  nonstress test; Future  3. Limited prenatal care in third trimester  - Fetal nonstress test; Future  4. Mild tetrahydrocannabinol (THC) abuse   Term labor symptoms and general obstetric precautions including but not limited to vaginal bleeding, contractions, leaking of fluid and fetal movement were reviewed in detail with the patient. Please refer to After Visit Summary for other counseling recommendations.  Return in about 1 week (around 10/22/2016) for [place].   Morene Crocker, CNM

## 2016-10-15 NOTE — MAU Note (Signed)
Pt presents to MAU from physician's office for non reactive NST. Denies any pain

## 2016-10-15 NOTE — MAU Note (Signed)
Urine sent to lab 

## 2016-10-15 NOTE — Progress Notes (Signed)
Patient lost her mucus plug.

## 2016-10-15 NOTE — MAU Provider Note (Signed)
  History     CSN: [id number]  Arrival date and time: 10/15/16 1650   First Provider Initiated Contact with Patient 10/15/16 1721     G2P1001 @38 .1 weeks sent here for NRNST in office. She reports good FM. No VB, LOF, or ctx. Review of record shows limited [facility] and hx marijuana use.     OB History    Gravida Para Term Preterm AB Living   2 1 1     1    SAB TAB Ectopic Multiple Live Births                  Past Medical History:  Diagnosis Date  . No pertinent past medical history     Past Surgical History:  Procedure Laterality Date  . ACROMIO-CLAVICULAR JOINT REPAIR    . CLAVICLE SURGERY      Family History  Problem Relation Age of Onset  . Diabetes Father   . Hypertension Father   . Cancer Paternal Uncle   . Cancer Paternal Grandmother     Social History  Substance Use Topics  . Smoking status: Former Smoker    Packs/day: 0.25    Types: Cigarettes    Quit date: 02/04/2016  . Smokeless tobacco: Never Used  . Alcohol use No     Comment: socially previous to pregnancy    Allergies: No Known Allergies  Prescriptions Prior to Admission  Medication Sig Dispense Refill Last Dose  . Prenatal MV & Min w/FA-DHA (PRENATAL ADULT GUMMY/DHA/FA PO) Take 2 each by mouth daily.    Past Week at Unknown time    Review of Systems  Gastrointestinal: Negative.   Genitourinary: Negative.    Physical Exam   Blood pressure 105/71, pulse 93, temperature 97.5 F (36.4 C), temperature source Oral, resp. rate 16, last menstrual period 01/22/2016, unknown if currently breastfeeding.  Physical Exam  Nursing note and vitals reviewed. Constitutional: She is oriented to person, place, and time. She appears well-developed and well-nourished. No distress (appears comfortable).  HENT:  Head: Normocephalic and atraumatic.  Neck: Normal range of motion.  Cardiovascular: Normal rate.   Respiratory: Effort normal.  GI: Soft. She exhibits no distension. There is no tenderness.  gravid   Musculoskeletal: Normal range of motion.  Neurological: She is alert and oriented to person, place, and time.  Skin: Skin is warm and dry.  Psychiatric: She has a normal mood and affect.   EFM: 135 bpm, mod variability, + accels, no decels Toco: 2-3, mild  MAU Course  Procedures  MDM Reactive NST. BPP not indicated. Stable for discharge home.   Assessment and Plan   1. [redacted] weeks gestation of pregnancy   2. NST (non-stress test) reactive    Discharge home Follow up in office in 1 week as scheduled FMCs daily  Allergies as of 10/15/2016   No Known Allergies     Medication List    TAKE these medications   PRENATAL ADULT GUMMY/DHA/FA PO Take 2 each by mouth daily.      Julianne Handler, CNM 10/15/2016, 5:32 PM

## 2016-10-23 ENCOUNTER — Ambulatory Visit (INDEPENDENT_AMBULATORY_CARE_PROVIDER_SITE_OTHER): Payer: Medicaid Other | Admitting: Certified Nurse Midwife

## 2016-10-23 VITALS — BP 117/78 | HR 98 | Wt 169.4 lb

## 2016-10-23 DIAGNOSIS — Z3480 Encounter for supervision of other normal pregnancy, unspecified trimester: Secondary | ICD-10-CM

## 2016-10-23 DIAGNOSIS — O0933 Supervision of pregnancy with insufficient antenatal care, third trimester: Secondary | ICD-10-CM

## 2016-10-23 DIAGNOSIS — O99323 Drug use complicating pregnancy, third trimester: Secondary | ICD-10-CM

## 2016-10-23 DIAGNOSIS — F1210 Cannabis abuse, uncomplicated: Secondary | ICD-10-CM

## 2016-10-23 NOTE — Progress Notes (Signed)
Patient reports good fetal movement, denies pain/contractions.

## 2016-10-23 NOTE — Progress Notes (Signed)
   PRENATAL VISIT NOTE  Subjective:  Angela Cortez is a 26 y.o. G2P1001 at [redacted]w[redacted]d being seen today for ongoing prenatal care.  She is currently monitored for the following issues for this low-risk pregnancy and has Supervision of normal pregnancy, antepartum; Limited prenatal care in second trimester; Limited prenatal care in third trimester; and Mild tetrahydrocannabinol (THC) abuse on her problem list.  Patient reports no bleeding, no contractions, no cramping and no leaking.  Contractions: Not present. Vag. Bleeding: None.  Movement: Present. Denies leaking of fluid.   The following portions of the patient's history were reviewed and updated as appropriate: allergies, current medications, past family history, past medical history, past social history, past surgical history and problem list. Problem list updated.  Objective:   Vitals:   10/23/16 0954  BP: 117/78  Pulse: 98  Weight: 169 lb 6.4 oz (76.8 kg)    Fetal Status: Fetal Heart Rate (bpm): 135 Fundal Height: 39 cm Movement: Present  Presentation: Vertex  General:  Alert, oriented and cooperative. Patient is in no acute distress.  Skin: Skin is warm and dry. No rash noted.   Cardiovascular: Normal heart rate noted  Respiratory: Normal respiratory effort, no problems with respiration noted  Abdomen: Soft, gravid, appropriate for gestational age. Pain/Pressure: Absent     Pelvic:  Cervical exam deferred        Extremities: Normal range of motion.  Edema: Trace  Mental Status: Normal mood and affect. Normal behavior. Normal judgment and thought content.   Assessment and Plan:  Pregnancy: G2P1001 at [redacted]w[redacted]d  1. Supervision of other normal pregnancy, antepartum      Labs updated today.  Hx of induction with last pregnancy.   Limited prenatal care this pregnancy.  NST reactive last week.  Last [place] on 08/19/16: normal EFW: 38%  2. Mild tetrahydrocannabinol (THC) abuse      3. Limited prenatal care in third trimester     Lab  work updated.  Missed 2 hour OGTT.  - Hemoglobin A1c - RPR - Hepatitis C antibody - Hepatitis B surface antigen - HIV antibody  Term labor symptoms and general obstetric precautions including but not limited to vaginal bleeding, contractions, leaking of fluid and fetal movement were reviewed in detail with the patient. Please refer to After Visit Summary for other counseling recommendations.  Return in about 1 week (around 10/30/2016) for ROB, NST, IOL.   Morene Crocker, CNM

## 2016-10-24 LAB — HEMOGLOBIN A1C
Est. average glucose Bld gHb Est-mCnc: 97 mg/dL
Hgb A1c MFr Bld: 5 % (ref 4.8–5.6)

## 2016-10-24 LAB — HIV ANTIBODY (ROUTINE TESTING W REFLEX): HIV Screen 4th Generation wRfx: NONREACTIVE

## 2016-10-24 LAB — RPR: RPR Ser Ql: NONREACTIVE

## 2016-10-24 LAB — HEPATITIS C ANTIBODY

## 2016-10-24 LAB — HEPATITIS B SURFACE ANTIGEN: HEP B S AG: NEGATIVE

## 2016-10-25 IMAGING — US US MFM OB COMP +14 WKS
1 series · 14 of 28 positions shown · non-contrast
Comparison: none

[Series 1: us mfm ob comp +14 wks · 14 of 71 slices shown]
[im 3/71]
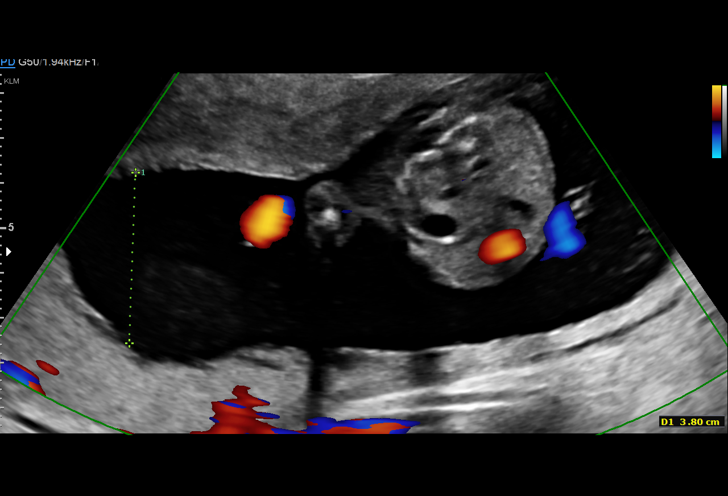
[im 8/71]
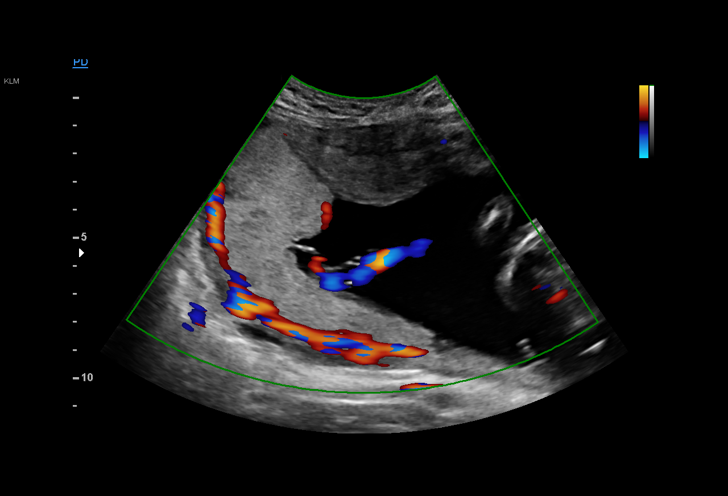
[im 13/71]
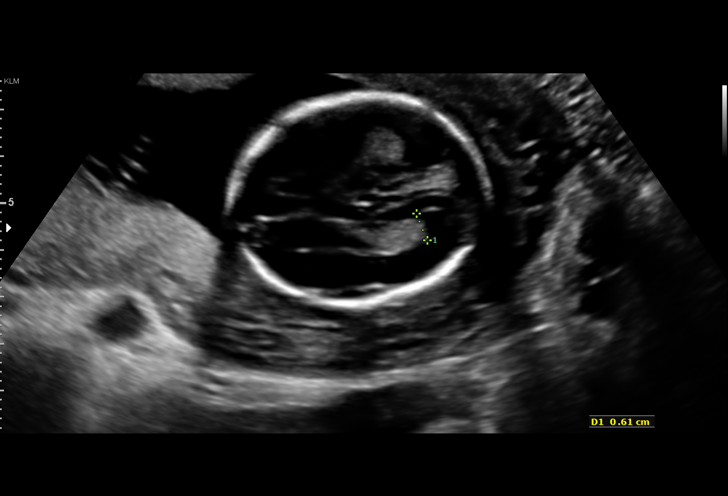
[im 19/71]
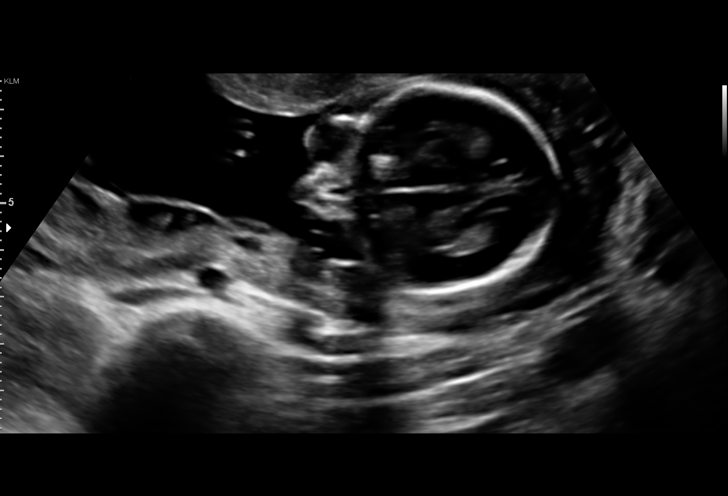
[im 24/71]
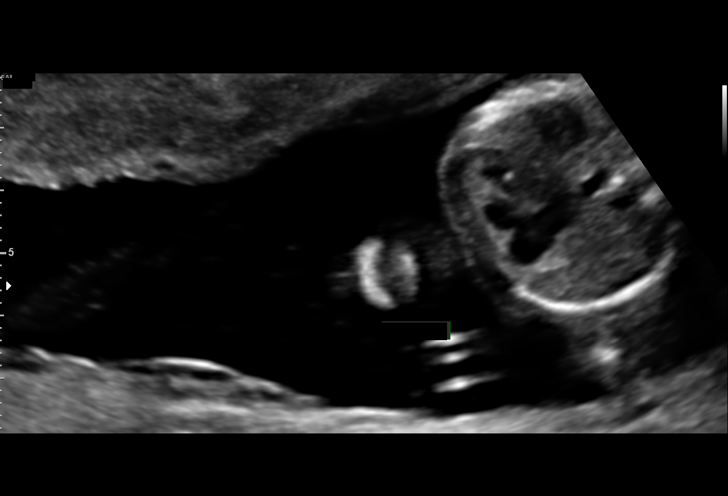
[im 29/71]
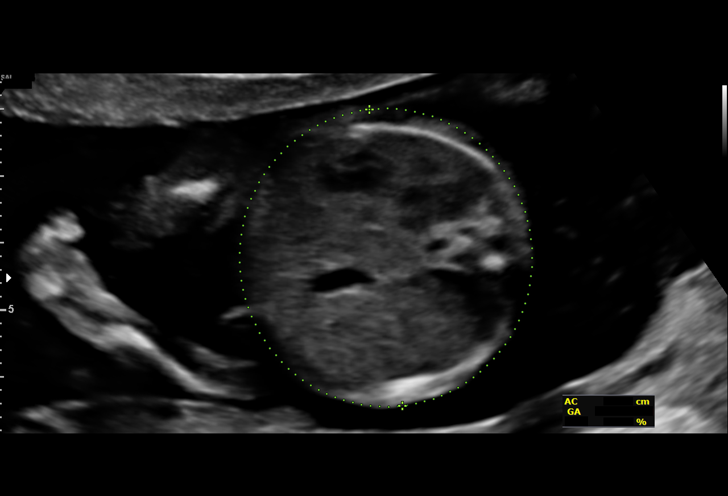
[im 34/71]
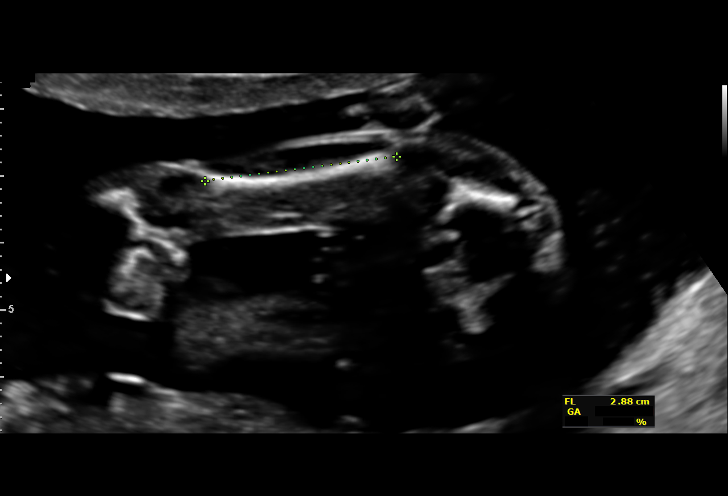
[im 39/71]
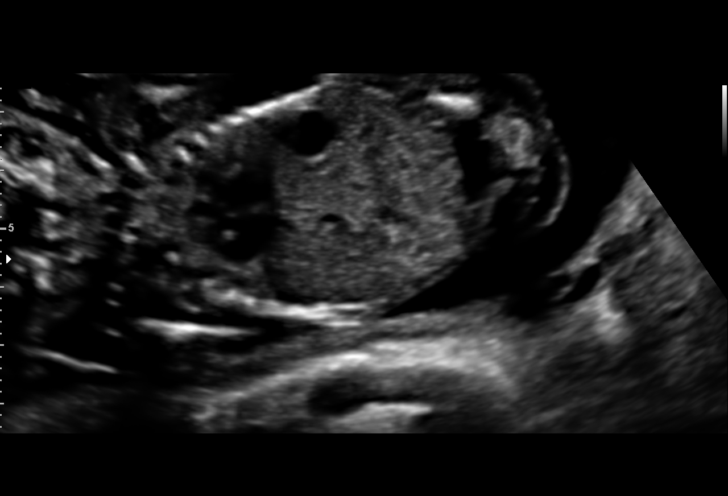
[im 45/71]
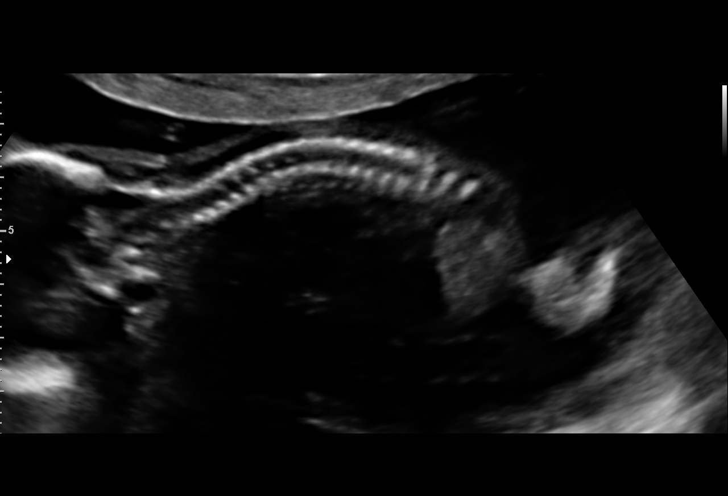
[im 50/71]
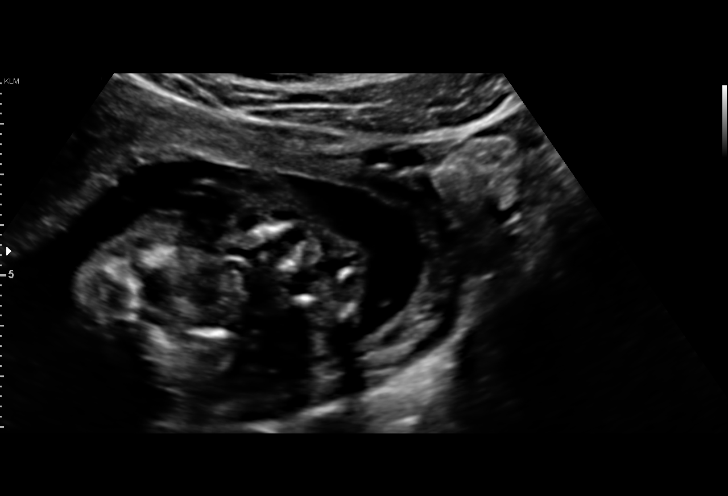
[im 55/71]
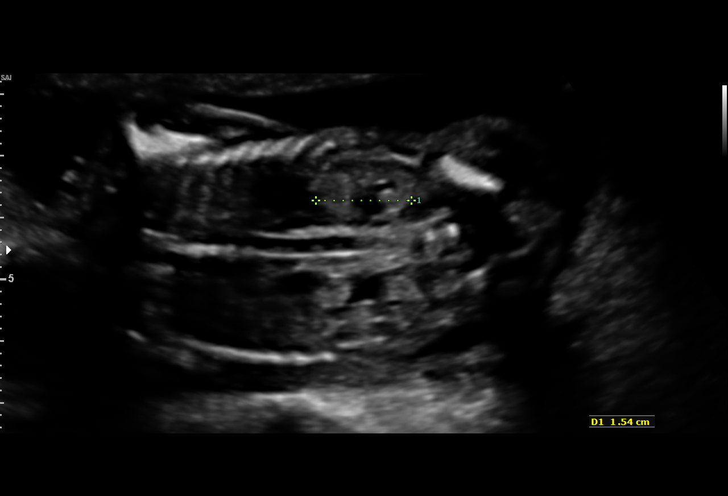
[im 60/71]
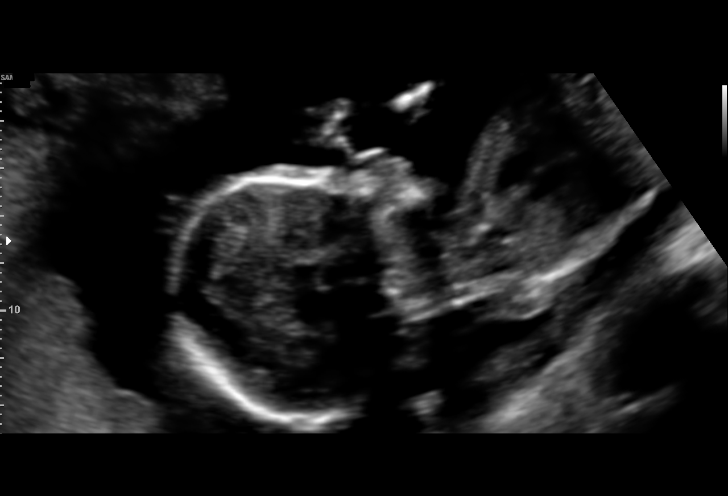
[im 65/71]
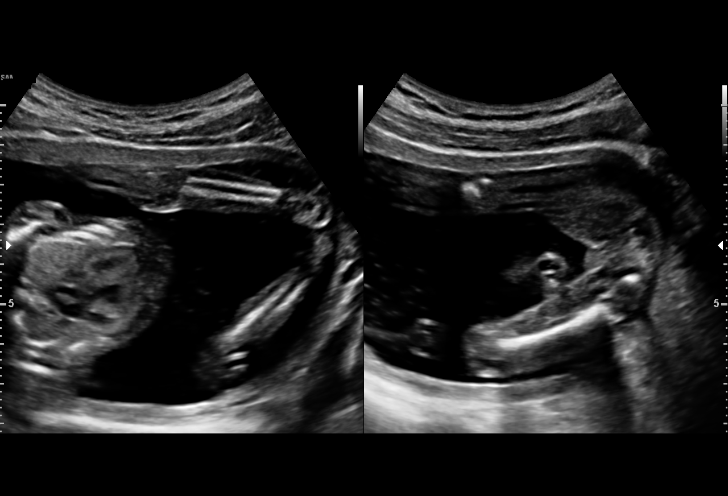
[im 71/71]
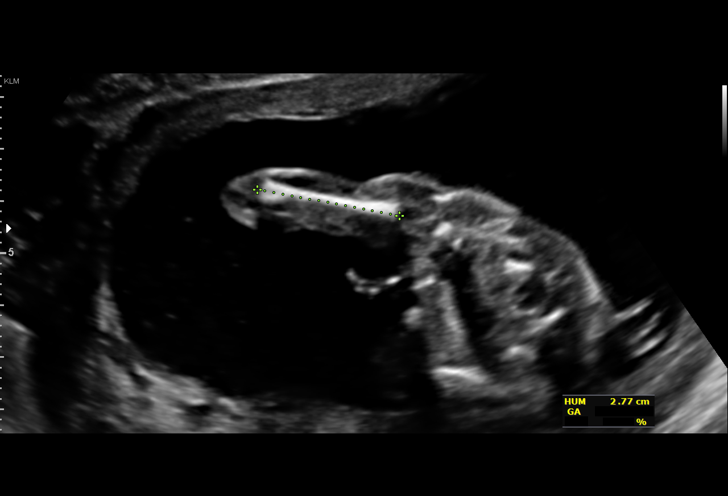

[14 of 28 positions shown; findings below may reference images not displayed]

1  NILU PATIN           [id number]      [id number]     [id number]
Indications

19 weeks gestation of pregnancy
Encounter for antenatal screening for
malformations
OB History

Blood Type:            Height:  5'4"   Weight (lb):  150      BMI:
Gravidity:    2         Term:   1        Prem:   0        SAB:   0
TOP:          0       Ectopic:  0        Living: 1
Fetal Evaluation

Num Of Fetuses:     1
Fetal Heart         145
Rate(bpm):
Cardiac Activity:   Observed
Presentation:       Variable
Placenta:           Posterior Fundal, above cervical os
P. Cord Insertion:  Visualized

Amniotic Fluid
AFI FV:      Subjectively within normal limits

Largest Pocket(cm)
3.8
Biometry

BPD:      44.1  mm     G. Age:  19w 2d         65  %    CI:        71.85   %   70 - 86
FL/HC:      17.4   %   16.1 -
HC:      165.6  mm     G. Age:  19w 2d         56  %    HC/AC:      1.18       1.09 -
AC:      140.6  mm     G. Age:  19w 3d         60  %    FL/BPD:     65.3   %
FL:       28.8  mm     G. Age:  18w 6d         38  %    FL/AC:      20.5   %   20 - 24
HUM:      27.7  mm     G. Age:  18w 6d         47  %

Est. FW:     278  gm    0 lb 10 oz      48  %
Gestational Age

LMP:           19w 0d       Date:   01/22/16                 EDD:   10/28/16
U/S Today:     19w 2d                                        EDD:   10/26/16
Best:          19w 0d    Det. By:   LMP  (01/22/16)          EDD:   10/28/16
Anatomy

Cranium:               Appears normal         Aortic Arch:            Appears normal
Cavum:                 Appears normal         Ductal Arch:            Not well visualized
Ventricles:            Appears normal         Diaphragm:              Appears normal
Choroid Plexus:        Appears normal         Stomach:                Appears normal, left
sided
Cerebellum:            Appears normal         Abdomen:                Appears normal
Posterior Fossa:       Appears normal         Abdominal Wall:         Appears nml (cord
insert, abd wall)
Nuchal Fold:           Appears normal         Cord Vessels:           Appears normal (3
vessel cord)
Face:                  Appears normal         Kidneys:                Appear normal
(orbits and profile)
Lips:                  Appears normal         Bladder:                Appears normal
Thoracic:              Appears normal         Spine:                  Appears normal
Heart:                 Echogenic focus        Upper Extremities:      Appears normal
in LV
RVOT:                  Not well visualized    Lower Extremities:      Appears normal
LVOT:                  Appears normal

Other:  Fetus appears to be a female. Heels and 5th digit visualized. Nasal
bone visualized.
Cervix Uterus Adnexa

Cervix
Length:            3.7  cm.
Normal appearance by transabdominal scan.
Impression

Singleton intrauterine pregnancy at 19 weeks
Review of the anatomy shows no sonographic markers for
aneuploidy or structural anomalies
However, evaluation of the cardiac anatomy should be
considered suboptimal secondary to early gestational age
and fetal position.
There is a nonpathologic echogenic intracardiac focus that is
part of the mitral valve complex
Amniotic fluid volume is normal
Estimated fetal weight is 278g which is growth in the 48th
percentile
Recommendations

Repeat exam in 4 weeks to complete cardiac anatomy survey
is recommended

## 2016-10-26 ENCOUNTER — Inpatient Hospital Stay (HOSPITAL_COMMUNITY)
Admission: AD | Admit: 2016-10-26 | Discharge: 2016-10-26 | Disposition: A | Discharge status: Home / Self Care | Payer: Medicaid Other | Source: Ambulatory Visit | Attending: Family Medicine | Admitting: Family Medicine

## 2016-10-26 ENCOUNTER — Inpatient Hospital Stay (HOSPITAL_COMMUNITY)
Admission: AD | Admit: 2016-10-26 | Discharge: 2016-10-29 | DRG: 775 | Disposition: A | Discharge status: Home / Self Care | Payer: Medicaid Other | Source: Ambulatory Visit | Attending: Family Medicine | Admitting: Family Medicine

## 2016-10-26 ENCOUNTER — Encounter (HOSPITAL_COMMUNITY): Payer: Self-pay | Admitting: Certified Nurse Midwife

## 2016-10-26 ENCOUNTER — Inpatient Hospital Stay (HOSPITAL_COMMUNITY): Payer: Medicaid Other

## 2016-10-26 ENCOUNTER — Encounter (HOSPITAL_COMMUNITY): Payer: Self-pay

## 2016-10-26 DIAGNOSIS — F1210 Cannabis abuse, uncomplicated: Secondary | ICD-10-CM | POA: Diagnosis present

## 2016-10-26 DIAGNOSIS — O0932 Supervision of pregnancy with insufficient antenatal care, second trimester: Secondary | ICD-10-CM

## 2016-10-26 DIAGNOSIS — O479 False labor, unspecified: Secondary | ICD-10-CM

## 2016-10-26 DIAGNOSIS — O99324 Drug use complicating childbirth: Secondary | ICD-10-CM | POA: Diagnosis present

## 2016-10-26 DIAGNOSIS — Z8249 Family history of ischemic heart disease and other diseases of the circulatory system: Secondary | ICD-10-CM

## 2016-10-26 DIAGNOSIS — Z3A39 39 weeks gestation of pregnancy: Secondary | ICD-10-CM

## 2016-10-26 DIAGNOSIS — Z3490 Encounter for supervision of normal pregnancy, unspecified, unspecified trimester: Secondary | ICD-10-CM

## 2016-10-26 DIAGNOSIS — Z3480 Encounter for supervision of other normal pregnancy, unspecified trimester: Secondary | ICD-10-CM

## 2016-10-26 DIAGNOSIS — O368390 Maternal care for abnormalities of the fetal heart rate or rhythm, unspecified trimester, not applicable or unspecified: Secondary | ICD-10-CM

## 2016-10-26 DIAGNOSIS — Z87891 Personal history of nicotine dependence: Secondary | ICD-10-CM

## 2016-10-26 LAB — CBC
HCT: 35.5 % — ABNORMAL LOW (ref 36.0–46.0)
HEMOGLOBIN: 12.1 g/dL (ref 12.0–15.0)
MCH: 31.2 pg (ref 26.0–34.0)
MCHC: 34.1 g/dL (ref 30.0–36.0)
MCV: 91.5 fL (ref 78.0–100.0)
Platelets: 229 10*3/uL (ref 150–400)
RBC: 3.88 MIL/uL (ref 3.87–5.11)
RDW: 13.5 % (ref 11.5–15.5)
WBC: 8.9 10*3/uL (ref 4.0–10.5)

## 2016-10-26 LAB — POCT FERN TEST
POCT FERN TEST: NEGATIVE
POCT Fern Test: NEGATIVE

## 2016-10-26 MED ORDER — LACTATED RINGERS IV SOLN
INTRAVENOUS | Status: DC
  Administered 2016-10-26 – 2016-10-27 (×4): via INTRAVENOUS

## 2016-10-26 NOTE — MAU Note (Signed)
Leaked while sleeping at 0350 . Went to the restroom and started having pain in the lower abdomen. Baby is active. No bleeding. 3 weeks ago was 1cm in MAU.

## 2016-10-26 NOTE — MAU Provider Note (Signed)
History     CSN: [id number]  Arrival date and time: 10/26/16 0425   First Provider Initiated Contact with Patient 10/26/16 [phone]      Chief Complaint  Patient presents with  . Rupture of Membranes   HPI  Angela Cortez is a 26 yo G2P1001 at 39w5 days who presents with concern for ruptured membranes.  She woke to go to the bathroom around 350 AM.  Her sleep shorts were damp.  She did not think it was urine based on smell so she came in for concern of LOF.  Contractions started around 4 AM as well.  These were moderate intensity, and quickly reduced in frequency/intensity until she was no longer having contractions after around 6 AM.  +FM, no VB.  OB History    Gravida Para Term Preterm AB Living   2 1 1     1    SAB TAB Ectopic Multiple Live Births                  Past Medical History:  Diagnosis Date  . No pertinent past medical history     Past Surgical History:  Procedure Laterality Date  . ACROMIO-CLAVICULAR JOINT REPAIR    . CLAVICLE SURGERY      Family History  Problem Relation Age of Onset  . Diabetes Father   . Hypertension Father   . Cancer Paternal Uncle   . Cancer Paternal Grandmother     Social History  Substance Use Topics  . Smoking status: Former Smoker    Packs/day: 0.25    Types: Cigarettes    Quit date: 02/04/2016  . Smokeless tobacco: Never Used  . Alcohol use No     Comment: socially previous to pregnancy    Allergies: No Known Allergies  Prescriptions Prior to Admission  Medication Sig Dispense Refill Last Dose  . Prenatal MV & Min w/FA-DHA (PRENATAL ADULT GUMMY/DHA/FA PO) Take 2 each by mouth daily.    10/25/2016 at Unknown time    Review of Systems  Constitutional: Negative for fever.  HENT: Positive for congestion and sore throat.   Eyes: Negative for visual disturbance.  Respiratory: Negative for shortness of breath.   Cardiovascular: Negative for chest pain and leg swelling.  Gastrointestinal: Positive for abdominal pain.  Negative for nausea.  Genitourinary: Negative for dysuria and vaginal bleeding.  Skin: Negative for rash.  Neurological: Negative for headaches.   Physical Exam   Blood pressure 116/78, pulse 98, temperature 97.6 F (36.4 C), temperature source Oral, resp. rate 18, last menstrual period 01/22/2016, SpO2 99 %, unknown if currently breastfeeding.  Physical Exam  Constitutional: She is oriented to person, place, and time. She appears well-developed and well-nourished. No distress.  HENT:  Head: Normocephalic.  Right Ear: External ear normal.  Left Ear: External ear normal.  Mouth/Throat: Oropharynx is clear and moist.  Eyes: Conjunctivae and EOM are normal.  Neck: Normal range of motion. Neck supple.  Cardiovascular: Normal rate and intact distal pulses.   Respiratory: Effort normal. No respiratory distress.  GI: Soft. There is no tenderness.  Genitourinary:  Genitourinary Comments: No pooling  Musculoskeletal: She exhibits no edema.  Neurological: She is alert and oriented to person, place, and time.  Skin: Skin is warm and dry.  Psychiatric: She has a normal mood and affect.   MAU Course  Procedures  MDM Maryann Alar and pooling negative for ruptured membranes SVE 1/60/-3 FHT:  Initially category II with baseline 130, mod variability, rare variable decels with accels.  After 1L NS bolus, contractions stopped, FHT improved to cat I with moderate variability and accels. BPP 8/8  Assessment and Plan  26 yo G2P1001 at [redacted]w[redacted]d presenting with concern for SROM. -not ruptured -not in labor -reassured by BPP 8/8; AFI 10.4cm, ceph -return precautions given, follow up with OB for routine prenatal care next week.  Lulu Riding 10/26/2016, 7:13 AM   I have participated in the care of this patient and I agree with the above. Serita Grammes CNM 9:40 AM 10/26/2016

## 2016-10-26 NOTE — H&P (Signed)
LABOR AND DELIVERY ADMISSION HISTORY AND PHYSICAL NOTE  Angela Cortez is a 26 y.o. female G2P1001 with IUP at [redacted]w[redacted]d by LMP and 6 week [place] presenting for spontaneous onset of labor.  She was evaluated earlier today for concern for SROM and contractions.  Pooling/ferning neg, ctx spaced out, and she was sent home after having a BPP of 8/8 (variable decels on fetal monitoring in the MAU).  Her ctx returned this morning and have gotten progressively stronger.  She reports positive fetal movement. She denies leakage of fluid or vaginal bleeding.  Prenatal History/Complications:  Past Medical History: Past Medical History:  Diagnosis Date  . No pertinent past medical history     Past Surgical History: Past Surgical History:  Procedure Laterality Date  . ACROMIO-CLAVICULAR JOINT REPAIR    . CLAVICLE SURGERY      Obstetrical History: OB History    Gravida Para Term Preterm AB Living   2 1 1     1    SAB TAB Ectopic Multiple Live Births                  Social History: Social History   Social History  . Marital status: Single    Spouse name: N/A  . Number of children: N/A  . Years of education: N/A   Social History Main Topics  . Smoking status: Former Smoker    Packs/day: 0.25    Types: Cigarettes    Quit date: 02/04/2016  . Smokeless tobacco: Never Used  . Alcohol use No     Comment: socially previous to pregnancy  . Drug use: No  . Sexual activity: Yes   Other Topics Concern  . None   Social History Narrative  . None    Family History: Family History  Problem Relation Age of Onset  . Diabetes Father   . Hypertension Father   . Cancer Paternal Uncle   . Cancer Paternal Grandmother     Allergies: No Known Allergies  Prescriptions Prior to Admission  Medication Sig Dispense Refill Last Dose  . Prenatal MV & Min w/FA-DHA (PRENATAL ADULT GUMMY/DHA/FA PO) Take 2 each by mouth daily.    10/25/2016 at Unknown time     Review of Systems   All systems  reviewed and negative except as stated in HPI  Blood pressure 114/78, pulse (!) 103, temperature 97.6 F (36.4 C), resp. rate 18, height 5\' 4"  (1.626 m), weight 170 lb (77.1 kg), last menstrual period 01/22/2016, unknown if currently breastfeeding. General appearance: alert, cooperative and appears stated age Lungs: no respiratory distress Heart: regular rate, normal peripheral pulses Abdomen: soft, non-tender; gravid Extremities: No calf swelling or tenderness Presentation: cephalic Fetal monitoring: category II - baseline 130, moderate variability (short periods of minimal), +accels, 1 minute variable to the 70s at 2241.  Reassured by moderate variability and presence of accels. Uterine activity: 3-5 min  Dilation: 4.5 Effacement (%): 60 Station: -3 Exam by:: Wende Bushy RN    Prenatal labs: ABO, Rh: O/Positive/-- (08/28 1604) Antibody: Negative (08/28 1604) Rubella: immune RPR: Non Reactive (03/14 1059)  HBsAg: Negative (03/14 1059)  HIV: Non Reactive (03/14 1059)  GBS:   negative 1 hr Glucola: never done; 3rd TM A1c 5.0% Genetic screening:  1st TM screen neg Anatomy US: 30 weeks, normal  Prenatal Transfer Tool  Maternal Diabetes: No Genetic Screening: Normal Maternal Ultrasounds/Referrals: Normal Fetal Ultrasounds or other Referrals:  None Maternal Substance Abuse:  Yes:  Type: Marijuana Significant Maternal Medications:  None  Significant Maternal Lab Results: Lab values include: Group B Strep negative  Results for orders placed or performed during the hospital encounter of 10/26/16 (from the past 24 hour(s))  CBC   Collection Time: 10/26/16 11:47 PM  Result Value Ref Range   WBC 8.9 4.0 - 10.5 K/uL   RBC 3.88 3.87 - 5.11 MIL/uL   Hemoglobin 12.1 12.0 - 15.0 g/dL   HCT 35.5 (L) 36.0 - 46.0 %   MCV 91.5 78.0 - 100.0 fL   MCH 31.2 26.0 - 34.0 pg   MCHC 34.1 30.0 - 36.0 g/dL   RDW 13.5 11.5 - 15.5 %   Platelets 229 150 - 400 K/uL  Results for orders placed or  performed during the hospital encounter of 10/26/16 (from the past 24 hour(s))  Fern Test   Collection Time: 10/26/16  5:09 AM  Result Value Ref Range   POCT Fern Test Negative = intact amniotic membranes   Fern Test   Collection Time: 10/26/16  7:06 AM  Result Value Ref Range   POCT Fern Test Negative = intact amniotic membranes     Patient Active Problem List   Diagnosis Date Noted  . Limited prenatal care in third trimester 10/15/2016  . Mild tetrahydrocannabinol (THC) abuse 10/15/2016  . Limited prenatal care in second trimester 08/06/2016  . Supervision of normal pregnancy, antepartum 03/29/2016    Assessment: Angela Cortez is a 26 y.o. G2P1001 at [redacted]w[redacted]d here for spontaneous onset of labor.  #Labor: spontaneous; expectant management, anticipate SVD #Pain: Desires epidural #FWB: Category II, reassured by accels and mod variability #ID:  none #MOF: breast #MOC:undecided #Circ:  n/a  Lulu Riding 10/26/2016, 11:55 PM   OB FELLOW HISTORY AND PHYSICAL ATTESTATION  I have seen and examined this patient; I agree with above documentation in the resident's note.   Pt is a 26 y/o G2P1 At 21 and 6 in spontaneous labor. I was called to patient's bedside urgently for fetal brady cardia at 1250. She had just received her epidural at 1228 and upon laying back in bed fer water broke. When I arrived nursing was placing FSE. Patient given a dose of phenylepherine with pressure of 105/70's and a dose of terbutaline. She was placed on hands and knees position.   Fetal heart rate improved and patient returned to her back and placed on her side.  Gen: well appearing NAD Pulm: no respiratory distress CV: RR no m/r/g abd: sustained contraction, improving before terbuataline given Cervix: 6/8/-2  A/P Expectant management at this time. Expect rapid progress after SROM. Continue to monitor.    [name] MD 10/27/2016, 1:06 AM

## 2016-10-26 NOTE — Discharge Instructions (Signed)

## 2016-10-26 NOTE — MAU Note (Signed)
Was seen MAU earlier this am and was 1.57cm. Ctxs stronger since going home. Some mucousy d/c.

## 2016-10-27 ENCOUNTER — Inpatient Hospital Stay (HOSPITAL_COMMUNITY): Payer: Medicaid Other | Admitting: Anesthesiology

## 2016-10-27 ENCOUNTER — Encounter (HOSPITAL_COMMUNITY): Payer: Self-pay

## 2016-10-27 DIAGNOSIS — F1210 Cannabis abuse, uncomplicated: Secondary | ICD-10-CM | POA: Diagnosis present

## 2016-10-27 DIAGNOSIS — Z3493 Encounter for supervision of normal pregnancy, unspecified, third trimester: Secondary | ICD-10-CM | POA: Diagnosis present

## 2016-10-27 DIAGNOSIS — Z3A39 39 weeks gestation of pregnancy: Secondary | ICD-10-CM

## 2016-10-27 DIAGNOSIS — O99324 Drug use complicating childbirth: Secondary | ICD-10-CM | POA: Diagnosis present

## 2016-10-27 DIAGNOSIS — Z8249 Family history of ischemic heart disease and other diseases of the circulatory system: Secondary | ICD-10-CM | POA: Diagnosis not present

## 2016-10-27 DIAGNOSIS — Z87891 Personal history of nicotine dependence: Secondary | ICD-10-CM | POA: Diagnosis not present

## 2016-10-27 LAB — ABO/RH: ABO/RH(D): O POS

## 2016-10-27 LAB — TYPE AND SCREEN
ABO/RH(D): O POS
Antibody Screen: NEGATIVE

## 2016-10-27 LAB — RPR: RPR: NONREACTIVE

## 2016-10-27 MED ORDER — SIMETHICONE 80 MG PO CHEW: 80.0000 mg | CHEWABLE_TABLET | ORAL | Status: DC | PRN

## 2016-10-27 MED ORDER — SODIUM CHLORIDE 0.9% FLUSH: 3.0000 mL | INTRAVENOUS | Status: DC | PRN

## 2016-10-27 MED ORDER — IBUPROFEN 600 MG PO TABS
600.0000 mg | ORAL_TABLET | Freq: Four times a day (QID) | ORAL | Status: DC
  Administered 2016-10-27 – 2016-10-29 (×8): 600 mg via ORAL
  Filled 2016-10-27 (×8): qty 1

## 2016-10-27 MED ORDER — ACETAMINOPHEN 325 MG PO TABS: 650.0000 mg | ORAL_TABLET | ORAL | Status: DC | PRN

## 2016-10-27 MED ORDER — PHENYLEPHRINE 40 MCG/ML (10ML) SYRINGE FOR IV PUSH (FOR BLOOD PRESSURE SUPPORT)
PREFILLED_SYRINGE | INTRAVENOUS | Status: AC
  Filled 2016-10-27: qty 10

## 2016-10-27 MED ORDER — FENTANYL 2.5 MCG/ML BUPIVACAINE 1/10 % EPIDURAL INFUSION (WH - ANES): 14.0000 mL/h | INTRAMUSCULAR | Status: DC | PRN

## 2016-10-27 MED ORDER — DIPHENHYDRAMINE HCL 50 MG/ML IJ SOLN: 12.5000 mg | Freq: Once | INTRAMUSCULAR | Status: DC

## 2016-10-27 MED ORDER — EPHEDRINE 5 MG/ML INJ: 10.0000 mg | INTRAVENOUS | Status: DC | PRN

## 2016-10-27 MED ORDER — WITCH HAZEL-GLYCERIN EX PADS: 1.0000 "application " | MEDICATED_PAD | CUTANEOUS | Status: DC | PRN

## 2016-10-27 MED ORDER — FENTANYL 2.5 MCG/ML BUPIVACAINE 1/10 % EPIDURAL INFUSION (WH - ANES)
14.0000 mL/h | INTRAMUSCULAR | Status: DC | PRN
  Administered 2016-10-27 (×2): 14 mL/h via EPIDURAL
  Filled 2016-10-27: qty 100

## 2016-10-27 MED ORDER — OXYTOCIN BOLUS FROM INFUSION: 500.0000 mL | Freq: Once | INTRAVENOUS | Status: DC

## 2016-10-27 MED ORDER — PHENYLEPHRINE 40 MCG/ML (10ML) SYRINGE FOR IV PUSH (FOR BLOOD PRESSURE SUPPORT): 80.0000 ug | PREFILLED_SYRINGE | INTRAVENOUS | Status: DC | PRN

## 2016-10-27 MED ORDER — OXYTOCIN 40 UNITS IN LACTATED RINGERS INFUSION - SIMPLE MED
1.0000 m[IU]/min | INTRAVENOUS | Status: DC
Start: 2016-10-27 — End: 2016-10-27
  Administered 2016-10-27: 2 m[IU]/min via INTRAVENOUS

## 2016-10-27 MED ORDER — TETANUS-DIPHTH-ACELL PERTUSSIS 5-2.5-18.5 LF-MCG/0.5 IM SUSP: 0.5000 mL | Freq: Once | INTRAMUSCULAR | Status: DC

## 2016-10-27 MED ORDER — ZOLPIDEM TARTRATE 5 MG PO TABS: 5.0000 mg | ORAL_TABLET | Freq: Every evening | ORAL | Status: DC | PRN

## 2016-10-27 MED ORDER — LACTATED RINGERS IV SOLN: 500.0000 mL | Freq: Once | INTRAVENOUS | Status: DC

## 2016-10-27 MED ORDER — PHENYLEPHRINE 40 MCG/ML (10ML) SYRINGE FOR IV PUSH (FOR BLOOD PRESSURE SUPPORT)
80.0000 ug | PREFILLED_SYRINGE | INTRAVENOUS | Status: AC | PRN
  Administered 2016-10-27 (×3): 80 ug via INTRAVENOUS
  Filled 2016-10-27: qty 10

## 2016-10-27 MED ORDER — LIDOCAINE HCL (PF) 1 % IJ SOLN
30.0000 mL | INTRAMUSCULAR | Status: DC | PRN
  Filled 2016-10-27: qty 30

## 2016-10-27 MED ORDER — SODIUM CHLORIDE 0.9 % IV SOLN: 25.0000 mg | INTRAVENOUS | Status: DC | PRN

## 2016-10-27 MED ORDER — DIPHENHYDRAMINE HCL 50 MG/ML IJ SOLN
INTRAMUSCULAR | Status: DC
Start: 2016-10-27 — End: 2016-10-27
  Filled 2016-10-27: qty 1

## 2016-10-27 MED ORDER — LIDOCAINE HCL (PF) 1 % IJ SOLN
INTRAMUSCULAR | Status: DC | PRN
  Administered 2016-10-27: 13 mL via EPIDURAL

## 2016-10-27 MED ORDER — SODIUM CHLORIDE 0.9% FLUSH: 3.0000 mL | Freq: Two times a day (BID) | INTRAVENOUS | Status: DC

## 2016-10-27 MED ORDER — ONDANSETRON HCL 4 MG/2ML IJ SOLN
4.0000 mg | Freq: Four times a day (QID) | INTRAMUSCULAR | Status: DC | PRN
  Administered 2016-10-27: 4 mg via INTRAVENOUS
  Filled 2016-10-27: qty 2

## 2016-10-27 MED ORDER — COCONUT OIL OIL: 1.0000 "application " | TOPICAL_OIL | Status: DC | PRN

## 2016-10-27 MED ORDER — OXYCODONE-ACETAMINOPHEN 5-325 MG PO TABS: 1.0000 | ORAL_TABLET | ORAL | Status: DC | PRN

## 2016-10-27 MED ORDER — DIPHENHYDRAMINE HCL 25 MG PO CAPS: 25.0000 mg | ORAL_CAPSULE | Freq: Four times a day (QID) | ORAL | Status: DC | PRN

## 2016-10-27 MED ORDER — SODIUM CHLORIDE 0.9 % IV SOLN: 250.0000 mL | INTRAVENOUS | Status: DC | PRN

## 2016-10-27 MED ORDER — EPHEDRINE 5 MG/ML INJ
10.0000 mg | INTRAVENOUS | Status: DC | PRN
  Filled 2016-10-27: qty 2

## 2016-10-27 MED ORDER — DIBUCAINE 1 % RE OINT: 1.0000 "application " | TOPICAL_OINTMENT | RECTAL | Status: DC | PRN

## 2016-10-27 MED ORDER — OXYCODONE-ACETAMINOPHEN 5-325 MG PO TABS: 2.0000 | ORAL_TABLET | ORAL | Status: DC | PRN

## 2016-10-27 MED ORDER — TERBUTALINE SULFATE 1 MG/ML IJ SOLN
INTRAMUSCULAR | Status: AC
  Administered 2016-10-27: 0.25 mg
  Filled 2016-10-27: qty 1

## 2016-10-27 MED ORDER — MEASLES, MUMPS & RUBELLA VAC ~~LOC~~ INJ: 0.5000 mL | INJECTION | Freq: Once | SUBCUTANEOUS | Status: DC

## 2016-10-27 MED ORDER — TERBUTALINE SULFATE 1 MG/ML IJ SOLN
0.2500 mg | Freq: Once | INTRAMUSCULAR | Status: DC | PRN
  Filled 2016-10-27: qty 1

## 2016-10-27 MED ORDER — BENZOCAINE-MENTHOL 20-0.5 % EX AERO: 1.0000 "application " | INHALATION_SPRAY | CUTANEOUS | Status: DC | PRN

## 2016-10-27 MED ORDER — OXYTOCIN 40 UNITS IN LACTATED RINGERS INFUSION - SIMPLE MED
1.0000 m[IU]/min | INTRAVENOUS | Status: DC
  Filled 2016-10-27: qty 1000

## 2016-10-27 MED ORDER — ONDANSETRON HCL 4 MG PO TABS: 4.0000 mg | ORAL_TABLET | ORAL | Status: DC | PRN

## 2016-10-27 MED ORDER — DIPHENHYDRAMINE HCL 50 MG/ML IJ SOLN: 12.5000 mg | INTRAMUSCULAR | Status: DC | PRN

## 2016-10-27 MED ORDER — PHENYLEPHRINE 40 MCG/ML (10ML) SYRINGE FOR IV PUSH (FOR BLOOD PRESSURE SUPPORT)
80.0000 ug | PREFILLED_SYRINGE | INTRAVENOUS | Status: AC | PRN
  Administered 2016-10-27 (×3): 80 ug via INTRAVENOUS

## 2016-10-27 MED ORDER — LACTATED RINGERS IV SOLN
500.0000 mL | INTRAVENOUS | Status: DC | PRN
  Administered 2016-10-27 (×2): 500 mL via INTRAVENOUS
  Administered 2016-10-27: 1000 mL via INTRAVENOUS
  Administered 2016-10-27: 500 mL via INTRAVENOUS

## 2016-10-27 MED ORDER — SOD CITRATE-CITRIC ACID 500-334 MG/5ML PO SOLN
30.0000 mL | ORAL | Status: DC | PRN
  Filled 2016-10-27 (×2): qty 15

## 2016-10-27 MED ORDER — ONDANSETRON HCL 4 MG/2ML IJ SOLN: 4.0000 mg | INTRAMUSCULAR | Status: DC | PRN

## 2016-10-27 MED ORDER — PRENATAL MULTIVITAMIN CH
1.0000 | ORAL_TABLET | Freq: Every day | ORAL | Status: DC
  Administered 2016-10-28 – 2016-10-29 (×2): 1 via ORAL
  Filled 2016-10-27 (×2): qty 1

## 2016-10-27 MED ORDER — EPHEDRINE 5 MG/ML INJ
10.0000 mg | INTRAVENOUS | Status: DC | PRN
  Filled 2016-10-27: qty 4
  Filled 2016-10-27: qty 2

## 2016-10-27 MED ORDER — FENTANYL 2.5 MCG/ML BUPIVACAINE 1/10 % EPIDURAL INFUSION (WH - ANES)
INTRAMUSCULAR | Status: AC
  Filled 2016-10-27: qty 100

## 2016-10-27 MED ORDER — LACTATED RINGERS IV SOLN
INTRAVENOUS | Status: DC
  Administered 2016-10-27 (×2): via INTRAUTERINE

## 2016-10-27 MED ORDER — TERBUTALINE SULFATE 1 MG/ML IJ SOLN
0.2500 mg | Freq: Once | INTRAMUSCULAR | Status: AC | PRN
  Administered 2016-10-27: 0.25 mg via SUBCUTANEOUS
  Filled 2016-10-27: qty 1

## 2016-10-27 MED ORDER — SENNOSIDES-DOCUSATE SODIUM 8.6-50 MG PO TABS
2.0000 | ORAL_TABLET | ORAL | Status: DC
  Administered 2016-10-28 – 2016-10-29 (×2): 2 via ORAL
  Filled 2016-10-27 (×2): qty 2

## 2016-10-27 MED ORDER — OXYCODONE-ACETAMINOPHEN 5-325 MG PO TABS
1.0000 | ORAL_TABLET | Freq: Four times a day (QID) | ORAL | Status: DC | PRN
Start: 2016-10-27 — End: 2016-10-29
  Administered 2016-10-27 – 2016-10-28 (×2): 2 via ORAL
  Filled 2016-10-27 (×3): qty 2

## 2016-10-27 MED ORDER — FENTANYL CITRATE (PF) 100 MCG/2ML IJ SOLN: 100.0000 ug | INTRAMUSCULAR | Status: DC | PRN

## 2016-10-27 MED ORDER — OXYTOCIN 40 UNITS IN LACTATED RINGERS INFUSION - SIMPLE MED
2.5000 [IU]/h | INTRAVENOUS | Status: DC
  Filled 2016-10-27: qty 1000

## 2016-10-27 NOTE — Progress Notes (Signed)
LABOR PROGRESS NOTE  Angela Cortez is a 26 y.o. G2P1001 at [redacted]w[redacted]d  admitted for SOL  Subjective: Patient has been evaluated throughout the night for decelerations after admission. She has SROM shortly after epidrual placement, followed by prolonged deceleration. This improved with terbutaline and phenylepherine. She has continue to have regular decelerations which were initially late in nature, but have become early. IUPC was placed and patient is now adequate. She has amnio-infusion going at this time.   Objective: BP (!) 104/49   Pulse 98   Temp 98.4 F (36.9 C) (Oral)   Resp 18   Ht 5\' 4"  (1.626 m)   Wt 170 lb (77.1 kg)   LMP 01/22/2016 (Exact Date)   BMI 29.18 kg/m  or  Vitals:   10/27/16 0401 10/27/16 0431 10/27/16 0501 10/27/16 0532  BP: (!) 97/56 (!) 97/46 (!) 94/49 (!) 104/49  Pulse: 97 96 95 98  Resp:      Temp:      TempSrc:      Weight:      Height:         Dilation: 6 Effacement (%): 90 Cervical Position: Middle Station: -2 Presentation: Vertex Exam by:: Erskine Squibb MD  Labs: Lab Results  Component Value Date   WBC 8.9 10/26/2016   HGB 12.1 10/26/2016   HCT 35.5 (L) 10/26/2016   MCV 91.5 10/26/2016   PLT 229 10/26/2016    Patient Active Problem List   Diagnosis Date Noted  . Limited prenatal care in third trimester 10/15/2016  . Mild tetrahydrocannabinol (THC) abuse 10/15/2016  . Limited prenatal care in second trimester 08/06/2016  . Supervision of normal pregnancy, antepartum 03/29/2016    Assessment / Plan: 26 y.o. G2P1001 at [redacted]w[redacted]d here for SOL  Labor:  Attempted to augment patient with pitocin, with decelerations following initiation continue expectant management. Fetal Wellbeing:  Category 2 Pain Control:   epidrual Anticipated MOD:  Vaginal, with low threshold for surgical delivery if strip deteriorates.   [name], MD 10/27/2016, 5:54 AM

## 2016-10-27 NOTE — Progress Notes (Signed)
Angela Cortez is a 26 y.o. G2P1001 at [redacted]w[redacted]d by ultrasound admitted for active labor  Subjective:   Objective: BP 95/60   Pulse 95   Temp 98.3 F (36.8 C) (Oral)   Resp 18   Ht 5\' 4"  (1.626 m)   Wt 170 lb (77.1 kg)   LMP 01/22/2016 (Exact Date)   BMI 29.18 kg/m  I/O last 3 completed shifts: In: -  Out: 650 [Urine:500; Emesis/NG output:150] No intake/output data recorded.  FHT:  FHR: 130's-140 bpm, variability: moderate,  accelerations:  Present,  decelerations:  Absent UC:   regular, every 2 minutes SVE:   Dilation: 7 Effacement (%): 90 Station: -2 Exam by:: Angela Byars, RN  Labs: Lab Results  Component Value Date   WBC 8.9 10/26/2016   HGB 12.1 10/26/2016   HCT 35.5 (L) 10/26/2016   MCV 91.5 10/26/2016   PLT 229 10/26/2016    Assessment / Plan: Augmentation of labor, progressing well  Labor: slow progress, progress report to Angela Cortez Preeclampsia:  no signs or symptoms of toxicity and intake and ouput balanced Fetal Wellbeing:  Category I Pain Control:  Epidural I/D:  n/a Anticipated MOD:  NSVD  Angela Cortez 10/27/2016, 1:14 PM

## 2016-10-27 NOTE — Progress Notes (Signed)
LABOR PROGRESS NOTE  Subjective: Not feeling contractions, able to sleep through them.  Objective: BP (!) 91/43 (BP Location: Left Arm)   Pulse 100   Temp 98.4 F (36.9 C) (Oral)   Resp 18   Ht 5\' 4"  (1.626 m)   Wt 170 lb (77.1 kg)   LMP 01/22/2016 (Exact Date)   BMI 29.18 kg/m    Dilation: 6 Effacement (%): 70 Cervical Position: Middle Station: -2 Presentation: Vertex Exam by:: Johnnye Sima MD  FHT:  5643 late decel.  Pitocin started at 0335 @ 2U.  Had variable to 90 and a late to 49.  Pitocin stopped, patient repositioned, and bolus running. Baseline 150, moderate variability, no accels, having recurrent decels - sometimes variable, sometimes late and rarely early  Assessment / Plan: 26 y.o. G2P1001 at [redacted]w[redacted]d here for spontaneous onset of labor  Labor: spontaneous Fetal Wellbeing:  Category II, reassured by moderate variability, but not reassured by lack of accels & recurrent decels.  Attempting amninfusion Pain Control:  epidural Anticipated MOD:  Will need pLTCS if continues fetal intolerance of labor.  Discussed with Dr. Baron Sane.  Lulu Riding, MD 10/27/2016, 4:03 AM

## 2016-10-27 NOTE — Progress Notes (Signed)
Angela Cortez is a 26 y.o. G2P1001 at [redacted]w[redacted]d by ultrasound admitted for active labor  Subjective:   Objective: BP 114/69   Pulse (!) 103   Temp 98.5 F (36.9 C) (Oral)   Resp 18   Ht 5\' 4"  (1.626 m)   Wt 170 lb (77.1 kg)   LMP 01/22/2016 (Exact Date)   BMI 29.18 kg/m  I/O last 3 completed shifts: In: -  Out: 650 [Urine:500; Emesis/NG output:150] No intake/output data recorded.  FHT:  FHR: 125-130 bpm, variability: moderate,  accelerations:  Abscent,  decelerations:  Absent UC:   regular, every 2-3 minutes SVE:   Dilation: 7 Effacement (%): 90 Station: -2 Exam by:: Dr. Baron Sane  Labs: Lab Results  Component Value Date   WBC 8.9 10/26/2016   HGB 12.1 10/26/2016   HCT 35.5 (L) 10/26/2016   MCV 91.5 10/26/2016   PLT 229 10/26/2016    Assessment / Plan: Augmentation of labor, progressing well  Labor: Progressing normally Preeclampsia:  no signs or symptoms of toxicity and intake and ouput balanced Fetal Wellbeing:  Category I Pain Control:  Epidural I/D:  n/a Anticipated MOD:  NSVD  Angela Cortez 10/27/2016, 9:22 AM

## 2016-10-27 NOTE — Anesthesia Pain Management Evaluation Note (Signed)
  CRNA Pain Management Visit Note  Patient: Angela Cortez, 26 y.o., female  "Hello I am a member of the anesthesia team at [facility]. We have an anesthesia team available at all times to provide care throughout the hospital, including epidural management and anesthesia for C-section. I don't know your plan for the delivery whether it a natural birth, water birth, IV sedation, nitrous supplementation, doula or epidural, but we want to meet your pain goals."   1.Was your pain managed to your expectations on prior hospitalizations?   Yes   2.What is your expectation for pain management during this hospitalization?     Epidural  3.How can we help you reach that goal? epidural  Record the patient's initial score and the patient's pain goal.   Pain: 0  Pain Goal: 4 [facility] wants you to be able to say your pain was always managed very well.  Willa Rough 10/27/2016

## 2016-10-27 NOTE — Anesthesia Procedure Notes (Signed)
Epidural Patient location during procedure: OB Start time: 10/27/2016 12:18 AM End time: 10/27/2016 12:38 AM  Staffing Anesthesiologist: Candida Peeling RAY Performed: anesthesiologist   Preanesthetic Checklist Completed: patient identified, site marked, surgical consent, pre-op evaluation, timeout performed, IV checked, risks and benefits discussed and monitors and equipment checked  Epidural Patient position: sitting Prep: DuraPrep Patient monitoring: heart rate, cardiac monitor, continuous pulse ox and blood pressure Approach: midline Location: L2-L3 Injection technique: LOR saline  Needle:  Needle type: Tuohy  Needle gauge: 17 G Needle length: 9 cm Needle insertion depth: 5 cm Catheter type: closed end flexible Catheter size: 20 Guage Catheter at skin depth: 8 cm Test dose: negative  Assessment Events: blood not aspirated, injection not painful, no injection resistance, negative IV test and no paresthesia  Additional Notes Reason for block:procedure for pain

## 2016-10-27 NOTE — Anesthesia Preprocedure Evaluation (Signed)

## 2016-10-28 NOTE — Lactation Note (Signed)
This note was copied from a baby's chart. Lactation Consultation Note Mom's 2nd baby. Didn't BF her now 26 yr old. Mom plans to Bf/formula bottle feed. Mom has pendulum breast w/everted nipples. Mom demonstrated hand expression w/easy flow of colostrum. Encouraged mom to BF only for first 2 week, then if she desired to BF in ocean.  Baby on DPT. Has been spitty. Encouraged and stressed importance of documenting I&O. Discussed hand expression and spoon feeding after BF if baby still appears hungry. Encouraged to assess breast before and after BF for milk transfer.  Educated on newborn behavior, cluster feeding, supply and demand. Mom giving baby pacifier. Discussed preference of waiting until after 2 weeks to prevent nipple confusion. Baby being on DPT, and fussy, mom states baby will not let you put her in bed unless holding her and DPT. Mom encouraged to feed baby 8-12 times/24 hours and with feeding cues.  [place] brochure given w/resources, support groups and [place] services. Patient Name: Angela Cortez Date: 10/28/2016 Reason for consult: Initial assessment   Maternal Data Has patient been taught Hand Expression?: Yes Does the patient have breastfeeding experience prior to this delivery?: No  Feeding Feeding Type: Breast Fed Length of feed: 50 min  LATCH Score/Interventions       Type of Nipple: Everted at rest and after stimulation  Comfort (Breast/Nipple): Soft / non-tender     Hold (Positioning): No assistance needed to correctly position infant at breast. Intervention(s): Breastfeeding basics reviewed;Support Pillows;Position options;Skin to skin     Lactation Tools Discussed/Used     Consult Status Consult Status: Follow-up Date: 10/29/16 Follow-up type: In-patient    Theodoro Kalata 10/28/2016, 4:39 AM

## 2016-10-28 NOTE — Anesthesia Postprocedure Evaluation (Signed)
Anesthesia Post Note  Patient: Angela Cortez  Procedure(s) Performed: * No procedures listed *  Patient location during evaluation: Mother Baby Anesthesia Type: Epidural Level of consciousness: awake, awake and alert, oriented and patient cooperative Pain management: pain level controlled Vital Signs Assessment: post-procedure vital signs reviewed and stable Respiratory status: spontaneous breathing, nonlabored ventilation and respiratory function stable Cardiovascular status: stable Postop Assessment: no headache, no backache, patient able to bend at knees and no signs of nausea or vomiting Anesthetic complications: no        Last Vitals:  Vitals:   10/27/16 2200 10/28/16 0525  BP: 115/63 (!) 100/47  Pulse: 91 77  Resp: 20 18  Temp: 36.6 C 36.6 C    Last Pain:  Vitals:   10/28/16 0630  TempSrc:   PainSc: Asleep   Pain Goal: Patients Stated Pain Goal: 3 (10/27/16 2030)               Max Nuno L

## 2016-10-28 NOTE — Clinical Social Work Maternal (Addendum)
CLINICAL SOCIAL WORK MATERNAL/CHILD NOTE  Patient Details  Name: Angela Cortez MRN: [id number] Date of Birth: [birth year]  Date:  10/28/2016  Clinical Social Worker Initiating Note:  Laurey Arrow Date/ Time Initiated:  10/28/16/1303     Child's Name:  Angela Cortez   Legal Guardian:  Mother (FOB is Angela Cortez 05/17/1992)   Need for Interpreter:  None   Date of Referral:  10/28/16     Reason for Referral:  Current Substance Use/Substance Use During Pregnancy  (Hx of marijuana use during pregnancy. )   Referral Source:  Central Nursery   Address:  [address] [place] [address]  Phone number:  [phone]   Household Members:  Self, Minor Children, Parents, Siblings   Natural Supports (not living in the home):  Spouse/significant other   Professional Supports: None   Employment: Unemployed   Type of Work:     Education:  Database administrator Resources:  [employer]   Other Resources:  [employer], Physicist, medical    Cultural/Religious Considerations Which May Impact Care:  MOB is Non-Denominational  Strengths:  Ability to meet basic needs , Engineer, materials , Home prepared for child    Risk Factors/Current Problems:  Substance Use    Cognitive State:  Alert , Able to Concentrate , Insightful , Linear Thinking    Mood/Affect:  Calm , Bright , Happy , Comfortable , Interested    CSW Assessment: CSW met with MOB to complete an assessment for a consult for substance abuse hx. When CSW arrived, MOB was in bed and infant was asleep in the bassinet. MOB was polite and receptive to meeting with CSW. CSW inquired about MOB's substance abuse hx, and MOB acknowledged the use of marijuana during pregnancy. MOB reported that MOB smoked marijuana in to assist MOB with decreasing daily vomiting.  MOB disclosed MOB's last use was the beginning of February 2018.   CSW made MOB aware of the two screenings for the infant. MOB was  understanding and again admitted to utilizing marijuana during pregnancy.  CSW thanked MOB for being honest, and informed MOB that the infant's UDS was negative and CSW will monitor infant's CDS. CSW made MOB that if infant CDS is positive without an explanation, CSW will make a report to [facility]; MOB denied a hx of CPS involvement.  MOB was understanding and did not have any questions or concerns. CSW offered resources and referrals for SA treatment and MOB declined.    CSW also inquired about MOB's limited PNC. MOB reported that MOB did not have reliable transportation and could not depend on others to transport MOB to scheduled appointments.  MOB denied any current transportation barriers and reported that MOB has a car now.  MOB also reported MOB feels prepared for infant and has all necessary items.   CSW educated MOB about PPD and informed MOB of possible supports and interventions to decrease PPD.  CSW also encouraged MOB to seek medical attention if needed for increased signs and symptoms for PPD.  MOB acknowledged PPD with MOB's oldest child and expressed that MOB attributed to infant's NICU admission. CSW reviewed safe sleep and SIDS and MOB asked and responded appropriately.  CSW thanked MOB for meeting CSW.  CSW provided MOB with CSW contact information and was encouraged to contact CSW if any questions or concerns arise.    CSW Plan/Description:  No Further Intervention Required/No Barriers to Discharge, Information/Referral to [employer] , Engineer, mining  (  CSW will monitor infant's CDS and will make a report if warranted. )   Angela Cortez, MSW, LCSW Clinical Social Work [phone]   Angela D BOYD-GILYARD, LCSW 10/28/2016, 1:11 PM  

## 2016-10-28 NOTE — Progress Notes (Signed)
Post Partum Day #1 Subjective: no complaints, up ad lib, voiding and tolerating PO  Objective: Blood pressure (!) 100/47, pulse 77, temperature 97.8 F (36.6 C), temperature source Axillary, resp. rate 18, height 5\' 4"  (1.626 m), weight 170 lb (77.1 kg), last menstrual period 01/22/2016, SpO2 100 %, unknown if currently breastfeeding.  Physical Exam:  General: alert, cooperative and no distress Lochia: appropriate Uterine Fundus: firm Incision: none DVT Evaluation: No evidence of DVT seen on physical exam. No cords or calf tenderness. No significant calf/ankle edema.   Recent Labs  10/26/16 2347  HGB 12.1  HCT 35.5*    Assessment/Plan: Plan for discharge tomorrow and Breastfeeding   LOS: 1 day   Morene Crocker, CNM 10/28/2016, 7:22 AM

## 2016-10-29 MED ORDER — OXYCODONE-ACETAMINOPHEN 5-325 MG PO TABS: 1.0000 | ORAL_TABLET | Freq: Four times a day (QID) | ORAL | 0 refills | Status: DC | PRN

## 2016-10-29 MED ORDER — MEDROXYPROGESTERONE ACETATE 150 MG/ML IM SUSP: 150.0000 mg | INTRAMUSCULAR | 4 refills | Status: DC

## 2016-10-29 MED ORDER — IBUPROFEN 600 MG PO TABS: 600.0000 mg | ORAL_TABLET | Freq: Four times a day (QID) | ORAL | 2 refills | Status: DC

## 2016-10-29 MED ORDER — MEDROXYPROGESTERONE ACETATE 150 MG/ML IM SUSP
150.0000 mg | Freq: Once | INTRAMUSCULAR | Status: AC
Start: 2016-10-29 — End: 2016-10-29
  Administered 2016-10-29: 150 mg via INTRAMUSCULAR
  Filled 2016-10-29: qty 1

## 2016-10-29 MED ORDER — SENNOSIDES-DOCUSATE SODIUM 8.6-50 MG PO TABS: 2.0000 | ORAL_TABLET | Freq: Every day | ORAL | 1 refills | Status: DC

## 2016-10-29 NOTE — Progress Notes (Signed)
Post Partum Day #2 Subjective: no complaints, up ad lib, voiding and tolerating PO.  Tearful about infant under bili lights, encouragement given.  States that feeding is going well.    Objective: Blood pressure 104/66, pulse 92, temperature 98.3 F (36.8 C), temperature source Oral, resp. rate 18, height 5\' 4"  (1.626 m), weight 170 lb (77.1 kg), last menstrual period 01/22/2016, SpO2 100 %, unknown if currently breastfeeding.  Physical Exam:  General: alert, cooperative, no distress and tearful Lochia: appropriate Uterine Fundus: firm Incision: none DVT Evaluation: No evidence of DVT seen on physical exam. No cords or calf tenderness. No significant calf/ankle edema.   Recent Labs  10/26/16 2347  HGB 12.1  HCT 35.5*    Assessment/Plan: Discharge home, Breastfeeding and Contraception Depo injections.   LOS: 2 days   Morene Crocker, CNM 10/29/2016, 7:20 AM

## 2016-10-29 NOTE — Discharge Summary (Signed)
OB Discharge Summary     Patient Name: Angela Cortez DOB: 1991/02/14 MRN: [id number]  Date of admission: 10/26/2016 Delivering MD: [name] D   Date of discharge: 10/29/2016  Admitting diagnosis: 39 WKS, CTXS Intrauterine pregnancy: [redacted]w[redacted]d     Secondary diagnosis:  Principal Problem:   Supervision of normal pregnancy, antepartum Active Problems:   Limited prenatal care in second trimester   Mild tetrahydrocannabinol (THC) abuse  Additional problems: none     Discharge diagnosis: Term Pregnancy Delivered                                                                                                Post partum procedures:none  Augmentation: Pitocin  Complications: None  Hospital course:  Onset of Labor With Vaginal Delivery     26 y.o. yo [id number] at [redacted]w[redacted]d was admitted in Active Labor on 10/26/2016. Patient had an uncomplicated labor course as follows:  Membrane Rupture Time/Date: 12:43 AM ,10/27/2016   Intrapartum Procedures: Episiotomy: None [1]                                         Lacerations:  None [1]  Patient had a delivery of a Viable infant. 10/27/2016  Information for the patient's newborn:  Angela Cortez [[id number]]  Delivery Method: Vaginal, Spontaneous Delivery (Filed from Delivery Summary)    Pateint had an uncomplicated postpartum course.  She is ambulating, tolerating a regular diet, passing flatus, and urinating well. Patient is discharged home in stable condition on 10/29/16.   Physical exam  Vitals:   10/27/16 2200 10/28/16 0525 10/28/16 1810 10/29/16 0541  BP: 115/63 (!) 100/47 104/72 104/66  Pulse: 91 77 86 92  Resp: 20 18 18 18   Temp: 97.8 F (36.6 C) 97.8 F (36.6 C) 98 F (36.7 C) 98.3 F (36.8 C)  TempSrc: Axillary Axillary Axillary Oral  SpO2:      Weight:      Height:       General: alert, cooperative and no distress Lochia: appropriate Uterine Fundus: firm Incision: N/A DVT Evaluation: No evidence of DVT seen on  physical exam. No cords or calf tenderness. No significant calf/ankle edema. Labs: Lab Results  Component Value Date   WBC 8.9 10/26/2016   HGB 12.1 10/26/2016   HCT 35.5 (L) 10/26/2016   MCV 91.5 10/26/2016   PLT 229 10/26/2016   CMP Latest Ref Rng & Units 03/05/2016  Glucose 65 - 99 mg/dL 94  BUN 6 - 20 mg/dL 8  Creatinine 0.44 - 1.00 mg/dL 0.70  Sodium 135 - 145 mmol/L 133(L)  Potassium 3.5 - 5.1 mmol/L 3.7  Chloride 101 - 111 mmol/L 102  CO2 22 - 32 mmol/L 23  Calcium 8.9 - 10.3 mg/dL 9.5  Total Protein 6.5 - 8.1 g/dL 8.4(H)  Total Bilirubin 0.3 - 1.2 mg/dL 1.1  Alkaline Phos 38 - 126 U/L 46  AST 15 - 41 U/L 20  ALT 14 - 54 U/L 11(L)    Discharge instruction:  per After Visit Summary and "Baby and Me Booklet".  After visit meds:  Allergies as of 10/29/2016   No Known Allergies     Medication List    TAKE these medications   ibuprofen 600 MG tablet Commonly known as:  ADVIL,MOTRIN Take 1 tablet (600 mg total) by mouth every 6 (six) hours.   medroxyPROGESTERone 150 MG/ML injection Commonly known as:  DEPO-PROVERA Inject 1 mL (150 mg total) into the muscle every 3 (three) months. Bring to office for administration.   oxyCODONE-acetaminophen 5-325 MG tablet Commonly known as:  PERCOCET/ROXICET Take 1-2 tablets by mouth every 6 (six) hours as needed for moderate pain or severe pain.   PRENATAL ADULT GUMMY/DHA/FA PO Take 2 each by mouth daily.   senna-docusate 8.6-50 MG tablet Commonly known as:  Senokot-S Take 2 tablets by mouth at bedtime.       Diet: routine diet  Activity: Advance as tolerated. Pelvic rest for 6 weeks.   Outpatient follow up:4 weeks Follow up Appt:No future appointments. Follow up Visit:No Follow-up on file.  Postpartum contraception: Depo Provera  Newborn Data: Live born female  Birth Weight: 6 lb 13 oz (3090 g) APGAR: 8, 9  Baby Feeding: Breast Disposition:rooming in   10/29/2016 Morene Crocker, CNM

## 2016-10-29 NOTE — Progress Notes (Signed)
CSW acknowledged consult and met with Angela Cortez at [facility] bedside.  When CSW arrived, Angela Cortez was resting in bed. CSW informed Angela Cortez that staff was concerned about Angela Cortez's tearfulness and feelings of sadness. Angela Cortez acknowledged that Angela Cortez has been tearful and experienced feelings of sadness due to infant's medical condition.  Angela Cortez expressed that Angela Cortez's feelings of sadness is due to Angela Cortez's previous experience with Angela Cortez being d/c and Angela Cortez's oldest child having to be admitted to the NICU.  CSW validated and normalized Angela Cortez's thoughts and feelings.  CSW processed with Angela Cortez coping strategies to assist Angela Cortez with decreasing Angela Cortez tearfulness and feelings of sadness.   CSW also provided Angela Cortez with community resources for outpatient behavioral health counseling.   Laurey Arrow, MSW, LCSW Clinical Social Work [phone]

## 2016-10-29 NOTE — Lactation Note (Signed)
This note was copied from a baby's chart. Lactation Consultation Note  Patient Name: Angela Cortez [id number] Date: 10/29/2016 Reason for consult: Follow-up assessment;Hyperbilirubinemia Bilirubin increased over night.  Mom states baby is nursing well.  Supplemented with formula x 1.  Discussed importance of good, frequent feedings to stimulate stooling.  Symphony pump set up and initiated.  Instructed to put baby to breast with feeding cues and post pump x 15 minutes.  Breast milk will be given as supplement after BF.  Encouraged to call out with concerns/assist.   Maternal Data    Feeding Feeding Type: Breast Fed Length of feed: 15 min  LATCH Score/Interventions                      Lactation Tools Discussed/Used Pump Review: Setup, frequency, and cleaning;Milk Storage Initiated by:: [place] Date initiated:: 10/29/16   Consult Status Consult Status: Follow-up Date: 10/30/16 Follow-up type: In-patient    Ave Filter 10/29/2016, 9:09 AM

## 2016-10-30 ENCOUNTER — Encounter: Payer: Medicaid Other | Admitting: Certified Nurse Midwife

## 2016-10-30 ENCOUNTER — Ambulatory Visit: Payer: Self-pay

## 2016-10-30 NOTE — Lactation Note (Signed)
This note was copied from a baby's chart. Lactation Consultation Note  Patient Name: Angela Cortez [id number] Date: 10/30/2016 Reason for consult: Follow-up assessment Baby at 89 hr of life. Baby is currently on photo therapy. Mom was told that offering formula would help the baby's jaundice levels go down more quickly. She desires to ebf. She has been bf "some" and pumping every 3-4hr. She is reporting L nipple pain. There is a bruised horizontal compression stripe and a pinpoint bright red blister on the nipple surface. Give comfort gels. She reports pumping and latching hurt. She will use the #27 flanges and decrease suction to 3-4 drops. She will call for latch help on the L side. Encouraged bf on demand, post pumping, and supplementing per volume guidelines. She is aware of lactation services and support group.    Maternal Data    Feeding Feeding Type: Breast Fed  LATCH Score/Interventions                      Lactation Tools Discussed/Used     Consult Status Consult Status: Follow-up Date: 10/31/16 Follow-up type: In-patient    Denzil Hughes 10/30/2016, 8:46 PM

## 2016-10-31 ENCOUNTER — Ambulatory Visit: Payer: Self-pay

## 2016-10-31 NOTE — Lactation Note (Signed)
This note was copied from a baby's chart. Lactation Consultation Note  Patient Name: Angela Cortez [id number] Date: 10/31/2016 Reason for consult: Follow-up assessment Baby at 4 days of life. Mom is pumping to feed and offering formula. She is no longer latching baby. She has been pumping every 2 hr around the clock. Given labels and milk handling instructions. She will offer her expressed milk per volume guidelines and only offer formula if she does not have enough expressed milk. Reviewed breast changes. She is aware of lactation services and support group.     Maternal Data    Feeding Feeding Type: Breast Milk Nipple Type: Slow - flow  LATCH Score/Interventions                      Lactation Tools Discussed/Used     Consult Status Consult Status: Follow-up Date: 11/01/16 Follow-up type: In-patient    Denzil Hughes 10/31/2016, 5:46 PM

## 2016-11-01 ENCOUNTER — Ambulatory Visit: Payer: Self-pay

## 2016-11-01 NOTE — Lactation Note (Signed)
This note was copied from a baby's chart. Lactation Consultation Note: Mother is now breastfeeding infant more. She reports that infant is feeding well and she plans to try and breastfeed more at home. She states that her family is buying her an electric pump today. Mother was given a harmony hand pump with instructions to use . Mother uses a #27 flange. Mother advised to pump after each feeding for 15 mins on each breast. Mother denies having any pain or discomfort when breastfeeding. Mother has expressed breastmilk at the bedside for infants next feeding.  Reviewed clean equipment , and milk storage. Mother informed of available lactation services and community support. Mother receptive to all teaching.   Patient Name: Angela Cortez [id number] Date: 11/01/2016 Reason for consult: Follow-up assessment   Maternal Data    Feeding Feeding Type: Breast Milk Nipple Type: Slow - flow  LATCH Score/Interventions                      Lactation Tools Discussed/Used     Consult Status Consult Status: Complete    Darla Lesches 11/01/2016, 11:27 AM

## 2016-11-04 IMAGING — US US ABDOMEN LIMITED
2 series · 12 of 12 positions shown · non-contrast
Comparison: Pelvic ultrasound performed 04/24/2016

CLINICAL DATA: Acute onset of severe abdominal pain superior to the
umbilicus. Patient is 20 weeks pregnant. Initial encounter.

EXAM:
LIMITED ABDOMINAL ULTRASOUND

[Series 1: us abdomen limited · 5 acquisitions, 5 frames shown (1 of 2)]
[im 1/5]
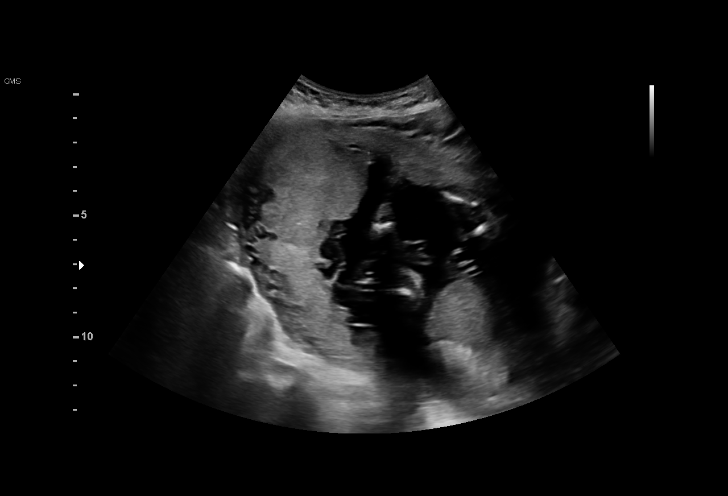
[im 2/5]
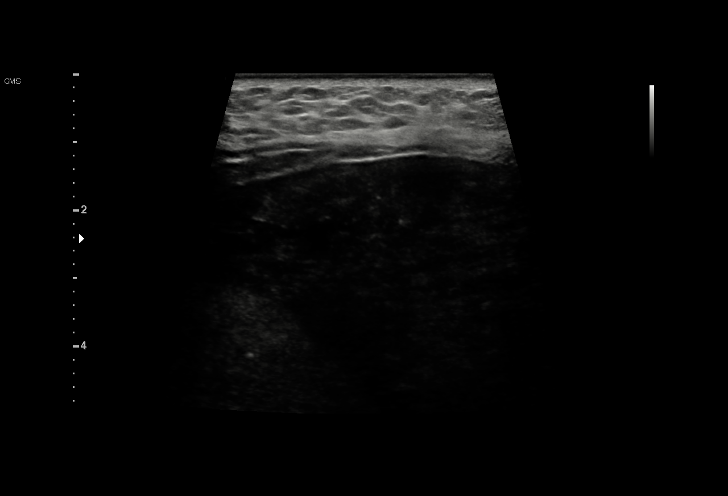
[im 3/5]
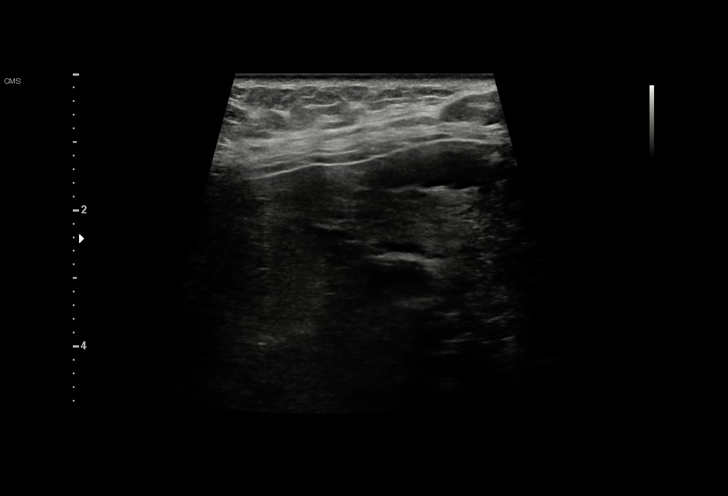
[im 4/5]
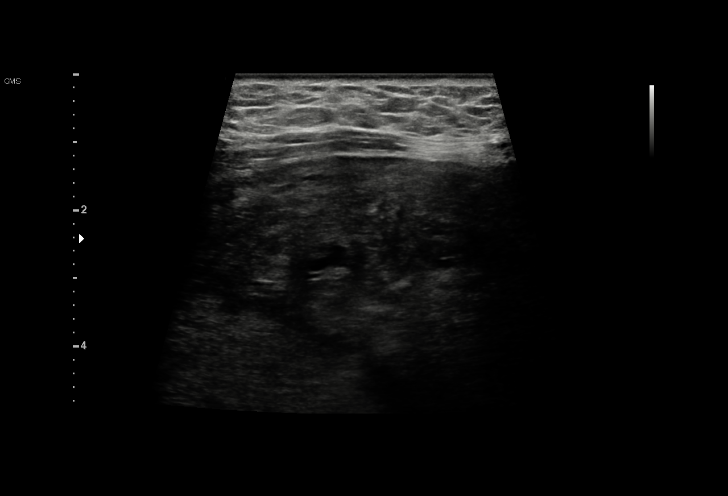
[im 5/5]
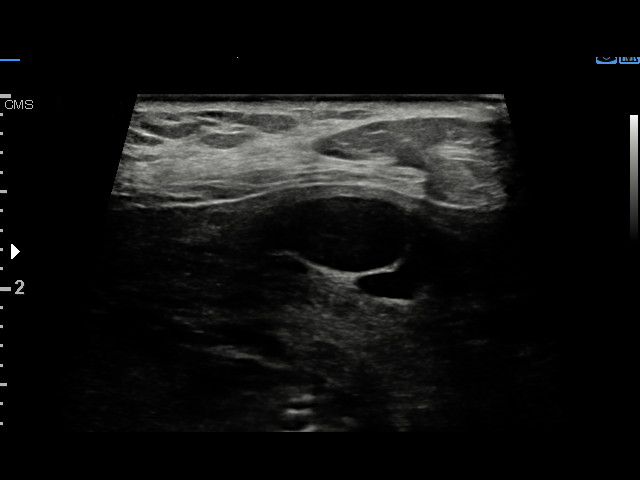

[Series 2: us abdomen limited · 7 acquisitions, 7 frames shown (2 of 2)]
[im 1/7]
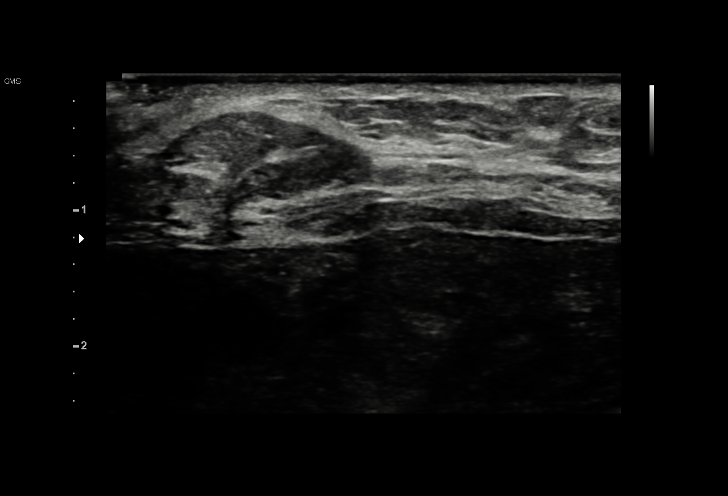
[im 2/7]
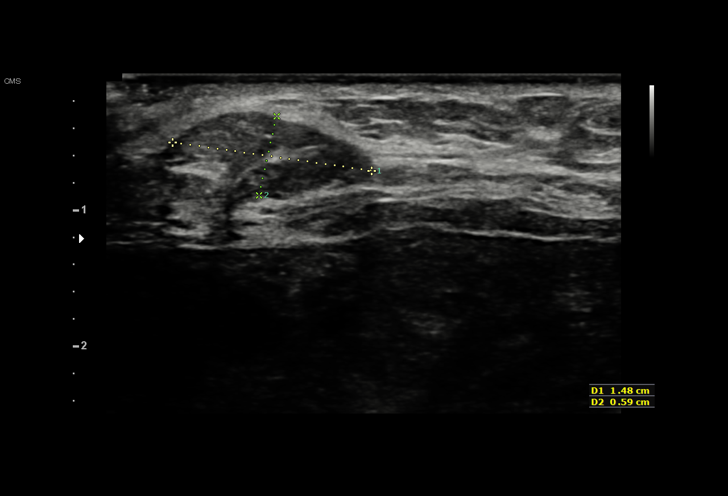
[im 3/7]
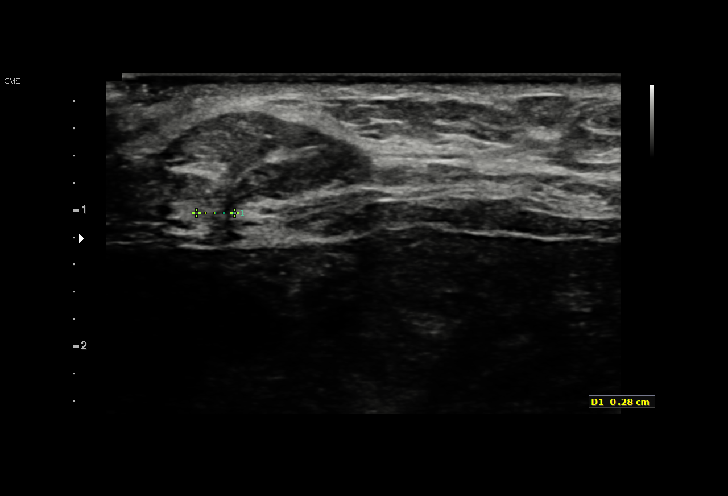
[im 4/7]
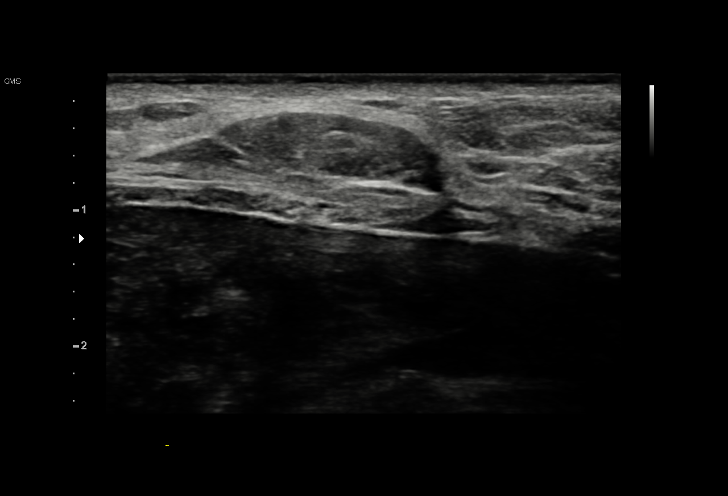
[im 5/7]
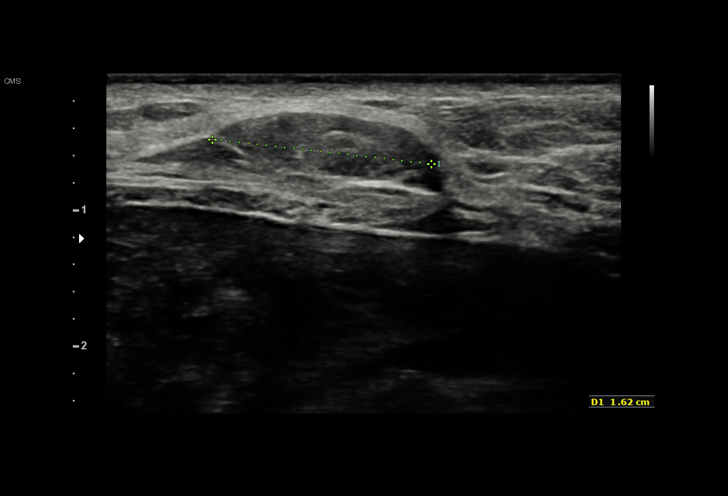
[im 6/7]
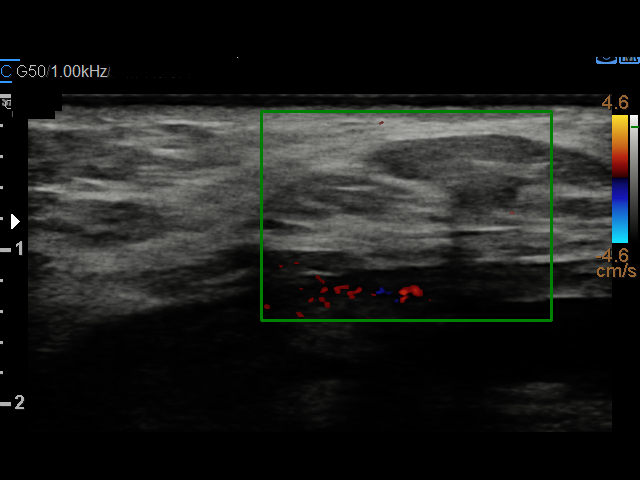
[im 7/7]
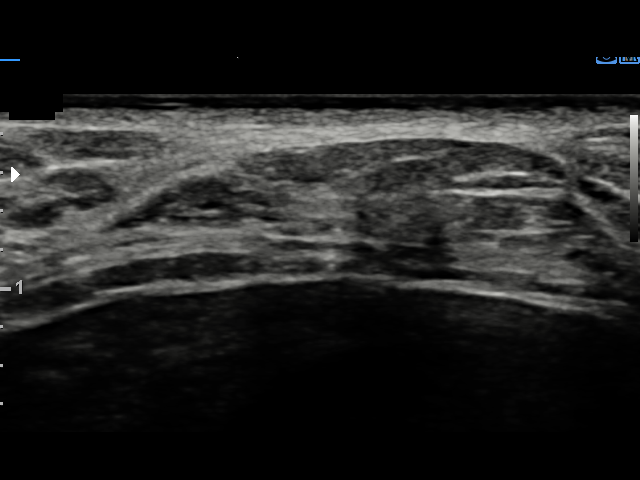

[12 of 12 positions shown; findings below may reference images not displayed]

FINDINGS: There is a focal well-circumscribed 1.6 x 1.5 x 0.6 cm mass
extending through the fascia superior to the umbilicus, with a neck
measuring 3 mm. Its echogenicity is compatible with focal fat, and
this likely reflects a small focal anterior abdominal wall hernia,
containing only fat. No significant abnormal associated blood flow
is seen. No suspicious masses are identified.
IMPRESSION: Small focal anterior abdominal wall hernia superior to the
umbilicus, containing only fat, measuring 1.6 x 1.5 x 0.6 cm, with a
relatively narrow neck.

## 2017-04-22 ENCOUNTER — Ambulatory Visit (INDEPENDENT_AMBULATORY_CARE_PROVIDER_SITE_OTHER): Payer: Medicaid Other

## 2017-04-22 ENCOUNTER — Encounter: Payer: Self-pay | Admitting: General Practice

## 2017-04-22 VITALS — BP 126/78 | HR 71

## 2017-04-22 DIAGNOSIS — Z3201 Encounter for pregnancy test, result positive: Secondary | ICD-10-CM | POA: Diagnosis not present

## 2017-04-22 LAB — POCT PREGNANCY, URINE: Preg Test, Ur: POSITIVE — AB

## 2017-04-22 NOTE — Progress Notes (Signed)
Patient presented to office today for a pregnancy test. Test confirms she is pregnant around 7 weeks. Patient reports taking no medications at this time. I have advised her to start taking prenatal vitamins if she desires. Patient has been given a letter of verification to apply for assistance.

## 2018-09-29 ENCOUNTER — Emergency Department (HOSPITAL_COMMUNITY)
Admission: EM | Admit: 2018-09-29 | Discharge: 2018-09-29 | Disposition: A | Discharge status: Home / Self Care | Payer: Medicaid Other | Attending: Emergency Medicine | Admitting: Emergency Medicine

## 2018-09-29 ENCOUNTER — Other Ambulatory Visit: Payer: Self-pay

## 2018-09-29 ENCOUNTER — Encounter (HOSPITAL_COMMUNITY): Payer: Self-pay

## 2018-09-29 DIAGNOSIS — N3001 Acute cystitis with hematuria: Secondary | ICD-10-CM | POA: Insufficient documentation

## 2018-09-29 DIAGNOSIS — Z87891 Personal history of nicotine dependence: Secondary | ICD-10-CM | POA: Insufficient documentation

## 2018-09-29 DIAGNOSIS — Z79899 Other long term (current) drug therapy: Secondary | ICD-10-CM | POA: Insufficient documentation

## 2018-09-29 DIAGNOSIS — A599 Trichomoniasis, unspecified: Secondary | ICD-10-CM

## 2018-09-29 LAB — WET PREP, GENITAL
Sperm: NONE SEEN
Yeast Wet Prep HPF POC: NONE SEEN

## 2018-09-29 LAB — COMPREHENSIVE METABOLIC PANEL
ALT: 12 U/L (ref 0–44)
AST: 18 U/L (ref 15–41)
Albumin: 4.1 g/dL (ref 3.5–5.0)
Alkaline Phosphatase: 51 U/L (ref 38–126)
Anion gap: 9 (ref 5–15)
BILIRUBIN TOTAL: 0.7 mg/dL (ref 0.3–1.2)
BUN: 9 mg/dL (ref 6–20)
CHLORIDE: 103 mmol/L (ref 98–111)
CO2: 25 mmol/L (ref 22–32)
CREATININE: 0.95 mg/dL (ref 0.44–1.00)
Calcium: 9.2 mg/dL (ref 8.9–10.3)
Glucose, Bld: 109 mg/dL — ABNORMAL HIGH (ref 70–99)
POTASSIUM: 4.2 mmol/L (ref 3.5–5.1)
Sodium: 137 mmol/L (ref 135–145)
TOTAL PROTEIN: 7.5 g/dL (ref 6.5–8.1)

## 2018-09-29 LAB — I-STAT BETA HCG BLOOD, ED (MC, WL, AP ONLY): I-stat hCG, quantitative: <5 m[IU]/mL (ref <5)

## 2018-09-29 LAB — URINALYSIS, ROUTINE W REFLEX MICROSCOPIC
Bilirubin Urine: NEGATIVE
GLUCOSE, UA: NEGATIVE mg/dL
HGB URINE DIPSTICK: NEGATIVE
Ketones, ur: NEGATIVE mg/dL
NITRITE: NEGATIVE
PROTEIN: NEGATIVE mg/dL
SPECIFIC GRAVITY, URINE: 1.026 (ref 1.005–1.030)
pH: 5 (ref 5.0–8.0)

## 2018-09-29 LAB — CBC
HEMATOCRIT: 42.5 % (ref 36.0–46.0)
HEMOGLOBIN: 13.6 g/dL (ref 12.0–15.0)
MCH: 30.5 pg (ref 26.0–34.0)
MCHC: 32 g/dL (ref 30.0–36.0)
MCV: 95.3 fL (ref 80.0–100.0)
NRBC: 0 % (ref 0.0–0.2)
Platelets: 268 10*3/uL (ref 150–400)
RBC: 4.46 MIL/uL (ref 3.87–5.11)
RDW: 12.6 % (ref 11.5–15.5)
WBC: 6.9 10*3/uL (ref 4.0–10.5)

## 2018-09-29 LAB — LIPASE, BLOOD: LIPASE: 22 U/L (ref 11–51)

## 2018-09-29 MED ORDER — AZITHROMYCIN 250 MG PO TABS
1000.0000 mg | ORAL_TABLET | Freq: Once | ORAL | Status: AC
  Administered 2018-09-29: 1000 mg via ORAL
  Filled 2018-09-29: qty 4

## 2018-09-29 MED ORDER — METRONIDAZOLE 500 MG PO TABS
2000.0000 mg | ORAL_TABLET | Freq: Once | ORAL | Status: AC
  Administered 2018-09-29: 2000 mg via ORAL
  Filled 2018-09-29: qty 4

## 2018-09-29 MED ORDER — SODIUM CHLORIDE 0.9% FLUSH: 3.0000 mL | Freq: Once | INTRAVENOUS | Status: DC

## 2018-09-29 MED ORDER — CEFTRIAXONE SODIUM 250 MG IJ SOLR
250.0000 mg | Freq: Once | INTRAMUSCULAR | Status: AC
  Administered 2018-09-29: 250 mg via INTRAMUSCULAR
  Filled 2018-09-29: qty 250

## 2018-09-29 MED ORDER — STERILE WATER FOR INJECTION IJ SOLN
INTRAMUSCULAR | Status: AC
  Administered 2018-09-29: 10 mL
  Filled 2018-09-29: qty 10

## 2018-09-29 MED ORDER — CEPHALEXIN 500 MG PO CAPS: 500.0000 mg | ORAL_CAPSULE | Freq: Two times a day (BID) | ORAL | 0 refills | Status: DC

## 2018-09-29 MED ORDER — ONDANSETRON 4 MG PO TBDP
4.0000 mg | ORAL_TABLET | Freq: Once | ORAL | Status: AC
  Administered 2018-09-29: 4 mg via ORAL
  Filled 2018-09-29: qty 1

## 2018-09-29 NOTE — ED Notes (Signed)
Declined W/C at D/C and was escorted to lobby by RN. 

## 2018-09-29 NOTE — ED Provider Notes (Signed)
[facility] EM[facility]GENC[facility] DEPARTMENT Provider Note   CSN: [id number] Arrival date & time: 09/29/18  0732    History   Chief Complaint Chief Complaint  Patient presents with  . Hand Pain  . Abdominal Pain    HPI Angela Cortez is a 28 y.o. female.     HPI   28 year old female presents today with multiple complaints.  Patient notes in September she developed cramping to her lower pelvis.  She notes this feels like menstrual cramping but not when she is having her menstrual cycle.  She notes this goes away, no cramping presently.  Patient notes over the last month she has had odorous discharge.  She notes previous diagnosis of bacterial vaginosis but did not take the medicine as she could not afford it.  Reports she is sexually active with female partner.  Patient also notes chronic back pain after an epidural, no significant changes no neurological deficits.  Patient also notes pain to her left pinky and ulnar hand, no swelling or edema or redness, minor pain with palpation of the ulnar hand.  No trauma.  [name] is right-hand dominant.  Denies any urinary complaints but does note some pain over the suprapubic region.      Past Medical History:  Diagnosis Date  . No pertinent past medical history     Patient Active Problem List   Diagnosis Date Noted  . Limited prenatal care in third trimester 10/15/2016  . Mild tetrahydrocannabinol (THC) abuse 10/15/2016  . Limited prenatal care in second trimester 08/06/2016  . Supervision of normal pregnancy, antepartum 03/29/2016    Past Surgical History:  Procedure Laterality Date  . ACROMIO-CLAVICULAR JOINT REPAIR    . CLAVICLE SURGERY       OB History    Gravida  2   Para  2   Term  2   Preterm      AB      Living  2     SAB      TAB      Ectopic      Multiple  0   Live Births  1            Home Medications    Prior to Admission medications   Medication Sig Start Date End Date  Taking? Authorizing Provider  cephALEXin (KEFLEX) 500 MG capsule Take 1 capsule (500 mg total) by mouth 2 (two) times daily. 09/29/18   Kaicen Desena, Dellis Filbert, PA-C  ibuprofen (ADVIL,MOTRIN) 600 MG tablet Take 1 tablet (600 mg total) by mouth every 6 (six) hours. Patient not taking: Reported on 04/22/2017 10/29/16   Kandis Cocking A, CNM  medroxyPROGESTERone (DEPO-PROVERA) 150 MG/ML injection Inject 1 mL (150 mg total) into the muscle every 3 (three) months. Bring to office for administration. Patient not taking: Reported on 04/22/2017 10/29/16   Morene Crocker, CNM  oxyCODONE-acetaminophen (PERCOCET/ROXICET) 5-325 MG tablet Take 1-2 tablets by mouth every 6 (six) hours as needed for moderate pain or severe pain. Patient not taking: Reported on 04/22/2017 10/29/16   Morene Crocker, CNM  Prenatal MV & Min w/FA-DHA (PRENATAL ADULT GUMMY/DHA/FA PO) Take 2 each by mouth daily.     [provider]  senna-docusate (SENOKOT-S) 8.6-50 MG tablet Take 2 tablets by mouth at bedtime. Patient not taking: Reported on 04/22/2017 10/29/16   Morene Crocker, CNM    Family History Family History  Problem Relation Age of Onset  . Diabetes Father   . Hypertension Father   .  Cancer Paternal Uncle   . Cancer Paternal Grandmother     Social History Social History   Tobacco Use  . Smoking status: Former Smoker    Packs/day: 0.25    Types: Cigarettes    Last attempt to quit: 02/04/2016    Years since quitting: 2.6  . Smokeless tobacco: Never Used  Substance Use Topics  . Alcohol use: No    Comment: socially previous to pregnancy  . Drug use: No     Allergies   Patient has no known allergies.   Review of Systems Review of Systems  All other systems reviewed and are negative.   Physical Exam Updated Vital Signs BP 114/72 (BP Location: Right Arm)   Pulse 95   Temp 98.2 F (36.8 C) (Oral)   Resp 18   LMP 08/30/2018 (Approximate)   SpO2 100%   Physical Exam Vitals signs and nursing  note reviewed.  Constitutional:      Appearance: She is well-developed.  HENT:     Head: Normocephalic and atraumatic.  Eyes:     General: No scleral icterus.       Right eye: No discharge.        Left eye: No discharge.     Conjunctiva/sclera: Conjunctivae normal.     Pupils: Pupils are equal, round, and reactive to light.  Neck:     Musculoskeletal: Normal range of motion.     Vascular: No JVD.     Trachea: No tracheal deviation.  Pulmonary:     Effort: Pulmonary effort is normal.     Breath sounds: No stridor.  Abdominal:     Comments: Minimal tenderness palpation of the suprapubic region  Genitourinary:    Comments: Small appearing discharge noted in vaginal vault, no cervical motion tenderness Musculoskeletal:     Comments: Right hand atraumatic no swelling or edema, minor tenderness to palpation of the ulnar aspect full active range of motion at the wrist PIP MCP and DIP  No midline back tenderness, minor tenderness palpation of left lower lumbar musculature, bilateral lower extremity sensation strength motor function intact  Neurological:     Mental Status: She is alert and oriented to person, place, and time.     Coordination: Coordination normal.  Psychiatric:        Behavior: Behavior normal.        Thought Content: Thought content normal.        Judgment: Judgment normal.     ED Treatments / Results  Labs (all labs ordered are listed, but only abnormal results are displayed) Labs Reviewed  WET PREP, GENITAL - Abnormal; Notable for the following components:      Result Value   Trich, Wet Prep PRESENT (*)    Clue Cells Wet Prep HPF POC PRESENT (*)    WBC, Wet Prep HPF POC MANY (*)    All other components within normal limits  COMPREHENSIVE METABOLIC PANEL - Abnormal; Notable for the following components:   Glucose, Bld 109 (*)    All other components within normal limits  URINALYSIS, ROUTINE W REFLEX MICROSCOPIC - Abnormal; Notable for the following  components:   APPearance HAZY (*)    Leukocytes,Ua LARGE (*)    Bacteria, UA MANY (*)    All other components within normal limits  LIPASE, BLOOD  CBC  I-STAT BETA HCG BLOOD, ED ([facility], [facility], [facility] ONLY)  GC/CHLAMYDIA PROBE AMP ([place]) NOT AT [facility]    EKG None  Radiology No results found.  Procedures Procedures (  including critical care time)  Medications Ordered in ED Medications  cefTRIAXone (ROCEPHIN) injection 250 mg (250 mg Intramuscular Given 09/29/18 1032)  azithromycin (ZITHROMAX) tablet 1,000 mg (1,000 mg Oral Given 09/29/18 1034)  sterile water (preservative free) injection (10 mLs  Given 09/29/18 1033)  ondansetron (ZOFRAN-ODT) disintegrating tablet 4 mg (4 mg Oral Given 09/29/18 1106)  metroNIDAZOLE (FLAGYL) tablet 2,000 mg (2,000 mg Oral Given 09/29/18 1106)     Initial Impression / Assessment and Plan / ED Course  I have reviewed the triage vital signs and the nursing notes.  Pertinent labs & imaging results that were available during my care of the patient were reviewed by me and considered in my medical decision making (see chart for details).          Therapeutics: Metronidazole, ceftriaxone, azithromycin  Discharge Meds: Keflex  Assessment/Plan: 28 year old female presents today with pelvic discomfort.  She describes this as cramping that comes and goes.  No significant pain on my exam today although she does have minimal suprapubic tenderness.  Patient's exam concerning for STD.  She is treated for trichomoniasis gonorrhea and chlamydia.  Patient also with findings consistent with UTI.  Should be treated with Keflex.  She is encouraged to return immediately she develops any new or worsening signs or symptoms.  Verbalized understanding and agreement to today's plan had no further questions or concerns.    Final Clinical Impressions(s) / ED Diagnoses   Final diagnoses:  Trichimoniasis  Acute cystitis with hematuria    ED Discharge Orders          Ordered    cephALEXin (KEFLEX) 500 MG capsule  2 times daily     09/29/18 1100           Okey Regal, PA-C 09/29/18 [place], [place], DO 09/29/18 1559

## 2018-09-29 NOTE — ED Triage Notes (Signed)
Pt here for multiple complaints. She states she has had right pinky pain. She reports she has also had abdominal pain and back pain. She reports she was diagnosed with BV last year but medicaid was not working and so she never got medications. Was taking flagyl intermittently from a friend.

## 2018-09-29 NOTE — Discharge Instructions (Addendum)
Please read attached information. If you experience any new or worsening signs or symptoms please return to the emergency room for evaluation. Please follow-up with your primary care provider or specialist as discussed. Please use medication prescribed only as directed and discontinue taking if you have any concerning signs or symptoms.   °

## 2018-09-30 LAB — GC/CHLAMYDIA PROBE AMP (~~LOC~~) NOT AT ARMC
Chlamydia: NEGATIVE
Neisseria Gonorrhea: POSITIVE — AB

## 2018-12-17 ENCOUNTER — Other Ambulatory Visit: Payer: Self-pay

## 2018-12-17 ENCOUNTER — Inpatient Hospital Stay (HOSPITAL_COMMUNITY)
Admission: AD | Admit: 2018-12-17 | Discharge: 2018-12-17 | Discharge status: Against Medical Advice | Payer: Self-pay | Attending: Obstetrics & Gynecology | Admitting: Obstetrics & Gynecology

## 2018-12-17 DIAGNOSIS — Z5321 Procedure and treatment not carried out due to patient leaving prior to being seen by health care provider: Secondary | ICD-10-CM | POA: Insufficient documentation

## 2018-12-17 DIAGNOSIS — Z3201 Encounter for pregnancy test, result positive: Secondary | ICD-10-CM | POA: Insufficient documentation

## 2018-12-17 LAB — URINALYSIS, ROUTINE W REFLEX MICROSCOPIC
Bilirubin Urine: NEGATIVE
Glucose, UA: NEGATIVE mg/dL
Ketones, ur: NEGATIVE mg/dL
Nitrite: NEGATIVE
Protein, ur: NEGATIVE mg/dL
Specific Gravity, Urine: 1.023 (ref 1.005–1.030)
pH: 7 (ref 5.0–8.0)

## 2018-12-17 LAB — POCT PREGNANCY, URINE: Preg Test, Ur: POSITIVE — AB

## 2018-12-17 NOTE — Progress Notes (Addendum)
Pt called again to be seen, no answer, no longer sitting in waiting room.  Left prior to be seen.

## 2018-12-17 NOTE — Progress Notes (Signed)
Pt called again to put into a room, not in lobby.

## 2018-12-17 NOTE — MAU Note (Signed)
Presents with c/o VB that began this morning.  LMP 11/02/2018.  Reports +HPT x2.

## 2018-12-17 NOTE — Progress Notes (Signed)
Pt instructed to have a seat in waiting room due to pregnancy confirmation needed.  Testing completed, +UPT. Pt called from waiting to be put in room, pt not there.  Registration reports pt left waiting area.  Will call pt again in 10 minutes for room placement.

## 2018-12-30 ENCOUNTER — Ambulatory Visit (HOSPITAL_COMMUNITY)
Admission: EM | Admit: 2018-12-30 | Discharge: 2018-12-30 | Disposition: A | Discharge status: Home / Self Care | Payer: Self-pay | Attending: Family Medicine | Admitting: Family Medicine

## 2018-12-30 ENCOUNTER — Other Ambulatory Visit: Payer: Self-pay

## 2018-12-30 ENCOUNTER — Encounter (HOSPITAL_COMMUNITY): Payer: Self-pay

## 2018-12-30 DIAGNOSIS — N898 Other specified noninflammatory disorders of vagina: Secondary | ICD-10-CM | POA: Insufficient documentation

## 2018-12-30 DIAGNOSIS — Z3202 Encounter for pregnancy test, result negative: Secondary | ICD-10-CM

## 2018-12-30 LAB — POCT URINALYSIS DIP (DEVICE)
Bilirubin Urine: NEGATIVE
Glucose, UA: NEGATIVE mg/dL
Ketones, ur: NEGATIVE mg/dL
Nitrite: NEGATIVE
Protein, ur: NEGATIVE mg/dL
Specific Gravity, Urine: >=1.03 (ref 1.005–1.030)
Urobilinogen, UA: 1 mg/dL (ref 0.0–1.0)
pH: 6 (ref 5.0–8.0)

## 2018-12-30 LAB — POCT PREGNANCY, URINE: Preg Test, Ur: POSITIVE — AB

## 2018-12-30 MED ORDER — METRONIDAZOLE 500 MG PO TABS: 500.0000 mg | ORAL_TABLET | Freq: Two times a day (BID) | ORAL | 0 refills | Status: AC

## 2018-12-30 MED ORDER — CEFTRIAXONE SODIUM 250 MG IJ SOLR
INTRAMUSCULAR | Status: AC
  Filled 2018-12-30: qty 250

## 2018-12-30 MED ORDER — AZITHROMYCIN 250 MG PO TABS
ORAL_TABLET | ORAL | Status: AC
  Filled 2018-12-30: qty 4

## 2018-12-30 NOTE — Discharge Instructions (Addendum)
Begin taking metronidazole twice daily for the next week.  Do not drink alcohol until 24 hours after the last tablet.  This will treat for both BV as well as trichomonas.  We are testing you for Gonorrhea, Chlamydia, Trichomonas, Yeast and Bacterial Vaginosis. We will call you if anything is positive and let you know if you require any further treatment. Please inform partners of any positive results.   Please return if symptoms not improving with treatment, development of fever, nausea, vomiting, abdominal pain.

## 2018-12-30 NOTE — ED Notes (Signed)
A urine analysis and pregnancy test was completed today. Pregnancy test was positive.  Performed by TB at 9:31am

## 2018-12-30 NOTE — ED Provider Notes (Signed)
[facility]    CSN: [id number] Arrival date & time: 12/30/18  0900     History   Chief Complaint Chief Complaint  Patient presents with  . Vaginal Discharge    HPI Angela Cortez is a 28 y.o. female no contributing past medical history presenting today for evaluation of vaginal discharge.  Patient states that over the past couple months she has had discharge with associated odor.  She notes that on 2/18 she was seen in the emergency room for discharge from pelvic pain and was positive for gonorrhea and trichomonas as well as BV.  Her symptoms resolved for approximately 1 week, but discharge returned.  She has not had pain that she previously had associated with this.  Denies fever, nausea or vomiting.  She does note that she had a recent abortion on 5/8.  Denies persistent bleeding or pain.  Notes that the discharge is thin and clear.  Does frequently use scented soaps to clean.  Does have a new partner, but not denies specific concerns for STDs.  Denies urinary symptoms of dysuria or increased frequency or hematuria.  Denies associated itching or irritation.  HPI  Past Medical History:  Diagnosis Date  . No pertinent past medical history     Patient Active Problem List   Diagnosis Date Noted  . Limited prenatal care in third trimester 10/15/2016  . Mild tetrahydrocannabinol (THC) abuse 10/15/2016  . Limited prenatal care in second trimester 08/06/2016  . Supervision of normal pregnancy, antepartum 03/29/2016    Past Surgical History:  Procedure Laterality Date  . ACROMIO-CLAVICULAR JOINT REPAIR    . CLAVICLE SURGERY      OB History    Gravida  2   Para  2   Term  2   Preterm      AB      Living  2     SAB      TAB      Ectopic      Multiple  0   Live Births  1            Home Medications    Prior to Admission medications   Medication Sig Start Date End Date Taking? Authorizing Provider  metroNIDAZOLE (FLAGYL) 500 MG tablet Take  1 tablet (500 mg total) by mouth 2 (two) times daily for 7 days. 12/30/18 01/06/19  Audwin Semper, Elesa Hacker, PA-C    Family History Family History  Problem Relation Age of Onset  . Diabetes Father   . Hypertension Father   . Cancer Paternal Uncle   . Cancer Paternal Grandmother     Social History Social History   Tobacco Use  . Smoking status: Former Smoker    Packs/day: 0.25    Types: Cigarettes    Last attempt to quit: 02/04/2016    Years since quitting: 2.9  . Smokeless tobacco: Never Used  Substance Use Topics  . Alcohol use: No    Comment: socially previous to pregnancy  . Drug use: No     Allergies   Patient has no known allergies.   Review of Systems Review of Systems  Constitutional: Negative for fever.  Respiratory: Negative for shortness of breath.   Cardiovascular: Negative for chest pain.  Gastrointestinal: Negative for abdominal pain, diarrhea, nausea and vomiting.  Genitourinary: Positive for vaginal discharge. Negative for dysuria, flank pain, genital sores, hematuria, menstrual problem, vaginal bleeding and vaginal pain.  Musculoskeletal: Negative for back pain.  Skin: Negative for rash.  Neurological:  Negative for dizziness, light-headedness and headaches.     Physical Exam Triage Vital Signs ED Triage Vitals  Enc Vitals Group     BP 12/30/18 0915 125/76     Pulse Rate 12/30/18 0915 72     Resp 12/30/18 0915 18     Temp 12/30/18 0915 98.2 F (36.8 C)     Temp Source 12/30/18 0915 Oral     SpO2 12/30/18 0915 100 %     Weight --      Height --      Head Circumference --      Peak Flow --      Pain Score 12/30/18 0913 0     Pain Loc --      Pain Edu? --      Excl. in [place]? --    No data found.  Updated Vital Signs BP 125/76 (BP Location: Right Arm)   Pulse 72   Temp 98.2 F (36.8 C) (Oral)   Resp 18   SpO2 100%   Visual Acuity Right Eye Distance:   Left Eye Distance:   Bilateral Distance:    Right Eye Near:   Left Eye Near:     Bilateral Near:     Physical Exam Vitals signs and nursing note reviewed.  Constitutional:      Appearance: She is well-developed.     Comments: No acute distress  HENT:     Head: Normocephalic and atraumatic.     Nose: Nose normal.  Eyes:     Conjunctiva/sclera: Conjunctivae normal.  Neck:     Musculoskeletal: Neck supple.  Cardiovascular:     Rate and Rhythm: Normal rate.  Pulmonary:     Effort: Pulmonary effort is normal. No respiratory distress.  Abdominal:     General: There is no distension.     Comments: Soft, nondistended, nontender to light deep palpation throughout entire abdomen  Genitourinary:    Comments: Deferred Musculoskeletal: Normal range of motion.  Skin:    General: Skin is warm and dry.  Neurological:     Mental Status: She is alert and oriented to person, place, and time.      UC Treatments / Results  Labs (all labs ordered are listed, but only abnormal results are displayed) Labs Reviewed  POC URINE PREG, ED  CERVICOVAGINAL ANCILLARY ONLY    EKG None  Radiology No results found.  Procedures Procedures (including critical care time)  Medications Ordered in UC Medications - No data to display  Initial Impression / Assessment and Plan / UC Course  I have reviewed the triage vital signs and the nursing notes.  Pertinent labs & imaging results that were available during my care of the patient were reviewed by me and considered in my medical decision making (see chart for details).    Pregnancy test positive, likely still positive from recent abortion.  Swab obtained and will send off to check for STDs as well as BV and yeast.  Will call patient after treatment as needed. Patient with vaginal discharge, no pain, will treat for BV today given associated odor and previous history of this.  Metronidazole twice daily x1 week.  Continue to monitor,Discussed strict return precautions. Patient verbalized understanding and is agreeable with plan.   Final Clinical Impressions(s) / UC Diagnoses   Final diagnoses:  Vaginal discharge     Discharge Instructions     Begin taking metronidazole twice daily for the next week.  Do not drink alcohol until 24 hours after the  last tablet.  This will treat for both BV as well as trichomonas.  We are testing you for Gonorrhea, Chlamydia, Trichomonas, Yeast and Bacterial Vaginosis. We will call you if anything is positive and let you know if you require any further treatment. Please inform partners of any positive results.   Please return if symptoms not improving with treatment, development of fever, nausea, vomiting, abdominal pain.    ED Prescriptions    Medication Sig Dispense Auth. Provider   metroNIDAZOLE (FLAGYL) 500 MG tablet Take 1 tablet (500 mg total) by mouth 2 (two) times daily for 7 days. 14 tablet Kyran Connaughton, Decker C, PA-C     Controlled Substance Prescriptions [place] Controlled Substance Registry consulted? Not Applicable   Janith Lima, [place] 12/30/18 [phone]

## 2018-12-30 NOTE — ED Triage Notes (Signed)
Patient presents to Urgent Care with complaints of vaginal discharge since having unprotected sexual intercourse despite recently being treated for trichomonas and gonorrhea. Patient reports the discharge is malodorous, returned a month ago.

## 2018-12-31 ENCOUNTER — Telehealth (HOSPITAL_COMMUNITY): Payer: Self-pay | Admitting: Emergency Medicine

## 2018-12-31 LAB — CERVICOVAGINAL ANCILLARY ONLY
Bacterial vaginitis: POSITIVE — AB
Candida vaginitis: NEGATIVE
Chlamydia: NEGATIVE
Neisseria Gonorrhea: NEGATIVE
Trichomonas: POSITIVE — AB

## 2018-12-31 NOTE — Telephone Encounter (Signed)
Patient contacted and made aware of all results, all questions answered.   

## 2018-12-31 NOTE — Telephone Encounter (Signed)
Bacterial Vaginosis test is positive.  Prescription for metronidazole was given at the urgent care visit. Pt contacted regarding results. Answered all questions. Verbalized understanding.  Trichomonas is positive. Rx metronidazole was given at the urgent care visit. Pt needs education to please refrain from sexual intercourse for 7 days to give the medicine time to work. Sexual partners need to be notified and tested/treated. Condoms may reduce risk of reinfection. Recheck for further evaluation if symptoms are not improving.   Attempted to reach patient. No answer at this time. No voicemail

## 2019-03-23 ENCOUNTER — Other Ambulatory Visit: Payer: Self-pay

## 2019-03-23 ENCOUNTER — Encounter (HOSPITAL_COMMUNITY): Payer: Self-pay

## 2019-03-23 ENCOUNTER — Ambulatory Visit (HOSPITAL_COMMUNITY)
Admission: EM | Admit: 2019-03-23 | Discharge: 2019-03-23 | Disposition: A | Discharge status: Home / Self Care | Payer: Self-pay | Attending: Family Medicine | Admitting: Family Medicine

## 2019-03-23 DIAGNOSIS — N898 Other specified noninflammatory disorders of vagina: Secondary | ICD-10-CM | POA: Insufficient documentation

## 2019-03-23 LAB — POCT PREGNANCY, URINE: Preg Test, Ur: NEGATIVE

## 2019-03-23 MED ORDER — AZITHROMYCIN 250 MG PO TABS
1000.0000 mg | ORAL_TABLET | Freq: Once | ORAL | Status: AC
  Administered 2019-03-23: 11:00:00 1000 mg via ORAL

## 2019-03-23 MED ORDER — METRONIDAZOLE 500 MG PO TABS: 500.0000 mg | ORAL_TABLET | Freq: Two times a day (BID) | ORAL | 0 refills | Status: DC

## 2019-03-23 MED ORDER — AZITHROMYCIN 250 MG PO TABS
ORAL_TABLET | ORAL | Status: AC
  Filled 2019-03-23: qty 4

## 2019-03-23 MED ORDER — CEFTRIAXONE SODIUM 250 MG IJ SOLR
250.0000 mg | Freq: Once | INTRAMUSCULAR | Status: AC
  Administered 2019-03-23: 250 mg via INTRAMUSCULAR

## 2019-03-23 MED ORDER — CEFTRIAXONE SODIUM 250 MG IJ SOLR
INTRAMUSCULAR | Status: AC
  Filled 2019-03-23: qty 250

## 2019-03-23 NOTE — ED Triage Notes (Signed)
Pt presents with abnormal vaginal discharge with a grey tint and patient states her vaginal area has been irritated over the past week.  Pt states she has had 2 abortions back to back; one in may and another in June; she has not had a period since mid April and is not on any birth control.

## 2019-03-23 NOTE — ED Provider Notes (Signed)
[facility]    CSN: [id number] Arrival date & time: 03/23/19  0944     History   Chief Complaint Chief Complaint  Patient presents with  . Vaginal Discharge    HPI Angela Cortez is a 28 y.o. female.   Patient is a 28 year old female presents today with approximate 1 week of abnormal vaginal discharge.  Reporting is a yellow/gray tent.  There is been some vaginal irritation.  Denies any itching.  Also complaining of some groin swelling.  Patient had an elective abortion in May and in June.  She has not had a menstrual cycle since April.  She is not currently on any birth control and sexually active with multiple partners.  High concern for STDs.  Denies any associate abdominal pain, back pain, fevers, dysuria, hematuria or urinary frequency.  ROS per HPI      Past Medical History:  Diagnosis Date  . No pertinent past medical history     Patient Active Problem List   Diagnosis Date Noted  . Limited prenatal care in third trimester 10/15/2016  . Mild tetrahydrocannabinol (THC) abuse 10/15/2016  . Limited prenatal care in second trimester 08/06/2016  . Supervision of normal pregnancy, antepartum 03/29/2016    Past Surgical History:  Procedure Laterality Date  . ACROMIO-CLAVICULAR JOINT REPAIR    . CLAVICLE SURGERY      OB History    Gravida  4   Para  2   Term  2   Preterm      AB  2   Living  2     SAB      TAB      Ectopic      Multiple  0   Live Births  1            Home Medications    Prior to Admission medications   Medication Sig Start Date End Date Taking? Authorizing Provider  metroNIDAZOLE (FLAGYL) 500 MG tablet Take 1 tablet (500 mg total) by mouth 2 (two) times daily. 03/23/19   Orvan July, NP    Family History Family History  Problem Relation Age of Onset  . Diabetes Father   . Hypertension Father   . Cancer Paternal Uncle   . Cancer Paternal Grandmother     Social History Social History   Tobacco  Use  . Smoking status: Former Smoker    Packs/day: 0.25    Types: Cigarettes    Quit date: 02/04/2016    Years since quitting: 3.1  . Smokeless tobacco: Never Used  Substance Use Topics  . Alcohol use: No    Comment: socially previous to pregnancy  . Drug use: No     Allergies   Patient has no known allergies.   Review of Systems Review of Systems   Physical Exam Triage Vital Signs ED Triage Vitals  Enc Vitals Group     BP 03/23/19 1016 119/75     Pulse Rate 03/23/19 1016 84     Resp 03/23/19 1016 18     Temp 03/23/19 1016 98.4 F (36.9 C)     Temp Source 03/23/19 1016 Oral     SpO2 03/23/19 1016 98 %     Weight --      Height --      Head Circumference --      Peak Flow --      Pain Score 03/23/19 1026 0     Pain Loc --  Pain Edu? --      Excl. in [place]? --    No data found.  Updated Vital Signs BP 119/75 (BP Location: Left Arm)   Pulse 84   Temp 98.4 F (36.9 C) (Oral)   Resp 18   SpO2 98%   Visual Acuity Right Eye Distance:   Left Eye Distance:   Bilateral Distance:    Right Eye Near:   Left Eye Near:    Bilateral Near:     Physical Exam Vitals signs and nursing note reviewed.  Constitutional:      General: She is not in acute distress.    Appearance: Normal appearance. She is not ill-appearing, toxic-appearing or diaphoretic.  HENT:     Head: Normocephalic and atraumatic.     Nose: Nose normal.  Eyes:     Conjunctiva/sclera: Conjunctivae normal.  Neck:     Musculoskeletal: Normal range of motion.  Pulmonary:     Effort: Pulmonary effort is normal.  Abdominal:     Tenderness: There is no abdominal tenderness.  Genitourinary:    Exam position: Lithotomy position.     Vagina: Vaginal discharge present.     Comments: External vaginal area without lesions, rashes, mild erythema.  White/grayish discharge noted at vaginal opening.  Internal exam revealed discharge in vaginal vault.  Mild cervical friability.  White/grayish milky  discharge. No cervical motion tenderness. Musculoskeletal: Normal range of motion.  Lymphadenopathy:     Lower Body: Left inguinal adenopathy present.  Skin:    General: Skin is warm and dry.     Findings: No rash.  Neurological:     Mental Status: She is alert.      UC Treatments / Results  Labs (all labs ordered are listed, but only abnormal results are displayed) Labs Reviewed  POC URINE PREG, ED  POCT PREGNANCY, URINE  CERVICOVAGINAL ANCILLARY ONLY    EKG   Radiology No results found.  Procedures Procedures (including critical care time)  Medications Ordered in UC Medications  cefTRIAXone (ROCEPHIN) injection 250 mg (has no administration in time range)  azithromycin (ZITHROMAX) tablet 1,000 mg (has no administration in time range)  azithromycin (ZITHROMAX) 250 MG tablet (has no administration in time range)  cefTRIAXone (ROCEPHIN) 250 MG injection (has no administration in time range)    Initial Impression / Assessment and Plan / UC Course  I have reviewed the triage vital signs and the nursing notes.  Pertinent labs & imaging results that were available during my care of the patient were reviewed by me and considered in my medical decision making (see chart for details).     Vaginal discharge-treating prophylactically for gonorrhea, chlamydia and trichomonas.  Also treating for BV.  Flagyl sent to pharmacy. Recommended refraining from sexual activity until the infection is cleared. No cervical motion tenderness, no concern for PID. Pregnancy test negative. Contacts put on discharge instructions for OB/GYN follow-up for annual Pap smears and birth control management Final Clinical Impressions(s) / UC Diagnoses   Final diagnoses:  Vaginal discharge     Discharge Instructions     Treating you for STDs today.  Send medicine to the pharmacy to treat for bacterial vaginosis Please refrain from sexual activity until the infection is cleared. The swelling  in your groin area is a lymph node.  This should resolve after the infection is cleared. Your pregnancy test was negative I am putting contact on your discharge instructions for follow-up with OB/GYN for annual Pap smears and birth control management  ED Prescriptions    Medication Sig Dispense Auth. Provider   metroNIDAZOLE (FLAGYL) 500 MG tablet Take 1 tablet (500 mg total) by mouth 2 (two) times daily. 14 tablet Loura Halt A, NP     Controlled Substance Prescriptions [place] Controlled Substance Registry consulted? Not Applicable   Orvan July, NP 03/23/19 1051

## 2019-03-23 NOTE — Discharge Instructions (Signed)
Treating you for STDs today.  Send medicine to the pharmacy to treat for bacterial vaginosis Please refrain from sexual activity until the infection is cleared. The swelling in your groin area is a lymph node.  This should resolve after the infection is cleared. Your pregnancy test was negative I am putting contact on your discharge instructions for follow-up with OB/GYN for annual Pap smears and birth control management

## 2019-03-25 LAB — CERVICOVAGINAL ANCILLARY ONLY
Bacterial vaginitis: POSITIVE — AB
Candida vaginitis: POSITIVE — AB
Chlamydia: NEGATIVE
Neisseria Gonorrhea: NEGATIVE
Trichomonas: NEGATIVE

## 2019-03-26 ENCOUNTER — Telehealth (HOSPITAL_COMMUNITY): Payer: Self-pay | Admitting: Emergency Medicine

## 2019-03-26 MED ORDER — FLUCONAZOLE 150 MG PO TABS: 150.0000 mg | ORAL_TABLET | Freq: Once | ORAL | 0 refills | Status: AC

## 2019-03-26 NOTE — Telephone Encounter (Signed)
Test for candida (yeast) was positive.  Prescription for fluconazole 150mg po now, repeat dose in 3d if needed, #2 no refills, sent to the pharmacy of record.  Recheck or followup with PCP for further evaluation if symptoms are not improving.    Patient contacted and made aware of    results, all questions answered   

## 2019-04-19 ENCOUNTER — Encounter (HOSPITAL_COMMUNITY): Payer: Self-pay

## 2019-04-19 ENCOUNTER — Other Ambulatory Visit: Payer: Self-pay

## 2019-04-19 ENCOUNTER — Ambulatory Visit (HOSPITAL_COMMUNITY)
Admission: EM | Admit: 2019-04-19 | Discharge: 2019-04-19 | Disposition: A | Discharge status: Home / Self Care | Payer: Self-pay | Attending: Family Medicine | Admitting: Family Medicine

## 2019-04-19 DIAGNOSIS — Z202 Contact with and (suspected) exposure to infections with a predominantly sexual mode of transmission: Secondary | ICD-10-CM

## 2019-04-19 DIAGNOSIS — Z3202 Encounter for pregnancy test, result negative: Secondary | ICD-10-CM

## 2019-04-19 DIAGNOSIS — N898 Other specified noninflammatory disorders of vagina: Secondary | ICD-10-CM | POA: Insufficient documentation

## 2019-04-19 DIAGNOSIS — Z113 Encounter for screening for infections with a predominantly sexual mode of transmission: Secondary | ICD-10-CM | POA: Insufficient documentation

## 2019-04-19 LAB — POCT PREGNANCY, URINE: Preg Test, Ur: NEGATIVE

## 2019-04-19 MED ORDER — LIDOCAINE HCL (PF) 1 % IJ SOLN
INTRAMUSCULAR | Status: AC
  Filled 2019-04-19: qty 2

## 2019-04-19 MED ORDER — AZITHROMYCIN 250 MG PO TABS
ORAL_TABLET | ORAL | Status: AC
  Filled 2019-04-19: qty 4

## 2019-04-19 MED ORDER — FLUCONAZOLE 150 MG PO TABS: 150.0000 mg | ORAL_TABLET | Freq: Every day | ORAL | 0 refills | Status: DC

## 2019-04-19 MED ORDER — AZITHROMYCIN 250 MG PO TABS
1000.0000 mg | ORAL_TABLET | Freq: Once | ORAL | Status: AC
  Administered 2019-04-19: 1000 mg via ORAL

## 2019-04-19 MED ORDER — CEFTRIAXONE SODIUM 250 MG IJ SOLR
INTRAMUSCULAR | Status: AC
  Filled 2019-04-19: qty 250

## 2019-04-19 MED ORDER — CEFTRIAXONE SODIUM 250 MG IJ SOLR
250.0000 mg | Freq: Once | INTRAMUSCULAR | Status: AC
  Administered 2019-04-19: 13:00:00 250 mg via INTRAMUSCULAR

## 2019-04-19 NOTE — ED Triage Notes (Signed)
Patient presents to Urgent Care with complaints of vaginal irritation since yesterday and had discolored and thick vaginal discharge this morning. Patient reports she just started having intercourse with a new partner.

## 2019-04-19 NOTE — ED Provider Notes (Signed)
[facility]    CSN: [id number] Arrival date & time: 04/19/19  1054      History   Chief Complaint Chief Complaint  Patient presents with  . SEXUALLY TRANSMITTED DISEASE    HPI Angela Cortez is a 28 y.o. female.   Patient is a 28 year old female presents today with complaints of vaginal irritation, discharge.  Symptoms have been constant x1 day.  Reporting a discolored, thick discharge that was green in color this morning.  Reporting intercourse with a new partner.  She is concerned about STDs.  Reports that her partner also is having symptoms.  Patient recently treated for BV, yeast, gonorrhea and chlamydia at last visit here.  Denies any associated abdominal pain, back pain, pelvic pain, fevers, dysuria, hematuria or urinary frequency.Patient's last menstrual period was 03/26/2019 (approximate).  Patient also requesting pregnancy test.  ROS per HPI       Past Medical History:  Diagnosis Date  . No pertinent past medical history     Patient Active Problem List   Diagnosis Date Noted  . Limited prenatal care in third trimester 10/15/2016  . Mild tetrahydrocannabinol (THC) abuse 10/15/2016  . Limited prenatal care in second trimester 08/06/2016  . Supervision of normal pregnancy, antepartum 03/29/2016    Past Surgical History:  Procedure Laterality Date  . ACROMIO-CLAVICULAR JOINT REPAIR    . CLAVICLE SURGERY      OB History    Gravida  4   Para  2   Term  2   Preterm      AB  2   Living  2     SAB      TAB      Ectopic      Multiple  0   Live Births  1            Home Medications    Prior to Admission medications   Medication Sig Start Date End Date Taking? Authorizing Provider  fluconazole (DIFLUCAN) 150 MG tablet Take 1 tablet (150 mg total) by mouth daily. 04/19/19   Loura Halt A, NP  metroNIDAZOLE (FLAGYL) 500 MG tablet Take 1 tablet (500 mg total) by mouth 2 (two) times daily. 03/23/19   Orvan July, NP    Family  History Family History  Problem Relation Age of Onset  . Diabetes Father   . Hypertension Father   . Cancer Paternal Uncle   . Cancer Paternal Grandmother   . Asthma Mother     Social History Social History   Tobacco Use  . Smoking status: Current Every Day Smoker    Packs/day: 0.25    Types: Cigarettes    Last attempt to quit: 02/04/2016    Years since quitting: 3.2  . Smokeless tobacco: Never Used  Substance Use Topics  . Alcohol use: Yes    Comment: socially  . Drug use: No     Allergies   Patient has no known allergies.   Review of Systems Review of Systems   Physical Exam Triage Vital Signs ED Triage Vitals  Enc Vitals Group     BP 04/19/19 1203 120/75     Pulse --      Resp 04/19/19 1203 18     Temp 04/19/19 1203 98 F (36.7 C)     Temp Source 04/19/19 1203 Temporal     SpO2 04/19/19 1203 98 %     Weight --      Height --      Head  Circumference --      Peak Flow --      Pain Score 04/19/19 1200 2     Pain Loc --      Pain Edu? --      Excl. in [place]? --    No data found.  Updated Vital Signs BP 120/75 (BP Location: Right Arm)   Temp 98 F (36.7 C) (Temporal)   Resp 18   LMP 03/26/2019 (Approximate)   SpO2 98%   Visual Acuity Right Eye Distance:   Left Eye Distance:   Bilateral Distance:    Right Eye Near:   Left Eye Near:    Bilateral Near:     Physical Exam Vitals signs and nursing note reviewed.  Constitutional:      General: She is not in acute distress.    Appearance: Normal appearance. She is not ill-appearing, toxic-appearing or diaphoretic.  HENT:     Head: Normocephalic.     Nose: Nose normal.     Mouth/Throat:     Pharynx: Oropharynx is clear.  Eyes:     Conjunctiva/sclera: Conjunctivae normal.  Neck:     Musculoskeletal: Normal range of motion.  Pulmonary:     Effort: Pulmonary effort is normal.  Abdominal:     Palpations: Abdomen is soft.     Tenderness: There is no abdominal tenderness.  Musculoskeletal:  Normal range of motion.  Skin:    General: Skin is warm and dry.     Findings: No rash.  Neurological:     Mental Status: She is alert.  Psychiatric:        Mood and Affect: Mood normal.      UC Treatments / Results  Labs (all labs ordered are listed, but only abnormal results are displayed) Labs Reviewed  POC URINE PREG, ED  POCT PREGNANCY, URINE  CERVICOVAGINAL ANCILLARY ONLY    EKG   Radiology No results found.  Procedures Procedures (including critical care time)  Medications Ordered in UC Medications  azithromycin (ZITHROMAX) tablet 1,000 mg (1,000 mg Oral Given 04/19/19 1320)  cefTRIAXone (ROCEPHIN) injection 250 mg (250 mg Intramuscular Given 04/19/19 1319)  azithromycin (ZITHROMAX) 250 MG tablet (has no administration in time range)  cefTRIAXone (ROCEPHIN) 250 MG injection (has no administration in time range)  lidocaine (PF) (XYLOCAINE) 1 % injection (has no administration in time range)    Initial Impression / Assessment and Plan / UC Course  I have reviewed the triage vital signs and the nursing notes.  Pertinent labs & imaging results that were available during my care of the patient were reviewed by me and considered in my medical decision making (see chart for details).     Vaginal discharge-retreating patient today for gonorrhea and chlamydia based on exposure and symptoms. Also treating for yeast infection and sending swab for further testing Final Clinical Impressions(s) / UC Diagnoses   Final diagnoses:  Screening for STD (sexually transmitted disease)  Vaginal discharge     Discharge Instructions     Treating you for gonorrhea and chlamydia today based on symptoms and exposure. We will also send some Diflucan for yeast infection to the pharmacy Your pregnancy test was negative Swab sent for testing we will call with positive results   ED Prescriptions    Medication Sig Dispense Auth. Provider   fluconazole (DIFLUCAN) 150 MG tablet Take  1 tablet (150 mg total) by mouth daily. 2 tablet Loura Halt A, NP     Controlled Substance Prescriptions [place] Controlled Substance Registry consulted?  no   Orvan July, NP 04/19/19 1801

## 2019-04-19 NOTE — Discharge Instructions (Signed)
Treating you for gonorrhea and chlamydia today based on symptoms and exposure. We will also send some Diflucan for yeast infection to the pharmacy Your pregnancy test was negative Swab sent for testing we will call with positive results

## 2019-04-21 LAB — CERVICOVAGINAL ANCILLARY ONLY
Bacterial vaginitis: POSITIVE — AB
Candida vaginitis: POSITIVE — AB
Chlamydia: POSITIVE — AB
Neisseria Gonorrhea: NEGATIVE
Trichomonas: NEGATIVE

## 2019-04-22 ENCOUNTER — Telehealth (HOSPITAL_COMMUNITY): Payer: Self-pay | Admitting: Emergency Medicine

## 2019-04-22 MED ORDER — METRONIDAZOLE 500 MG PO TABS: 500.0000 mg | ORAL_TABLET | Freq: Two times a day (BID) | ORAL | 0 refills | Status: AC

## 2019-04-22 NOTE — Telephone Encounter (Signed)
Bacterial vaginosis is positive. This was not treated at the urgent care visit.  Flagyl 500 mg BID x 7 days #14 no refills sent to patients pharmacy of choice.    Candida (yeast) is positive.  Prescription for fluconazole was given at the urgent care visit.    Chlamydia is positive.  This was treated at the urgent care visit with po zithromax 1g.  Pt needs education to please refrain from sexual intercourse for 7 days to give the medicine time to work.  Sexual partners need to be notified and tested/treated.  Condoms may reduce risk of reinfection.  Recheck or followup with PCP for further evaluation if symptoms are not improving.  [facility] notified.  Patient contacted and made aware of    results, all questions answered

## 2019-08-04 ENCOUNTER — Encounter (HOSPITAL_COMMUNITY): Payer: Self-pay | Admitting: Emergency Medicine

## 2019-08-04 ENCOUNTER — Other Ambulatory Visit: Payer: Self-pay

## 2019-08-04 ENCOUNTER — Ambulatory Visit (HOSPITAL_COMMUNITY)
Admission: EM | Admit: 2019-08-04 | Discharge: 2019-08-04 | Disposition: A | Discharge status: Home / Self Care | Payer: Self-pay | Attending: Family Medicine | Admitting: Family Medicine

## 2019-08-04 DIAGNOSIS — N730 Acute parametritis and pelvic cellulitis: Secondary | ICD-10-CM | POA: Insufficient documentation

## 2019-08-04 DIAGNOSIS — Z3202 Encounter for pregnancy test, result negative: Secondary | ICD-10-CM

## 2019-08-04 LAB — POCT URINALYSIS DIP (DEVICE)
Bilirubin Urine: NEGATIVE
Glucose, UA: NEGATIVE mg/dL
Hgb urine dipstick: NEGATIVE
Ketones, ur: NEGATIVE mg/dL
Leukocytes,Ua: NEGATIVE
Nitrite: NEGATIVE
Protein, ur: NEGATIVE mg/dL
Specific Gravity, Urine: >=1.03 (ref 1.005–1.030)
Urobilinogen, UA: 0.2 mg/dL (ref 0.0–1.0)
pH: 5.5 (ref 5.0–8.0)

## 2019-08-04 LAB — POCT PREGNANCY, URINE: Preg Test, Ur: NEGATIVE

## 2019-08-04 LAB — POC URINE PREG, ED: Preg Test, Ur: NEGATIVE

## 2019-08-04 MED ORDER — LIDOCAINE HCL (PF) 1 % IJ SOLN
INTRAMUSCULAR | Status: AC
  Filled 2019-08-04: qty 2

## 2019-08-04 MED ORDER — CEFTRIAXONE SODIUM 1 G IJ SOLR
1.0000 g | Freq: Once | INTRAMUSCULAR | Status: AC
  Administered 2019-08-04: 1 g via INTRAMUSCULAR

## 2019-08-04 MED ORDER — AZITHROMYCIN 250 MG PO TABS
1000.0000 mg | ORAL_TABLET | Freq: Once | ORAL | Status: AC
  Administered 2019-08-04: 1000 mg via ORAL

## 2019-08-04 MED ORDER — AZITHROMYCIN 250 MG PO TABS
ORAL_TABLET | ORAL | Status: AC
  Filled 2019-08-04: qty 4

## 2019-08-04 MED ORDER — CIPROFLOXACIN HCL 500 MG PO TABS: 500.0000 mg | ORAL_TABLET | Freq: Two times a day (BID) | ORAL | 0 refills | Status: DC

## 2019-08-04 MED ORDER — CEFTRIAXONE SODIUM 1 G IJ SOLR
INTRAMUSCULAR | Status: AC
  Filled 2019-08-04: qty 10

## 2019-08-04 NOTE — ED Provider Notes (Signed)
[place]    CSN: [id number] Arrival date & time: 08/04/19  0820      History   Chief Complaint Chief Complaint  Patient presents with  . Abdominal Pain    HPI Angela Cortez is a 28 y.o. female.   [facility]ut[facility] urgent care patient  Patient reports abdominal cramping that started 07/22/2019.  This is after having a period on 12/5-12/9, and an abortion October.  No burning with urination.  Vaginal discharge thick and white.  Pain is located in lower abdomen.     Past Medical History:  Diagnosis Date  . No pertinent past medical history     Patient Active Problem List   Diagnosis Date Noted  . Limited prenatal care in third trimester 10/15/2016  . Mild tetrahydrocannabinol (THC) abuse 10/15/2016  . Limited prenatal care in second trimester 08/06/2016  . Supervision of normal pregnancy, antepartum 03/29/2016    Past Surgical History:  Procedure Laterality Date  . ACROMIO-CLAVICULAR JOINT REPAIR    . CLAVICLE SURGERY      OB History    Gravida  4   Para  2   Term  2   Preterm      AB  2   Living  2     SAB      TAB      Ectopic      Multiple  0   Live Births  1            Home Medications    Prior to Admission medications   Medication Sig Start Date End Date Taking? Authorizing Provider  ciprofloxacin (CIPRO) 500 MG tablet Take 1 tablet (500 mg total) by mouth 2 (two) times daily. 08/04/19   Robyn Haber, MD    Family History Family History  Problem Relation Age of Onset  . Diabetes Father   . Hypertension Father   . Cancer Paternal Uncle   . Cancer Paternal Grandmother   . Asthma Mother     Social History Social History   Tobacco Use  . Smoking status: Current Every Day Smoker    Packs/day: 0.25    Types: Cigarettes    Last attempt to quit: 02/04/2016    Years since quitting: 3.4  . Smokeless tobacco: Never Used  Substance Use Topics  . Alcohol use: Yes    Comment: socially  . Drug use:  No     Allergies   Patient has no known allergies.   Review of Systems Review of Systems  Constitutional: Negative for chills and fever.  Respiratory: Negative for cough.   Cardiovascular: Negative for chest pain.  Gastrointestinal: Positive for nausea. Negative for diarrhea and vomiting.  Genitourinary: Positive for pelvic pain.  Musculoskeletal: Positive for back pain.     Physical Exam Triage Vital Signs ED Triage Vitals  Enc Vitals Group     BP 08/04/19 0853 107/68     Pulse Rate 08/04/19 0853 73     Resp 08/04/19 0853 18     Temp 08/04/19 0853 98 F (36.7 C)     Temp Source 08/04/19 0853 Oral     SpO2 08/04/19 0853 100 %     Weight --      Height --      Head Circumference --      Peak Flow --      Pain Score 08/04/19 0849 6     Pain Loc --      Pain Edu? --  Excl. in [place]? --    No data found.  Updated Vital Signs BP 107/68 (BP Location: Left Arm)   Pulse 73   Temp 98 F (36.7 C) (Oral)   Resp 18   LMP 07/17/2019   SpO2 100%    Physical Exam Vitals and nursing note reviewed.  HENT:     Head: Normocephalic.  Eyes:     Extraocular Movements: Extraocular movements intact.  Cardiovascular:     Rate and Rhythm: Normal rate.  Pulmonary:     Effort: Pulmonary effort is normal.  Abdominal:     General: Abdomen is flat.     Palpations: Abdomen is soft.     Tenderness: There is abdominal tenderness in the right lower quadrant, suprapubic area and left lower quadrant. There is left CVA tenderness. There is no guarding or rebound. Negative signs include Murphy's sign.  Genitourinary:    Vagina: Vaginal discharge and tenderness present. No bleeding.     Cervix: Discharge present. No friability.     Uterus: Tender. Not enlarged.      Adnexa:        Right: Tenderness present. No fullness.         Left: Tenderness present. No fullness.    Skin:    General: Skin is warm and dry.  Neurological:     General: No focal deficit present.  Psychiatric:          Mood and Affect: Mood normal.      UC Treatments / Results  Labs (all labs ordered are listed, but only abnormal results are displayed) Labs Reviewed  POCT URINALYSIS DIP (DEVICE)  POCT PREGNANCY, URINE  POC URINE PREG, ED  CERVICOVAGINAL ANCILLARY ONLY    EKG   Radiology No results found.  Procedures Procedures (including critical care time)  Medications Ordered in UC Medications  azithromycin (ZITHROMAX) tablet 1,000 mg (has no administration in time range)  cefTRIAXone (ROCEPHIN) injection 1 g (has no administration in time range)    Initial Impression / Assessment and Plan / UC Course  I have reviewed the triage vital signs and the nursing notes.  Pertinent labs & imaging results that were available during my care of the patient were reviewed by me and considered in my medical decision making (see chart for details).    Final Clinical Impressions(s) / UC Diagnoses   Final diagnoses:  PID (acute pelvic inflammatory disease)   Discharge Instructions   None    ED Prescriptions    Medication Sig Dispense Auth. Provider   ciprofloxacin (CIPRO) 500 MG tablet Take 1 tablet (500 mg total) by mouth 2 (two) times daily. 14 tablet Robyn Haber, MD     I have reviewed the PDMP during this encounter.   Robyn Haber, MD 08/04/19 [phone]

## 2019-08-04 NOTE — ED Triage Notes (Signed)
Patient reports abdominal cramping that started 07/22/2019.  This is after having a period on 12/5-12/9, and an abortion October.  No burning with urination.  Vaginal discharge thick and white.

## 2019-08-10 LAB — CERVICOVAGINAL ANCILLARY ONLY
Bacterial vaginitis: POSITIVE — AB
Candida vaginitis: NEGATIVE
Chlamydia: POSITIVE — AB
Neisseria Gonorrhea: NEGATIVE
Trichomonas: NEGATIVE

## 2019-08-11 ENCOUNTER — Telehealth: Payer: Self-pay | Admitting: Emergency Medicine

## 2019-08-11 MED ORDER — METRONIDAZOLE 500 MG PO TABS: 500.0000 mg | ORAL_TABLET | Freq: Two times a day (BID) | ORAL | 0 refills | Status: AC

## 2019-08-11 NOTE — Telephone Encounter (Signed)
Chlamydia is positive.  This was treated at the urgent care visit with po zithromax 1g.  Pt needs education to please refrain from sexual intercourse for 7 days to give the medicine time to work.  Sexual partners need to be notified and tested/treated.  Condoms may reduce risk of reinfection.  Recheck or followup with PCP for further evaluation if symptoms are not improving.  [facility] notified.  Bacterial vaginosis is positive. This was not treated at the urgent care visit.  Flagyl 500 mg BID x 7 days #14 no refills sent to patients pharmacy of choice.    Attempted to reach patient. No answer at this time. Voicemail left.

## 2019-10-05 ENCOUNTER — Other Ambulatory Visit: Payer: Self-pay

## 2019-10-05 ENCOUNTER — Encounter (HOSPITAL_COMMUNITY): Payer: Self-pay | Admitting: Emergency Medicine

## 2019-10-05 ENCOUNTER — Ambulatory Visit (HOSPITAL_COMMUNITY)
Admission: EM | Admit: 2019-10-05 | Discharge: 2019-10-05 | Disposition: A | Discharge status: Home / Self Care | Payer: Self-pay | Attending: Internal Medicine | Admitting: Internal Medicine

## 2019-10-05 DIAGNOSIS — N898 Other specified noninflammatory disorders of vagina: Secondary | ICD-10-CM | POA: Insufficient documentation

## 2019-10-05 DIAGNOSIS — N9489 Other specified conditions associated with female genital organs and menstrual cycle: Secondary | ICD-10-CM | POA: Insufficient documentation

## 2019-10-05 DIAGNOSIS — B9689 Other specified bacterial agents as the cause of diseases classified elsewhere: Secondary | ICD-10-CM | POA: Insufficient documentation

## 2019-10-05 DIAGNOSIS — Z3202 Encounter for pregnancy test, result negative: Secondary | ICD-10-CM

## 2019-10-05 DIAGNOSIS — N760 Acute vaginitis: Secondary | ICD-10-CM | POA: Insufficient documentation

## 2019-10-05 LAB — POCT URINALYSIS DIP (DEVICE)
Bilirubin Urine: NEGATIVE
Glucose, UA: NEGATIVE mg/dL
Hgb urine dipstick: NEGATIVE
Ketones, ur: NEGATIVE mg/dL
Leukocytes,Ua: NEGATIVE
Nitrite: NEGATIVE
Protein, ur: NEGATIVE mg/dL
Specific Gravity, Urine: >=1.03 (ref 1.005–1.030)
Urobilinogen, UA: 0.2 mg/dL (ref 0.0–1.0)
pH: 6 (ref 5.0–8.0)

## 2019-10-05 LAB — POC URINE PREG, ED: Preg Test, Ur: NEGATIVE

## 2019-10-05 LAB — POCT PREGNANCY, URINE: Preg Test, Ur: NEGATIVE

## 2019-10-05 MED ORDER — METRONIDAZOLE 500 MG PO TABS: 500.0000 mg | ORAL_TABLET | Freq: Two times a day (BID) | ORAL | 0 refills | Status: DC

## 2019-10-05 MED ORDER — FLUCONAZOLE 150 MG PO TABS: 150.0000 mg | ORAL_TABLET | Freq: Once | ORAL | 0 refills | Status: AC

## 2019-10-05 MED ORDER — NAPROXEN 500 MG PO TABS: 500.0000 mg | ORAL_TABLET | Freq: Two times a day (BID) | ORAL | 0 refills | Status: DC

## 2019-10-05 NOTE — ED Provider Notes (Addendum)
[place]    CSN: [id number] Arrival date & time: 10/05/19  1854      History   Chief Complaint Chief Complaint  Patient presents with  . Abdominal Pain    HPI Angela Cortez is a 29 y.o. female.   Patient presents to urgent care for 3 weeks of lower abdominal cramping.  She also reports a white thin discharge. The cramping has been on a off.  She also had an irregular chunkier discharge after having sex with her boyfriend today.  Patient denied pain with sex or bleeding after sex. She denies vaginal irritation or vaginal pain.  Denies painful urination.  She did not note a specific odor to her discharge.  Last menstrual period was on September 13, 2019. She has not had vaginal bleeding since that time.  She is unsure whether she might be pregnant. She has been sexually active with only this female partner since her previous diagnosis.  Patient denies fever, chills, nausea, vomiting, diarrhea.  Of note she was diagnosed and treated for PID on 08/04/2019.  She had similar presenting symptoms with lower abdominal pain and severe lower back pain. Of which she is not reporting today, with regard to back pain.  She had complete symptom resolution after treatment.  She tested positive for chlamydia and bacterial vaginitis at that time.       Past Medical History:  Diagnosis Date  . No pertinent past medical history     Patient Active Problem List   Diagnosis Date Noted  . Limited prenatal care in third trimester 10/15/2016  . Mild tetrahydrocannabinol (THC) abuse 10/15/2016  . Limited prenatal care in second trimester 08/06/2016  . Supervision of normal pregnancy, antepartum 03/29/2016    Past Surgical History:  Procedure Laterality Date  . ACROMIO-CLAVICULAR JOINT REPAIR    . CLAVICLE SURGERY      OB History    Gravida  4   Para  2   Term  2   Preterm      AB  2   Living  2     SAB      TAB      Ectopic      Multiple  0   Live Births  1             Home Medications    Prior to Admission medications   Medication Sig Start Date End Date Taking? Authorizing Provider  ciprofloxacin (CIPRO) 500 MG tablet Take 1 tablet (500 mg total) by mouth 2 (two) times daily. 08/04/19   Robyn Haber, MD  fluconazole (DIFLUCAN) 150 MG tablet Take 1 tablet (150 mg total) by mouth once for 1 dose. 10/05/19 10/05/19  Obinna Ehresman, Marguerita Beards, PA-C  metroNIDAZOLE (FLAGYL) 500 MG tablet Take 1 tablet (500 mg total) by mouth 2 (two) times daily. 10/05/19   Amrit Erck, Marguerita Beards, PA-C  naproxen (NAPROSYN) 500 MG tablet Take 1 tablet (500 mg total) by mouth 2 (two) times daily. 10/05/19   Goerge Mohr, Marguerita Beards, PA-C    Family History Family History  Problem Relation Age of Onset  . Diabetes Father   . Hypertension Father   . Cancer Paternal Uncle   . Cancer Paternal Grandmother   . Asthma Mother     Social History Social History   Tobacco Use  . Smoking status: Current Every Day Smoker    Packs/day: 0.25    Types: Cigarettes    Last attempt to quit: 02/04/2016    Years since  quitting: 3.6  . Smokeless tobacco: Never Used  Substance Use Topics  . Alcohol use: Yes    Comment: socially  . Drug use: No     Allergies   Patient has no known allergies.   Review of Systems Review of Systems  Constitutional: Negative for chills and fever.  Genitourinary: Positive for pelvic pain and vaginal discharge. Negative for dysuria, flank pain, frequency, genital sores, hematuria, menstrual problem, vaginal bleeding and vaginal pain.  Musculoskeletal: Negative for arthralgias, back pain and myalgias.     Physical Exam Triage Vital Signs ED Triage Vitals  Enc Vitals Group     BP 10/05/19 1916 114/78     Pulse Rate 10/05/19 1916 90     Resp 10/05/19 1916 (!) 1     Temp 10/05/19 1916 98.4 F (36.9 C)     Temp Source 10/05/19 1916 Oral     SpO2 10/05/19 1916 99 %     Weight --      Height --      Head Circumference --      Peak Flow --      Pain Score  10/05/19 1913 3     Pain Loc --      Pain Edu? --      Excl. in [place]? --    No data found.  Updated Vital Signs BP 114/78 (BP Location: Right Arm)   Pulse 90   Temp 98.4 F (36.9 C) (Oral)   Resp 18   LMP 09/13/2019   SpO2 99%   Visual Acuity Right Eye Distance:   Left Eye Distance:   Bilateral Distance:    Right Eye Near:   Left Eye Near:    Bilateral Near:     Physical Exam Vitals and nursing note reviewed.  Constitutional:      General: She is not in acute distress.    Appearance: She is well-developed. She is obese. She is not ill-appearing.  HENT:     Head: Normocephalic and atraumatic.  Eyes:     Conjunctiva/sclera: Conjunctivae normal.  Cardiovascular:     Rate and Rhythm: Normal rate and regular rhythm.     Heart sounds: No murmur.  Pulmonary:     Effort: Pulmonary effort is normal. No respiratory distress.     Breath sounds: Normal breath sounds.  Abdominal:     General: Abdomen is flat.     Palpations: Abdomen is soft.     Tenderness: There is abdominal tenderness in the suprapubic area. There is no right CVA tenderness or left CVA tenderness.  Genitourinary:    Vagina: Vaginal discharge ( Thin white discharge with fishy-like odor noted.  Some curd-like white discharge also in vaginal vault) present. No erythema.     Cervix: No cervical motion tenderness or friability.     Uterus: Tender (Hard to touch).      Adnexa: Right adnexa normal and left adnexa normal.       Right: No tenderness.         Left: No tenderness.       Comments: Patient tolerated speculum and bimanual exam well despite uterine tenderness.  Uterus is firm on exam Musculoskeletal:     Cervical back: Neck supple.  Skin:    General: Skin is warm and dry.  Neurological:     General: No focal deficit present.     Mental Status: She is alert and oriented to person, place, and time.  Psychiatric:        Mood  and Affect: Mood normal.        Behavior: Behavior normal.      UC  Treatments / Results  Labs (all labs ordered are listed, but only abnormal results are displayed) Labs Reviewed  POCT URINALYSIS DIP (DEVICE)  POCT PREGNANCY, URINE  POC URINE PREG, ED  CERVICOVAGINAL ANCILLARY ONLY    EKG   Radiology No results found.  Procedures Procedures (including critical care time)  Medications Ordered in UC Medications - No data to display  Initial Impression / Assessment and Plan / UC Course  I have reviewed the triage vital signs and the nursing notes.  Pertinent labs & imaging results that were available during my care of the patient were reviewed by me and considered in my medical decision making (see chart for details).     #Bacterial vaginosis #Uterine cramping #Vaginal discharge Patient is a 29 year old past medical history of PID with chlamydia who presents today for uterine cramping and vaginal discharge.  Patient is afebrile and hemodynamically stable.  Given exam findings, vital sign stability, lack of recent painful sexual intercourse and 3 week duration of cramping lower suspicion for PID today - will treat for bacterial vaginosis today with metronidazole.  - We will send cervicovaginal swab to test for gonorrhea chlamydia and trichomonas.   -Discussed return precautions of developing fever or worsening pain and consideration to go to the emergency department if urgent care is closed.   -Given uterine firmness there is a possibility there may be a fibroid and prescribed naproxen twice daily.  Recommended patient follow-up with her GYN -Patient verbalizes understanding of plan and agrees.   Final Clinical Impressions(s) / UC Diagnoses   Final diagnoses:  Bacterial vaginosis  Vaginal discharge  Uterine cramping     Discharge Instructions     I want you to begin the metronidazole and take this twice a day for 7 days.  Also take the Diflucan at the end of the metronidazole treatment  I would like you to take the naproxen twice a  day  We have sent your swab and will notify you of any treatment changes.  I would also like for you [facility].  If you develop fever or worsening pain before we have talked about your results please return to urgent care.      ED Prescriptions    Medication Sig Dispense Auth. Provider   metroNIDAZOLE (FLAGYL) 500 MG tablet Take 1 tablet (500 mg total) by mouth 2 (two) times daily. 14 tablet Labrandon Knoch, Marguerita Beards, PA-C   fluconazole (DIFLUCAN) 150 MG tablet Take 1 tablet (150 mg total) by mouth once for 1 dose. 1 tablet Jabin Tapp, Marguerita Beards, PA-C   naproxen (NAPROSYN) 500 MG tablet Take 1 tablet (500 mg total) by mouth 2 (two) times daily. 30 tablet Shereese Bonnie, Marguerita Beards, PA-C     PDMP not reviewed this encounter.   Purnell Shoemaker, PA-C 10/05/19 2034    Yuliet Needs, Marguerita Beards, PA-C 10/05/19 2329

## 2019-10-05 NOTE — ED Triage Notes (Signed)
Abdominal cramping that started 3 weeks ago, "im not really sure"  Home pregnancy tests x 3 and negative.

## 2019-10-05 NOTE — Discharge Instructions (Signed)
I want you to begin the metronidazole and take this twice a day for 7 days.  Also take the Diflucan at the end of the metronidazole treatment  I would like you to take the naproxen twice a day  We have sent your swab and will notify you of any treatment changes.  I would also like for you to try to [facility] with [facility].  If you develop fever or worsening pain before we have talked about your results please return to urgent care.

## 2019-10-10 ENCOUNTER — Telehealth (HOSPITAL_COMMUNITY): Payer: Self-pay | Admitting: Physician Assistant

## 2019-10-10 NOTE — Telephone Encounter (Signed)
Encounter opened in error

## 2019-10-11 ENCOUNTER — Ambulatory Visit (HOSPITAL_COMMUNITY)
Admission: EM | Admit: 2019-10-11 | Discharge: 2019-10-11 | Disposition: A | Discharge status: Home / Self Care | Payer: Self-pay | Attending: Physician Assistant | Admitting: Physician Assistant

## 2019-10-11 DIAGNOSIS — B9689 Other specified bacterial agents as the cause of diseases classified elsewhere: Secondary | ICD-10-CM | POA: Insufficient documentation

## 2019-10-11 DIAGNOSIS — N760 Acute vaginitis: Secondary | ICD-10-CM | POA: Insufficient documentation

## 2019-10-11 LAB — CERVICOVAGINAL ANCILLARY ONLY
Bacterial vaginitis: POSITIVE — AB
Chlamydia: NEGATIVE
Neisseria Gonorrhea: NEGATIVE

## 2019-10-11 NOTE — ED Notes (Signed)
Pt returned for recollection from previous visit.

## 2019-10-13 LAB — CERVICOVAGINAL ANCILLARY ONLY
Bacterial vaginitis: POSITIVE — AB
Candida vaginitis: NEGATIVE
Chlamydia: NEGATIVE
Neisseria Gonorrhea: NEGATIVE
Trichomonas: NEGATIVE

## 2019-10-25 ENCOUNTER — Inpatient Hospital Stay (HOSPITAL_COMMUNITY)
Admission: AD | Admit: 2019-10-25 | Discharge: 2019-10-25 | Disposition: A | Discharge status: Home / Self Care | Payer: Self-pay | Attending: Obstetrics & Gynecology | Admitting: Obstetrics & Gynecology

## 2019-10-25 ENCOUNTER — Other Ambulatory Visit: Payer: Self-pay

## 2019-10-25 ENCOUNTER — Encounter (HOSPITAL_COMMUNITY): Payer: Self-pay | Admitting: Obstetrics & Gynecology

## 2019-10-25 DIAGNOSIS — O219 Vomiting of pregnancy, unspecified: Secondary | ICD-10-CM

## 2019-10-25 DIAGNOSIS — O99331 Smoking (tobacco) complicating pregnancy, first trimester: Secondary | ICD-10-CM | POA: Insufficient documentation

## 2019-10-25 DIAGNOSIS — F17210 Nicotine dependence, cigarettes, uncomplicated: Secondary | ICD-10-CM | POA: Insufficient documentation

## 2019-10-25 DIAGNOSIS — Z3201 Encounter for pregnancy test, result positive: Secondary | ICD-10-CM

## 2019-10-25 DIAGNOSIS — Z3A01 Less than 8 weeks gestation of pregnancy: Secondary | ICD-10-CM | POA: Insufficient documentation

## 2019-10-25 DIAGNOSIS — O210 Mild hyperemesis gravidarum: Secondary | ICD-10-CM | POA: Insufficient documentation

## 2019-10-25 LAB — POCT PREGNANCY, URINE: Preg Test, Ur: POSITIVE — AB

## 2019-10-25 LAB — URINALYSIS, ROUTINE W REFLEX MICROSCOPIC
Bilirubin Urine: NEGATIVE
Glucose, UA: NEGATIVE mg/dL
Hgb urine dipstick: NEGATIVE
Ketones, ur: NEGATIVE mg/dL
Leukocytes,Ua: NEGATIVE
Nitrite: NEGATIVE
Protein, ur: NEGATIVE mg/dL
Specific Gravity, Urine: 1.025 (ref 1.005–1.030)
pH: 6 (ref 5.0–8.0)

## 2019-10-25 MED ORDER — DOXYLAMINE-PYRIDOXINE 10-10 MG PO TBEC: DELAYED_RELEASE_TABLET | ORAL | 0 refills | Status: DC

## 2019-10-25 MED ORDER — PROMETHAZINE HCL 25 MG PO TABS: 25.0000 mg | ORAL_TABLET | Freq: Four times a day (QID) | ORAL | 1 refills | Status: DC | PRN

## 2019-10-25 NOTE — Discharge Instructions (Signed)
Nausea medication to take during pregnancy:  Unisom (doxylamine succinate 25 mg tablets) Take one tablet daily at bedtime. If symptoms are not adequately controlled, the dose can be increased to a maximum recommended dose of two tablets daily (1/2 tablet in the morning, 1/2 tablet mid-afternoon and one at bedtime). Vitamin B6 100mg  tablets. Take one tablet twice a day (up to 200 mg per day).        Morning Sickness  Morning sickness is when a woman feels nauseous during pregnancy. This nauseous feeling may or may not come with vomiting. It often occurs in the morning, but it can be a problem at any time of day. Morning sickness is most common during the first trimester. In some cases, it may continue throughout pregnancy. Although morning sickness is unpleasant, it is usually harmless unless the woman develops severe and continual vomiting (hyperemesis gravidarum), a condition that requires more intense treatment. What are the causes? The exact cause of this condition is not known, but it seems to be related to normal hormonal changes that occur in pregnancy. What increases the risk? You are more likely to develop this condition if:  You experienced nausea or vomiting before your pregnancy.  You had morning sickness during a previous pregnancy.  You are pregnant with more than one baby, such as twins. What are the signs or symptoms? Symptoms of this condition include:  Nausea.  Vomiting. How is this diagnosed? This condition is usually diagnosed based on your signs and symptoms. How is this treated? In many cases, treatment is not needed for this condition. Making some changes to what you eat may help to control symptoms. Your health care provider may also prescribe or recommend:  Vitamin B6 supplements.  Anti-nausea medicines.  Ginger. Follow these instructions at home: Medicines  Take over-the-counter and prescription medicines only as told by your health care provider. Do  not use any prescription, over-the-counter, or herbal medicines for morning sickness without first talking with your health care provider.  Taking multivitamins before getting pregnant can prevent or decrease the severity of morning sickness in most women. Eating and drinking  Eat a piece of dry toast or crackers before getting out of bed in the morning.  Eat 5 or 6 small meals a day.  Eat dry and bland foods, such as rice or a baked potato. Foods that are high in carbohydrates are often helpful.  Avoid greasy, fatty, and spicy foods.  Have someone cook for you if the smell of any food causes nausea and vomiting.  If you feel nauseous after taking prenatal vitamins, take the vitamins at night or with a snack.  Snack on protein foods between meals if you are hungry. Nuts, yogurt, and cheese are good options.  Drink fluids throughout the day.  Try ginger ale made with real ginger, ginger tea made from fresh grated ginger, or ginger candies. General instructions  Do not use any products that contain nicotine or tobacco, such as cigarettes and e-cigarettes. If you need help quitting, ask your health care provider.  Get an air purifier to keep the air in your house free of odors.  Get plenty of fresh air.  Try to avoid odors that trigger your nausea.  Consider trying these methods to help relieve symptoms: ? Wearing an acupressure wristband. These wristbands are often worn for seasickness. ? Acupuncture. Contact a health care provider if:  Your home remedies are not working and you need medicine.  You feel dizzy or light-headed.  You  are losing weight. Get help right away if:  You have persistent and uncontrolled nausea and vomiting.  You faint.  You have severe pain in your abdomen. Summary  Morning sickness is when a woman feels nauseous during pregnancy. This nauseous feeling may or may not come with vomiting.  Morning sickness is most common during the first  trimester.  It often occurs in the morning, but it can be a problem at any time of day.  In many cases, treatment is not needed for this condition. Making some changes to what you eat may help to control symptoms. This information is not intended to replace advice given to you by your health care provider. Make sure you discuss any questions you have with your health care provider. Document Revised: 07/11/2017 Document Reviewed: 08/31/2016 Elsevier Patient Education  2020 [employer].

## 2019-10-25 NOTE — MAU Note (Signed)
Angela Cortez is a 29 y.o. at [redacted]w[redacted]d here in [facility]U reporting: 2 + UPT yesterday. States she has been nauseous. No vomitting. Denies pain and bleeding. Was recently treated for BV at the end of February and chlamydia at the end of December.  LMP: 09/13/19  Onset of complaint: since last week  Pain score: 0/10  Vitals:   10/25/19 0921  BP: 106/67  Pulse: 92  Resp: 16  Temp: 98.1 F (36.7 C)  SpO2: 100%     Lab orders placed from triage: UA, UPT

## 2019-10-25 NOTE — MAU Provider Note (Signed)
Chief Complaint: Nausea   First Provider Initiated Contact with Patient 10/25/19 0946     SUBJECTIVE HPI: Angela Cortez is a 29 y.o. [id number] at [redacted]w[redacted]d who presents to Maternity Admissions reporting nausea. She had a positive pregnancy test yesterday. Has had nausea but no vomiting. Does not have antiemetic at home. Had 1 episode of diarrhea yesterday after eating [employer]. Denies abdominal pain, vaginal bleeding, fever, dysuria, or vaginal discharge. She was treated for for chlamydia in December & had negative testing a few weeks ago. Denies concern for reinfection. Plans on going to [facility] for prenatal care; she went there with her previous pregnancies.    Past Medical History:  Diagnosis Date  . No pertinent past medical history    OB History  Gravida Para Term Preterm AB Living  5 2 2   2 2   SAB TAB Ectopic Multiple Live Births        0 1    # Outcome Date GA Lbr Len/2nd Weight Sex Delivery Anes PTL Lv  5 Current           4 Term 10/27/16 [redacted]w[redacted]d 16:24 / 00:10 3090 g F Vag-Spont EPI  LIV     Birth Comments: wnl  3 Term 05/17/12 [redacted]w[redacted]d / 00:16 3570 g M Vag-Spont EPI    2 AB           1 AB            Past Surgical History:  Procedure Laterality Date  . ACROMIO-CLAVICULAR JOINT REPAIR     Social History   Socioeconomic History  . Marital status: Single    Spouse name: Not on file  . Number of children: Not on file  . Years of education: Not on file  . Highest education level: Not on file  Occupational History  . Not on file  Tobacco Use  . Smoking status: Current Every Day Smoker    Packs/day: 0.25    Types: Cigarettes    Last attempt to quit: 02/04/2016    Years since quitting: 3.7  . Smokeless tobacco: Never Used  Substance and Sexual Activity  . Alcohol use: Yes    Comment: socially  . Drug use: No  . Sexual activity: Yes    Birth control/protection: None  Other Topics Concern  . Not on file  Social History Narrative  . Not on file   Social Determinants  of Health   Financial Resource Strain:   . Difficulty of Paying Living Expenses:   Food Insecurity:   . Worried About Charity fundraiser in the Last Year:   . Arboriculturist in the Last Year:   Transportation Needs:   . Film/video editor (Medical):   Marland Kitchen Lack of Transportation (Non-Medical):   Physical Activity:   . Days of Exercise per Week:   . Minutes of Exercise per Session:   Stress:   . Feeling of Stress :   Social Connections:   . Frequency of Communication with Friends and Family:   . Frequency of Social Gatherings with Friends and Family:   . Attends Religious Services:   . Active Member of Clubs or Organizations:   . Attends Archivist Meetings:   Marland Kitchen Marital Status:   Intimate Partner Violence:   . Fear of Current or Ex-Partner:   . Emotionally Abused:   Marland Kitchen Physically Abused:   . Sexually Abused:    Family History  Problem Relation Age of Onset  . Diabetes  Father   . Hypertension Father   . Cancer Paternal Uncle   . Cancer Paternal Grandmother   . Asthma Mother    No current facility-administered medications on file prior to encounter.   No current outpatient medications on file prior to encounter.   No Known Allergies  I have reviewed patient's Past Medical Hx, Surgical Hx, Family Hx, Social Hx, medications and allergies.   Review of Systems  Constitutional: Negative.   Gastrointestinal: Positive for nausea. Negative for abdominal pain and vomiting.  Genitourinary: Negative.     OBJECTIVE Patient Vitals for the past 24 hrs:  BP Temp Temp src Pulse Resp SpO2 Height Weight  10/25/19 1016 108/68 - - 88 - - - -  10/25/19 0921 106/67 98.1 F (36.7 C) Oral 92 16 100 % - -  10/25/19 0915 - - - - - - 5\' 3"  (1.6 m) 73.3 kg   Constitutional: Well-developed, well-nourished female in no acute distress.  Cardiovascular: normal rate & rhythm, no murmur Respiratory: normal rate and effort. Lung sounds clear throughout GI: Abd soft, non-tender, Pos  BS x 4. No guarding or rebound tenderness MS: Extremities nontender, no edema, normal ROM Neurologic: Alert and oriented x 4.    LAB RESULTS Results for orders placed or performed during the hospital encounter of 10/25/19 (from the past 24 hour(s))  Urinalysis, Routine w reflex microscopic     Status: None   Collection Time: 10/25/19  9:11 AM  Result Value Ref Range   Color, Urine YELLOW YELLOW   APPearance CLEAR CLEAR   Specific Gravity, Urine 1.025 1.005 - 1.030   pH 6.0 5.0 - 8.0   Glucose, UA NEGATIVE NEGATIVE mg/dL   Hgb urine dipstick NEGATIVE NEGATIVE   Bilirubin Urine NEGATIVE NEGATIVE   Ketones, ur NEGATIVE NEGATIVE mg/dL   Protein, ur NEGATIVE NEGATIVE mg/dL   Nitrite NEGATIVE NEGATIVE   Leukocytes,Ua NEGATIVE NEGATIVE  Pregnancy, urine POC     Status: Abnormal   Collection Time: 10/25/19  9:12 AM  Result Value Ref Range   Preg Test, Ur POSITIVE (A) NEGATIVE    IMAGING No results found.  MAU COURSE Orders Placed This Encounter  Procedures  . US OB Comp Less 14 Wks  . Urinalysis, Routine w reflex microscopic  . Pregnancy, urine POC  . Discharge patient   Meds ordered this encounter  Medications  . Doxylamine-Pyridoxine 10-10 MG TBEC    Sig: Start with 2 tablets every evening. If symptoms persist, add 1 tablet every morning. If symptoms persist, add 1 tablet at mid-day.    Dispense:  90 tablet    Refill:  0    Order Specific Question:   Supervising Provider    Answer:   Verita Schneiders A [id number]  . promethazine (PHENERGAN) 25 MG tablet    Sig: Take 1 tablet (25 mg total) by mouth every 6 (six) hours as needed for nausea or vomiting.    Dispense:  30 tablet    Refill:  1    Order Specific Question:   Supervising Provider    Answer:   Verita Schneiders A [id number]    MDM UPT positive No abdominal pain or vaginal bleeding Declines antiemetic while in MAU. Will d/c home with rx diclegis & phenergan Plans on going to [facility]. Will order  outpatient ultrasound for viability.   ASSESSMENT 1. Nausea and vomiting during pregnancy prior to [redacted] weeks gestation   2. Positive pregnancy test     PLAN Discharge home in  stable condition. SAB precautions Rx diclegis & phenergan Outpatient ultrasound ordered - msg to [facility]) for ultrasound results appointment   [place] Follow up.   Why: the office will call you to schedule your appointments Contact information: [address] [place] [phone]         Allergies as of 10/25/2019   No Known Allergies     Medication List    STOP taking these medications   ciprofloxacin 500 MG tablet Commonly known as: Cipro   metroNIDAZOLE 500 MG tablet Commonly known as: FLAGYL   naproxen 500 MG tablet Commonly known as: NAPROSYN     TAKE these medications   Doxylamine-Pyridoxine 10-10 MG Tbec Start with 2 tablets every evening. If symptoms persist, add 1 tablet every morning. If symptoms persist, add 1 tablet at mid-day.   promethazine 25 MG tablet Commonly known as: PHENERGAN Take 1 tablet (25 mg total) by mouth every 6 (six) hours as needed for nausea or vomiting.        Jorje Guild, NP 10/25/2019  10:25 AM

## 2019-11-01 ENCOUNTER — Ambulatory Visit: Payer: Self-pay | Admitting: Obstetrics

## 2019-11-01 ENCOUNTER — Ambulatory Visit (HOSPITAL_COMMUNITY)
Admission: RE | Admit: 2019-11-01 | Discharge: 2019-11-01 | Disposition: A | Discharge status: Home / Self Care | Payer: Self-pay | Source: Ambulatory Visit | Attending: Student | Admitting: Student

## 2019-11-01 ENCOUNTER — Other Ambulatory Visit (HOSPITAL_COMMUNITY): Payer: Self-pay | Admitting: Student

## 2019-11-01 ENCOUNTER — Other Ambulatory Visit: Payer: Self-pay

## 2019-11-01 DIAGNOSIS — O219 Vomiting of pregnancy, unspecified: Secondary | ICD-10-CM | POA: Insufficient documentation

## 2019-11-01 DIAGNOSIS — Z3201 Encounter for pregnancy test, result positive: Secondary | ICD-10-CM

## 2019-11-03 ENCOUNTER — Encounter: Payer: Self-pay | Admitting: Obstetrics

## 2019-11-03 ENCOUNTER — Telehealth (INDEPENDENT_AMBULATORY_CARE_PROVIDER_SITE_OTHER): Payer: Self-pay | Admitting: Obstetrics

## 2019-11-03 DIAGNOSIS — Z3A01 Less than 8 weeks gestation of pregnancy: Secondary | ICD-10-CM

## 2019-11-03 DIAGNOSIS — O3680X0 Pregnancy with inconclusive fetal viability, not applicable or unspecified: Secondary | ICD-10-CM

## 2019-11-03 MED ORDER — PRENATE MINI 29-0.6-0.4-350 MG PO CAPS: 1.0000 | ORAL_CAPSULE | Freq: Every day | ORAL | 4 refills | Status: DC

## 2019-11-03 NOTE — Progress Notes (Signed)
Cramping and spotting pink

## 2019-11-03 NOTE — Progress Notes (Signed)
OBSTETRICS PRENATAL VIRTUAL VISIT ENCOUNTER NOTE  Provider location: [facility] [place] [facility] [place]   I connected with [name] on 11/03/19 at  9:00 AM EDT by MyChart Video Encounter at home and verified that I am speaking with the correct person using two identifiers.   I discussed the limitations, risks, security and privacy concerns of performing an evaluation and management service virtually and the availability of in person appointments. I also discussed with the patient that there may be a patient responsible charge related to this service. The patient expressed understanding and agreed to proceed. Subjective:  Angela Cortez is a 29 y.o. [id number] at [redacted]w[redacted]d being seen today for ongoing prenatal care.  She is currently monitored for the following issues for this low-risk pregnancy and has Supervision of normal pregnancy, antepartum; Limited prenatal care in second trimester; Limited prenatal care in third trimester; and Mild tetrahydrocannabinol (THC) abuse on their problem list.  Patient reports cramping and spotting over the past 2 weeks.   .  .   . Denies any leaking of fluid.   The following portions of the patient's history were reviewed and updated as appropriate: allergies, current medications, past family history, past medical history, past social history, past surgical history and problem list.   Objective:  There were no vitals filed for this visit.  Fetal Status:           General:  Alert, oriented and cooperative. Patient is in no acute distress.  Respiratory: Normal respiratory effort, no problems with respiration noted  Mental Status: Normal mood and affect. Normal behavior. Normal judgment and thought content.  Rest of physical exam deferred due to type of encounter  Imaging: US OB Comp Less 14 Wks  Result Date: 11/01/2019 CLINICAL DATA:  Fetal viability, positive urine pregnancy test. EXAM: OBSTETRIC <14 WK [place] AND TRANSVAGINAL OB US TECHNIQUE:  Both transabdominal and transvaginal ultrasound examinations were performed for complete evaluation of the gestation as well as the maternal uterus, adnexal regions, and pelvic cul-de-sac. Transvaginal technique was performed to assess early pregnancy. COMPARISON:  None. FINDINGS: Intrauterine gestational sac: Single Yolk sac:  Visualized. Embryo:  Not Visualized. Cardiac Activity: Not Visualized. MSD: 15.09 mm   6 w   2 d Subchorionic hemorrhage:  None visualized. Maternal uterus/adnexae: Right ovary appears normal. Probable corpus luteum cyst seen in left ovary. No free fluid is noted. IMPRESSION: Probable early intrauterine gestational sac with yolk sac, but no fetal pole or cardiac activity yet visualized. Recommend follow-up quantitative B-HCG levels and follow-up US in 14 days to assess viability. This recommendation follows SRU consensus guidelines: Diagnostic Criteria for Nonviable Pregnancy Early in the First Trimester. [place] Med [id number]. Electronically Signed   By: Marijo Conception M.D.   On: 11/01/2019 14:38   US OB Transvaginal  Result Date: 11/01/2019 CLINICAL DATA:  Fetal viability, positive urine pregnancy test. EXAM: OBSTETRIC <14 WK [place] AND TRANSVAGINAL OB US TECHNIQUE: Both transabdominal and transvaginal ultrasound examinations were performed for complete evaluation of the gestation as well as the maternal uterus, adnexal regions, and pelvic cul-de-sac. Transvaginal technique was performed to assess early pregnancy. COMPARISON:  None. FINDINGS: Intrauterine gestational sac: Single Yolk sac:  Visualized. Embryo:  Not Visualized. Cardiac Activity: Not Visualized. MSD: 15.09 mm   6 w   2 d Subchorionic hemorrhage:  None visualized. Maternal uterus/adnexae: Right ovary appears normal. Probable corpus luteum cyst seen in left ovary. No free fluid is noted. IMPRESSION: Probable early intrauterine  gestational sac with yolk sac, but no fetal pole or cardiac activity yet visualized.  Recommend follow-up quantitative B-HCG levels and follow-up US in 14 days to assess viability. This recommendation follows SRU consensus guidelines: Diagnostic Criteria for Nonviable Pregnancy Early in the First Trimester. [place] [employer] Med [id number]. Electronically Signed   By: Marijo Conception M.D.   On: 11/01/2019 14:38    Assessment and Plan:  Pregnancy: [id number] at [redacted]w[redacted]d 1. Less than [redacted] weeks gestation of pregnancy RX: - Prenat w/o A-FeCbn-Meth-FA-DHA (PRENATE MINI) 29-0.6-0.4-350 MG CAPS; Take 1 capsule by mouth daily before breakfast.  Dispense: 90 capsule; Refill: 4   Preterm labor symptoms and general obstetric precautions including but not limited to vaginal bleeding, contractions, leaking of fluid and fetal movement were reviewed in detail with the patient. I discussed the assessment and treatment plan with the patient. The patient was provided an opportunity to ask questions and all were answered. The patient agreed with the plan and demonstrated an understanding of the instructions. The patient was advised to call back or seek an in-person office evaluation/go to [facility]U at [facility] for any urgent or concerning symptoms. Please refer to After Visit Summary for other counseling recommendations.   I provided 15 minutes of face-to-face time during this encounter.  Return in about 4 weeks (around 12/01/2019) for NOB Visit.   Baltazar Najjar, MD [facility] for [facility], [place] [facility] 11/03/2019

## 2019-11-10 ENCOUNTER — Ambulatory Visit (HOSPITAL_COMMUNITY): Admission: RE | Admit: 2019-11-10 | Payer: Self-pay | Source: Ambulatory Visit

## 2019-11-10 ENCOUNTER — Telehealth (INDEPENDENT_AMBULATORY_CARE_PROVIDER_SITE_OTHER): Payer: Self-pay | Admitting: Obstetrics

## 2019-11-10 ENCOUNTER — Encounter: Payer: Self-pay | Admitting: Obstetrics

## 2019-11-10 DIAGNOSIS — O3680X0 Pregnancy with inconclusive fetal viability, not applicable or unspecified: Secondary | ICD-10-CM

## 2019-11-10 NOTE — Progress Notes (Signed)
F/U on U/S results.

## 2019-11-10 NOTE — Progress Notes (Signed)
TELEHEALTH VIRTUAL GYNECOLOGY VISIT ENCOUNTER NOTE  I connected with Angela Cortez on 11/10/19 at  4:00 PM EDT by telephone at home and verified that I am speaking with the correct person using two identifiers.   I discussed the limitations, risks, security and privacy concerns of performing an evaluation and management service by telephone and the availability of in person appointments. I also discussed with the patient that there may be a patient responsible charge related to this service. The patient expressed understanding and agreed to proceed.   History:  Angela Cortez is a 29 y.o. [phone] female being evaluated today for history of cramping and spotting.  Ultrasound done, and she presents today for results. She has had some mild cramping and spotting at night.     Past Medical History:  Diagnosis Date   No pertinent past medical history    Past Surgical History:  Procedure Laterality Date   ACROMIO-CLAVICULAR JOINT REPAIR     The following portions of the patient's history were reviewed and updated as appropriate: allergies, current medications, past family history, past medical history, past social history, past surgical history and problem list.    Review of Systems:  Pertinent items noted in HPI and remainder of comprehensive ROS otherwise negative.  Physical Exam:   General:  Alert, oriented and cooperative.   Mental Status: Normal mood and affect perceived. Normal judgment and thought content.  Physical exam deferred due to nature of the encounter  Labs and Imaging No results found for this or any previous visit (from the past 336 hour(s)). US OB Comp Less 14 Wks  Result Date: 11/01/2019 CLINICAL DATA:  Fetal viability, positive urine pregnancy test. EXAM: OBSTETRIC <14 WK [place] AND TRANSVAGINAL OB US TECHNIQUE: Both transabdominal and transvaginal ultrasound examinations were performed for complete evaluation of the gestation as well as the maternal uterus,  adnexal regions, and pelvic cul-de-sac. Transvaginal technique was performed to assess early pregnancy. COMPARISON:  None. FINDINGS: Intrauterine gestational sac: Single Yolk sac:  Visualized. Embryo:  Not Visualized. Cardiac Activity: Not Visualized. MSD: 15.09 mm   6 w   2 d Subchorionic hemorrhage:  None visualized. Maternal uterus/adnexae: Right ovary appears normal. Probable corpus luteum cyst seen in left ovary. No free fluid is noted. IMPRESSION: Probable early intrauterine gestational sac with yolk sac, but no fetal pole or cardiac activity yet visualized. Recommend follow-up quantitative B-HCG levels and follow-up US in 14 days to assess viability. This recommendation follows SRU consensus guidelines: Diagnostic Criteria for Nonviable Pregnancy Early in the First Trimester. [place] [employer] Med [id number]. Electronically Signed   By: Marijo Conception M.D.   On: 11/01/2019 14:38   US OB Transvaginal  Result Date: 11/01/2019 CLINICAL DATA:  Fetal viability, positive urine pregnancy test. EXAM: OBSTETRIC <14 WK [place] AND TRANSVAGINAL OB US TECHNIQUE: Both transabdominal and transvaginal ultrasound examinations were performed for complete evaluation of the gestation as well as the maternal uterus, adnexal regions, and pelvic cul-de-sac. Transvaginal technique was performed to assess early pregnancy. COMPARISON:  None. FINDINGS: Intrauterine gestational sac: Single Yolk sac:  Visualized. Embryo:  Not Visualized. Cardiac Activity: Not Visualized. MSD: 15.09 mm   6 w   2 d Subchorionic hemorrhage:  None visualized. Maternal uterus/adnexae: Right ovary appears normal. Probable corpus luteum cyst seen in left ovary. No free fluid is noted. IMPRESSION: Probable early intrauterine gestational sac with yolk sac, but no fetal pole or cardiac activity yet visualized. Recommend follow-up quantitative B-HCG levels and follow-up US  in 14 days to assess viability. This recommendation follows SRU consensus guidelines:  Diagnostic Criteria for Nonviable Pregnancy Early in the First Trimester. [place] [employer] Med [id number]. Electronically Signed   By: Marijo Conception M.D.   On: 11/01/2019 14:38      Assessment and Plan:     1. Encounter to determine fetal viability of pregnancy, single or unspecified fetus - ultrasound reveals 6 week early gestational sac and yolk sac but no fetal pole.   - recommend repeat ultrasound in 2 weeks  Rx: - US OB Follow Up; Future - US OB Transvaginal; Future      Follow up in 2 weeks.  MyChart.  I discussed the assessment and treatment plan with the patient. The patient was provided an opportunity to ask questions and all were answered. The patient agreed with the plan and demonstrated an understanding of the instructions.   The patient was advised to call back or seek an in-person evaluation/go to the ED if the symptoms worsen or if the condition fails to improve as anticipated.  I provided 15 minutes of non-face-to-face time during this encounter.   Angela Najjar, MD [facility] for [facility], [place] [facility] 11/10/2019

## 2019-11-24 ENCOUNTER — Telehealth: Payer: Self-pay | Admitting: Obstetrics & Gynecology

## 2019-11-24 ENCOUNTER — Ambulatory Visit (HOSPITAL_COMMUNITY): Admission: RE | Admit: 2019-11-24 | Payer: Self-pay | Source: Ambulatory Visit

## 2019-12-06 ENCOUNTER — Telehealth: Payer: Self-pay | Admitting: Obstetrics and Gynecology

## 2019-12-15 ENCOUNTER — Telehealth: Payer: Self-pay | Admitting: Obstetrics

## 2019-12-15 ENCOUNTER — Ambulatory Visit: Admission: RE | Admit: 2019-12-15 | Payer: Self-pay | Source: Ambulatory Visit

## 2020-01-13 ENCOUNTER — Ambulatory Visit (HOSPITAL_COMMUNITY)
Admission: EM | Admit: 2020-01-13 | Discharge: 2020-01-13 | Disposition: A | Discharge status: Home / Self Care | Payer: Self-pay | Attending: Internal Medicine | Admitting: Internal Medicine

## 2020-01-13 ENCOUNTER — Encounter (HOSPITAL_COMMUNITY): Payer: Self-pay

## 2020-01-13 ENCOUNTER — Other Ambulatory Visit: Payer: Self-pay

## 2020-01-13 DIAGNOSIS — N898 Other specified noninflammatory disorders of vagina: Secondary | ICD-10-CM | POA: Insufficient documentation

## 2020-01-13 DIAGNOSIS — N946 Dysmenorrhea, unspecified: Secondary | ICD-10-CM | POA: Insufficient documentation

## 2020-01-13 DIAGNOSIS — R1030 Lower abdominal pain, unspecified: Secondary | ICD-10-CM | POA: Insufficient documentation

## 2020-01-13 MED ORDER — IBUPROFEN 800 MG PO TABS: 800.0000 mg | ORAL_TABLET | Freq: Three times a day (TID) | ORAL | 0 refills | Status: DC | PRN

## 2020-01-13 NOTE — ED Triage Notes (Addendum)
Pt c/o lower abdominal pain and vaginal discharge x 1 week, hx of BV. Pt currently on period.

## 2020-01-13 NOTE — ED Provider Notes (Signed)
[place]   [id number] 01/13/20 Arrival Time: 0809   CC: VAGINAL DISCHARGE  SUBJECTIVE:  Angela Cortez is a 29 y.o. female who presents with complaints of abrupt/ gradual vaginal discharge that began 1 week ago. Also reports that she has been experiencing worsening menstrual cramping with this cycle. She denies a precipitating event, recent sexual encounter or recent antibiotic use. Patient is sexually active with 1 female partner. Describes discharge as thick white. She has not tried OTC medications for this. There are no aggravating symptoms. She reports similar symptoms in the past and was diagnosed with BV and treated with antibiotics. She denies fever, chills, nausea, vomiting, urinary symptoms, vaginal itching, vaginal odor, vaginal bleeding, dyspareunia, vaginal rashes or lesions.    Patient's last menstrual period was 12/06/2019.   ROS: As per HPI.  All other pertinent ROS negative.     Past Medical History:  Diagnosis Date  . No pertinent past medical history    Past Surgical History:  Procedure Laterality Date  . ACROMIO-CLAVICULAR JOINT REPAIR     No Known Allergies No current facility-administered medications on file prior to encounter.   No current outpatient medications on file prior to encounter.    Social History   Socioeconomic History  . Marital status: Single    Spouse name: Not on file  . Number of children: Not on file  . Years of education: Not on file  . Highest education level: Not on file  Occupational History  . Not on file  Tobacco Use  . Smoking status: Current Every Day Smoker    Packs/day: 0.25    Types: Cigarettes    Last attempt to quit: 02/04/2016    Years since quitting: 3.9  . Smokeless tobacco: Never Used  Substance and Sexual Activity  . Alcohol use: Yes    Comment: socially  . Drug use: No  . Sexual activity: Yes    Birth control/protection: None  Other Topics Concern  . Not on file  Social History Narrative    . Not on file   Social Determinants of Health   Financial Resource Strain:   . Difficulty of Paying Living Expenses:   Food Insecurity:   . Worried About Charity fundraiser in the Last Year:   . Arboriculturist in the Last Year:   Transportation Needs:   . Film/video editor (Medical):   Marland Kitchen Lack of Transportation (Non-Medical):   Physical Activity:   . Days of Exercise per Week:   . Minutes of Exercise per Session:   Stress:   . Feeling of Stress :   Social Connections:   . Frequency of Communication with Friends and Family:   . Frequency of Social Gatherings with Friends and Family:   . Attends Religious Services:   . Active Member of Clubs or Organizations:   . Attends Archivist Meetings:   Marland Kitchen Marital Status:   Intimate Partner Violence:   . Fear of Current or Ex-Partner:   . Emotionally Abused:   Marland Kitchen Physically Abused:   . Sexually Abused:    Family History  Problem Relation Age of Onset  . Diabetes Father   . Hypertension Father   . Cancer Paternal Uncle   . Cancer Paternal Grandmother   . Asthma Mother     OBJECTIVE:  Vitals:   01/13/20 0827  BP: 124/72  Pulse: 85  Resp: 18  Temp: 98 F (36.7 C)  SpO2: 100%     General  appearance: Alert, NAD, appears stated age Head: NCAT Throat: lips, mucosa, and tongue normal; teeth and gums normal Lungs: CTA bilaterally without adventitious breath sounds Heart: regular rate and rhythm.  Radial pulses 2+ symmetrical bilaterally Back: no CVA tenderness Abdomen: soft, non-tender; bowel sounds normal; no masses or organomegaly; no guarding or rebound tenderness GU: declines Skin: warm and dry Psychological:  Alert and cooperative. Normal mood and affect.  LABS:  Results for orders placed or performed during the hospital encounter of 10/25/19  Urinalysis, Routine w reflex microscopic  Result Value Ref Range   Color, Urine YELLOW YELLOW   APPearance CLEAR CLEAR   Specific Gravity, Urine 1.025 1.005 -  1.030   pH 6.0 5.0 - 8.0   Glucose, UA NEGATIVE NEGATIVE mg/dL   Hgb urine dipstick NEGATIVE NEGATIVE   Bilirubin Urine NEGATIVE NEGATIVE   Ketones, ur NEGATIVE NEGATIVE mg/dL   Protein, ur NEGATIVE NEGATIVE mg/dL   Nitrite NEGATIVE NEGATIVE   Leukocytes,Ua NEGATIVE NEGATIVE  Pregnancy, urine POC  Result Value Ref Range   Preg Test, Ur POSITIVE (A) NEGATIVE    Labs Reviewed  CERVICOVAGINAL ANCILLARY ONLY    ASSESSMENT & PLAN:  1. Vaginal discharge   2. Lower abdominal pain   3. Menstrual cramps     Meds ordered this encounter  Medications  . ibuprofen (ADVIL) 800 MG tablet    Sig: Take 1 tablet (800 mg total) by mouth every 8 (eight) hours as needed for moderate pain.    Dispense:  21 tablet    Refill:  0    Order Specific Question:   Supervising Provider    Answer:   Chase Picket [id number]    Pending: Labs Reviewed  CERVICOVAGINAL ANCILLARY ONLY   Menstrual Cramps Prescribed ibuprofen Follow up with this office or PCP as needed  Vaginal Discharge Vaginal self-swab obtained.   We will follow up with you regarding abnormal results Declines HIV/ syphilis testing today Take medications as prescribed and to completion If tests results are positive, please abstain from sexual activity until you and your partner(s) have been treated  Follow up with PCP or [place] if symptoms persists Return here or go to ER if you have any new or worsening symptoms fever, chills, nausea, vomiting, abdominal or pelvic pain, painful intercourse, vaginal discharge, vaginal bleeding, persistent symptoms despite treatment  Reviewed expectations re: course of current medical issues. Questions answered. Outlined signs and symptoms indicating need for more acute intervention. Patient verbalized understanding. After Visit Summary given.        Faustino Congress, NP 01/13/20 [phone]

## 2020-01-13 NOTE — Discharge Instructions (Signed)
Your STD tests are pending.  If your test results are positive, we will call you.  You may need additional treatment and your partner(s) may also need treatment.

## 2020-01-13 NOTE — ED Notes (Signed)
Urinalysis cancelled per VO Faustino Congress, NP - pt not be billed.

## 2020-01-14 LAB — CERVICOVAGINAL ANCILLARY ONLY
Bacterial Vaginitis (gardnerella): POSITIVE — AB
Candida Glabrata: NEGATIVE
Candida Vaginitis: NEGATIVE
Chlamydia: NEGATIVE
Comment: NEGATIVE
Comment: NEGATIVE
Comment: NEGATIVE
Comment: NEGATIVE
Comment: NEGATIVE
Comment: NORMAL
Neisseria Gonorrhea: NEGATIVE
Trichomonas: POSITIVE — AB

## 2020-01-15 ENCOUNTER — Telehealth (HOSPITAL_COMMUNITY): Payer: Self-pay | Admitting: Family Medicine

## 2020-01-15 ENCOUNTER — Telehealth (HOSPITAL_COMMUNITY): Payer: Self-pay | Admitting: Emergency Medicine

## 2020-01-15 MED ORDER — METRONIDAZOLE 500 MG PO TABS
500.0000 mg | ORAL_TABLET | Freq: Two times a day (BID) | ORAL | 0 refills | Status: DC
Start: 2020-01-15 — End: 2020-05-02

## 2020-01-15 NOTE — Telephone Encounter (Signed)
Called patient on phone number on file.  Had patient identify self using 2 identifiers.  Reviewed results and was informed script was sent to pharmacy that she had requested.  Asked if patient needed anything else, patient denied any further questions

## 2020-01-15 NOTE — Telephone Encounter (Signed)
Per RN. Patient called regarding + BV and Trich on recent testing. Desires tx. Sent to pharmacy on file.

## 2020-01-17 ENCOUNTER — Telehealth (HOSPITAL_COMMUNITY): Payer: Self-pay | Admitting: Orthopedic Surgery

## 2020-04-12 DIAGNOSIS — Z419 Encounter for procedure for purposes other than remedying health state, unspecified: Secondary | ICD-10-CM | POA: Diagnosis not present

## 2020-04-27 ENCOUNTER — Ambulatory Visit (INDEPENDENT_AMBULATORY_CARE_PROVIDER_SITE_OTHER): Payer: Medicaid Other

## 2020-04-27 ENCOUNTER — Other Ambulatory Visit: Payer: Self-pay

## 2020-04-27 ENCOUNTER — Other Ambulatory Visit (HOSPITAL_COMMUNITY)
Admission: RE | Admit: 2020-04-27 | Discharge: 2020-04-27 | Disposition: A | Discharge status: Home / Self Care | Payer: Medicaid Other | Source: Ambulatory Visit | Attending: Obstetrics | Admitting: Obstetrics

## 2020-04-27 DIAGNOSIS — A599 Trichomoniasis, unspecified: Secondary | ICD-10-CM

## 2020-04-27 DIAGNOSIS — Z3490 Encounter for supervision of normal pregnancy, unspecified, unspecified trimester: Secondary | ICD-10-CM | POA: Insufficient documentation

## 2020-04-27 DIAGNOSIS — N912 Amenorrhea, unspecified: Secondary | ICD-10-CM

## 2020-04-27 LAB — POCT URINE PREGNANCY: Preg Test, Ur: POSITIVE — AB

## 2020-04-27 MED ORDER — BLOOD PRESSURE MONITOR KIT: 1.0000 | PACK | 0 refills | Status: DC

## 2020-04-27 NOTE — Progress Notes (Signed)
Ms. Thornsberry presents today for UPT. She complains of nausea with vomiting for 7 days. LMP: 03/11/2020    OBJECTIVE: Appears well, in no apparent distress.  OB History    Gravida  5   Para  2   Term  2   Preterm      AB  2   Living  2     SAB      TAB      Ectopic      Multiple  0   Live Births  1          Home UPT Result: Positive x 5  In-Office UPT result: Positive  I have reviewed the patient's medical, obstetrical, social, and family histories, and medications.   ASSESSMENT: Positive pregnancy test  PLAN Prenatal care to be completed at: [facility]   B/P Cuff Sent  Possible BV infection  Pt had +TRICH and +BV on 01/13/20 . No TOC will do TOC today and await results. Pt voiced understanding.

## 2020-05-01 LAB — CERVICOVAGINAL ANCILLARY ONLY
Bacterial Vaginitis (gardnerella): POSITIVE — AB
Candida Glabrata: NEGATIVE
Candida Vaginitis: NEGATIVE
Chlamydia: NEGATIVE
Comment: NEGATIVE
Comment: NEGATIVE
Comment: NEGATIVE
Comment: NEGATIVE
Comment: NEGATIVE
Comment: NORMAL
Neisseria Gonorrhea: NEGATIVE
Trichomonas: NEGATIVE

## 2020-05-02 ENCOUNTER — Other Ambulatory Visit: Payer: Self-pay

## 2020-05-02 MED ORDER — METRONIDAZOLE 500 MG PO TABS: 500.0000 mg | ORAL_TABLET | Freq: Two times a day (BID) | ORAL | 0 refills | Status: DC

## 2020-05-02 MED ORDER — PROMETHAZINE HCL 25 MG PO TABS
25.0000 mg | ORAL_TABLET | Freq: Four times a day (QID) | ORAL | 0 refills | Status: DC | PRN
Start: 2020-05-02 — End: 2020-06-02

## 2020-05-02 NOTE — Progress Notes (Signed)
Pt called and reports increased N&V with pregnancy and she is not able to eat without feeling nauseous. Pt reports she is staying well hydrated and urinating appropriately. I advised pt I will send Rx Phenergan for nausea and vomiting per protocol to her pharmacy and for her to let us know if her symptoms worsen, pt voices understanding. Pt is also requesting Rx for BV she has not been treated for yet since doing self swab in office, advised pt I will send rx Flagyl to her pharmacy per protocol. Pt voices understanding.

## 2020-05-04 ENCOUNTER — Other Ambulatory Visit: Payer: Self-pay | Admitting: Obstetrics

## 2020-05-12 DIAGNOSIS — Z419 Encounter for procedure for purposes other than remedying health state, unspecified: Secondary | ICD-10-CM | POA: Diagnosis not present

## 2020-05-25 ENCOUNTER — Other Ambulatory Visit (HOSPITAL_COMMUNITY)
Admission: RE | Admit: 2020-05-25 | Discharge: 2020-05-25 | Disposition: A | Discharge status: Home / Self Care | Payer: Medicaid Other | Source: Ambulatory Visit

## 2020-05-25 ENCOUNTER — Ambulatory Visit (INDEPENDENT_AMBULATORY_CARE_PROVIDER_SITE_OTHER): Payer: Medicaid Other

## 2020-05-25 ENCOUNTER — Other Ambulatory Visit: Payer: Self-pay

## 2020-05-25 VITALS — BP 126/79 | HR 102 | Wt 170.0 lb

## 2020-05-25 DIAGNOSIS — Z3480 Encounter for supervision of other normal pregnancy, unspecified trimester: Secondary | ICD-10-CM

## 2020-05-25 DIAGNOSIS — Z124 Encounter for screening for malignant neoplasm of cervix: Secondary | ICD-10-CM | POA: Diagnosis not present

## 2020-05-25 DIAGNOSIS — O3680X0 Pregnancy with inconclusive fetal viability, not applicable or unspecified: Secondary | ICD-10-CM

## 2020-05-25 DIAGNOSIS — Z3A10 10 weeks gestation of pregnancy: Secondary | ICD-10-CM | POA: Diagnosis not present

## 2020-05-25 DIAGNOSIS — O99210 Obesity complicating pregnancy, unspecified trimester: Secondary | ICD-10-CM

## 2020-05-25 NOTE — Progress Notes (Signed)
Subjective:   Angela Cortez is a 29 y.o. H6P5916 at 32w5dby Definite LMP of March 11, 2020 being seen today for her first obstetrical visit.  She reports good mood with hearing heart rate and having an UKoreathat shows "baby flipping around in there!"    Gynecological/Obstetrical History: Her obstetrical history is significant for obesity. Patient does intend to breast feed. Pregnancy history fully reviewed. Patient reports decreased appetite.  She states she "doesn't really get hungry."  She reports she will eat only once a day and will snack (candy, fruit cup). She states she drinks lots of fluid (green tea, apple, orange, and pineapple juice) and usually feels full after this. She reports some N/V and states "I have morning sickness, but not too bad just when I get up." She reports having promethazine that she doesn't use, but is coping well.  Sexual Activity and Vaginal Concerns: She denies vaginal discharge, itching, burning, or bleeding as well as pain or discomfort during sex.   Medical History/ROS: She reports her blood pressure was high at her last appt.  She reports when she went home it was high also.  Patient denies history of HTN.  She denies other medical history. She reports some depression.  She states she has not seen anyone in the past for this and denies current depression. Patient denies urinary, constipation, or diarrhea concerns.    Social History: Patient denies history or current usage of tobacco, alcohol, or drugs. However, chart review reflective of MJ usage which patient admits.  Patient reports the FOB is IBary Richardwho is involved and supportive.  Patient reports that she lives with mom, sister, and 2 kids and endorses safety at home.  Patient denies DV/A. Patient is not currently employed.  HISTORY: OB History  Gravida Para Term Preterm AB Living  _0 0 2 2  SAB TAB Ectopic Multiple Live Births  0 0 0 0 1    # Outcome Date GA Lbr Len/2nd Weight Sex  Delivery Anes PTL Lv  6 Current           5 Term 10/27/16 375w6d6:24 / 00:10 6 lb 13 oz (3.09 kg) F Vag-Spont EPI  LIV     Birth Comments: wnl     Name: Garoutte,GIRL Shelie     Apgar1: 8  Apgar5: 9  4 Term 05/17/12 4023w5d00:16 7 lb 13.9 oz (3.57 kg) M Vag-Spont EPI       Name: Wolaver,BOY Myley  3 Gravida           2 AB           1 AB             Last pap smear was done today and is pending.  Past Medical History:  Diagnosis Date  . No pertinent past medical history    Past Surgical History:  Procedure Laterality Date  . ACROMIO-CLAVICULAR JOINT REPAIR     Family History  Problem Relation Age of Onset  . Diabetes Father   . Hypertension Father   . Cancer Paternal Uncle   . Cancer Paternal Grandmother   . Asthma Mother    Social History   Tobacco Use  . Smoking status: Current Every Day Smoker    Packs/day: 0.25    Types: Cigarettes    Last attempt to quit: 02/04/2016    Years since quitting: 4.3  . Smokeless tobacco: Never Used  Vaping Use  . Vaping  Use: Never used  Substance Use Topics  . Alcohol use: Not Currently    Comment: socially  . Drug use: No   No Known Allergies Current Outpatient Medications on File Prior to Visit  Medication Sig Dispense Refill  . Blood Pressure Monitor KIT 1 Device by Does not apply route once a week. To be monitored Regularly at home. (Patient not taking: Reported on 05/25/2020) 1 kit 0  . ibuprofen (ADVIL) 800 MG tablet Take 1 tablet (800 mg total) by mouth every 8 (eight) hours as needed for moderate pain. (Patient not taking: Reported on 04/27/2020) 21 tablet 0  . metroNIDAZOLE (FLAGYL) 500 MG tablet Take 1 tablet (500 mg total) by mouth 2 (two) times daily. (Patient not taking: Reported on 05/25/2020) 14 tablet 0  . promethazine (PHENERGAN) 25 MG tablet Take 1 tablet (25 mg total) by mouth every 6 (six) hours as needed for nausea or vomiting. (Patient not taking: Reported on 05/25/2020) 30 tablet 0   No current  facility-administered medications on file prior to visit.    Review of Systems Pertinent items noted in HPI and remainder of comprehensive ROS otherwise negative.  Exam   Vitals:   05/25/20 1344  BP: 126/79  Pulse: (!) 102  Weight: 170 lb (77.1 kg)   Fetal Heart Rate (bpm): 166 by u/s  Physical Exam Constitutional:      Appearance: Normal appearance.  Genitourinary:     No lesions in the vagina.     Vaginal rugosity present.     No vaginal discharge, erythema, tenderness, bleeding or atrophic mucosa.     Cervix is parous.     Cervical friability present.     No cervical motion tenderness, discharge, erythema, bleeding, polyp, nabothian cyst, eversion or pinkness.     Uterus is enlarged.     Uterus is not tender.     Genitourinary Comments: Pap collected with broom.  Cervix with bleeding after collection. CV collected BME: Uterine size c/w dates.  No apparent tenderness.    HENT:     Head: Normocephalic and atraumatic.  Eyes:     Conjunctiva/sclera: Conjunctivae normal.  Cardiovascular:     Rate and Rhythm: Normal rate and regular rhythm.     Heart sounds: Normal heart sounds.  Pulmonary:     Effort: Pulmonary effort is normal.     Breath sounds: Normal breath sounds.  Abdominal:     General: Abdomen is flat. Bowel sounds are normal.     Palpations: Abdomen is soft.     Tenderness: There is no abdominal tenderness.  Musculoskeletal:        General: Normal range of motion.     Cervical back: Normal range of motion. No rigidity.  Neurological:     Mental Status: She is alert and oriented to person, place, and time.  Skin:    General: Skin is warm and dry.  Psychiatric:        Mood and Affect: Mood normal.        Behavior: Behavior normal.        Thought Content: Thought content normal.  Vitals and nursing note reviewed. Exam conducted with a chaperone present.     Assessment:   29 y.o. year old G6P2022 Patient Active Problem List   Diagnosis Date Noted    . Supervision of normal pregnancy, antepartum 04/27/2020  . Limited prenatal care in third trimester 10/15/2016  . Mild tetrahydrocannabinol (THC) abuse 10/15/2016  . Limited prenatal care in second trimester 08/06/2016  Plan:  1.  Supervision of other normal pregnancy, antepartum -Congratulations given and patient welcomed to practice. -Discussed usage of Babyscripts and virtual visits as additional source of managing and completing PN visits in midst of coronavirus.   *Instructed to take blood pressure and record weekly into babyscripts. *Reviewed modified prenatal visit schedule and platforms used for virtual visits.  -Anticipatory guidance for prenatal visits including labs, ultrasounds, and testing; Initial labs drawn. -Genetic Screening discussed, First trimester screen, Quad screen and NIPS: ordered. -Encouraged to complete MyChart Registration for her ability to review results, send requests, and have questions addressed.  -Discussed estimated due date of May 7th. -Ultrasound discussed; fetal anatomic survey: ordered. -Continue PNV  -Influenza offered and declined. -Encouraged to seek out care at office or emergency room for urgent and/or emergent concerns. -Educated on the nature of [place] with multiple MDs and other Advanced Practice Providers was explained to patient; also emphasized that residents, students are part of our team. Informed of her right to refuse care as she deems appropriate.  -No questions or concerns.   2. [redacted] weeks gestation of pregnancy -Plan to perform AFP at [place] appt. -Next appt in 6 weeks as virtual appt.    3. Encounter to determine fetal viability of pregnancy, single or unspecified fetus -US performed to confirm IUP. -Results c/w dates.  4. Obesity in pregnancy -Reviewed BMI and diagnosis of obesity. -Discussed initiation of bASA daily to reduce risk of PreEclampsia in pregnancy and PPP. -Additional  labs ordered: CMP and HgB A1C  5. Screening for cervical cancer -Pap smear collected. -Discussed turnover time for results and frequency of screening.    Problem list reviewed and updated. Routine obstetric precautions reviewed.  Orders Placed This Encounter  Procedures  . US OB Limited    Standing Status:   Future    Number of Occurrences:   1    Standing Expiration Date:   05/25/2021    Order Specific Question:   Reason for Exam (SYMPTOM  OR DIAGNOSIS REQUIRED)    Answer:   DATING AND VIABILITY    Order Specific Question:   Preferred Imaging Location?    Answer:   Internal    Return in about 6 weeks (around 07/06/2020) for Virtual LR-ROB.   Maryann Conners, CNM 05/25/2020 2:11 PM

## 2020-05-25 NOTE — Progress Notes (Signed)
Pt presents for NOB provider visit.  NOB nurse intake completed 04/27/20 Pap due today  Flu vaccine offered, pt declined

## 2020-05-26 LAB — CBC/D/PLT+RPR+RH+ABO+RUB AB...
Antibody Screen: NEGATIVE
Basophils Absolute: 0 10*3/uL (ref 0.0–0.2)
Basos: 0 %
EOS (ABSOLUTE): 0.1 10*3/uL (ref 0.0–0.4)
Eos: 1 %
HCV Ab: <0.1 s/co ratio (ref 0.0–0.9)
HIV Screen 4th Generation wRfx: NONREACTIVE
Hematocrit: 35 % (ref 34.0–46.6)
Hemoglobin: 11.8 g/dL (ref 11.1–15.9)
Hepatitis B Surface Ag: NEGATIVE
Immature Grans (Abs): 0 10*3/uL (ref 0.0–0.1)
Immature Granulocytes: 0 %
Lymphocytes Absolute: 2.6 10*3/uL (ref 0.7–3.1)
Lymphs: 34 %
MCH: 31.1 pg (ref 26.6–33.0)
MCHC: 33.7 g/dL (ref 31.5–35.7)
MCV: 92 fL (ref 79–97)
Monocytes Absolute: 0.7 10*3/uL (ref 0.1–0.9)
Monocytes: 9 %
Neutrophils Absolute: 4.3 10*3/uL (ref 1.4–7.0)
Neutrophils: 56 %
Platelets: 279 10*3/uL (ref 150–450)
RBC: 3.8 x10E6/uL (ref 3.77–5.28)
RDW: 11.9 % (ref 11.7–15.4)
RPR Ser Ql: NONREACTIVE
Rh Factor: POSITIVE
Rubella Antibodies, IGG: 2.23 index (ref >0.99)
WBC: 7.6 10*3/uL (ref 3.4–10.8)

## 2020-05-26 LAB — COMPREHENSIVE METABOLIC PANEL
ALT: 24 IU/L (ref 0–32)
AST: 19 IU/L (ref 0–40)
Albumin/Globulin Ratio: 1.4 (ref 1.2–2.2)
Albumin: 4.3 g/dL (ref 3.9–5.0)
Alkaline Phosphatase: 58 IU/L (ref 44–121)
BUN/Creatinine Ratio: 8 — ABNORMAL LOW (ref 9–23)
BUN: 6 mg/dL (ref 6–20)
Bilirubin Total: 0.2 mg/dL (ref 0.0–1.2)
CO2: 24 mmol/L (ref 20–29)
Calcium: 9.3 mg/dL (ref 8.7–10.2)
Chloride: 100 mmol/L (ref 96–106)
Creatinine, Ser: 0.71 mg/dL (ref 0.57–1.00)
GFR calc Af Amer: 135 mL/min/{1.73_m2} (ref >59)
GFR calc non Af Amer: 117 mL/min/{1.73_m2} (ref >59)
Globulin, Total: 3 g/dL (ref 1.5–4.5)
Glucose: 77 mg/dL (ref 65–99)
Potassium: 3.7 mmol/L (ref 3.5–5.2)
Sodium: 134 mmol/L (ref 134–144)
Total Protein: 7.3 g/dL (ref 6.0–8.5)

## 2020-05-26 LAB — HEMOGLOBIN A1C
Est. average glucose Bld gHb Est-mCnc: 103 mg/dL
Hgb A1c MFr Bld: 5.2 % (ref 4.8–5.6)

## 2020-05-26 LAB — HCV INTERPRETATION

## 2020-05-27 LAB — URINE CULTURE, OB REFLEX

## 2020-05-27 LAB — CULTURE, OB URINE

## 2020-05-28 DIAGNOSIS — O99210 Obesity complicating pregnancy, unspecified trimester: Secondary | ICD-10-CM | POA: Insufficient documentation

## 2020-05-29 LAB — CERVICOVAGINAL ANCILLARY ONLY
Chlamydia: NEGATIVE
Comment: NEGATIVE
Comment: NEGATIVE
Comment: NORMAL
Neisseria Gonorrhea: NEGATIVE
Trichomonas: NEGATIVE

## 2020-05-31 DIAGNOSIS — R87610 Atypical squamous cells of undetermined significance on cytologic smear of cervix (ASC-US): Secondary | ICD-10-CM | POA: Insufficient documentation

## 2020-05-31 LAB — CYTOLOGY - PAP
Comment: NEGATIVE
Diagnosis: UNDETERMINED — AB
High risk HPV: NEGATIVE

## 2020-06-02 ENCOUNTER — Telehealth: Payer: Self-pay

## 2020-06-02 ENCOUNTER — Other Ambulatory Visit: Payer: Self-pay

## 2020-06-02 ENCOUNTER — Inpatient Hospital Stay (HOSPITAL_COMMUNITY)
Admission: AD | Admit: 2020-06-02 | Discharge: 2020-06-02 | Disposition: A | Discharge status: Home / Self Care | Payer: Medicaid Other | Attending: Obstetrics & Gynecology | Admitting: Obstetrics & Gynecology

## 2020-06-02 ENCOUNTER — Encounter (HOSPITAL_COMMUNITY): Payer: Self-pay | Admitting: Obstetrics & Gynecology

## 2020-06-02 DIAGNOSIS — N930 Postcoital and contact bleeding: Secondary | ICD-10-CM | POA: Insufficient documentation

## 2020-06-02 DIAGNOSIS — O209 Hemorrhage in early pregnancy, unspecified: Secondary | ICD-10-CM | POA: Diagnosis present

## 2020-06-02 DIAGNOSIS — Z3A11 11 weeks gestation of pregnancy: Secondary | ICD-10-CM | POA: Insufficient documentation

## 2020-06-02 DIAGNOSIS — Z87891 Personal history of nicotine dependence: Secondary | ICD-10-CM | POA: Insufficient documentation

## 2020-06-02 DIAGNOSIS — O26851 Spotting complicating pregnancy, first trimester: Secondary | ICD-10-CM

## 2020-06-02 DIAGNOSIS — Z3491 Encounter for supervision of normal pregnancy, unspecified, first trimester: Secondary | ICD-10-CM | POA: Diagnosis not present

## 2020-06-02 LAB — URINALYSIS, ROUTINE W REFLEX MICROSCOPIC
Bilirubin Urine: NEGATIVE
Glucose, UA: NEGATIVE mg/dL
Hgb urine dipstick: NEGATIVE
Ketones, ur: NEGATIVE mg/dL
Leukocytes,Ua: NEGATIVE
Nitrite: NEGATIVE
Protein, ur: NEGATIVE mg/dL
Specific Gravity, Urine: 1.026 (ref 1.005–1.030)
pH: 5 (ref 5.0–8.0)

## 2020-06-02 NOTE — Discharge Instructions (Signed)
Vaginal Bleeding During Pregnancy, First Trimester  A small amount of bleeding from the vagina (spotting) is relatively common during early pregnancy. It usually stops on its own. Various things may cause bleeding or spotting during early pregnancy. Some bleeding may be related to the pregnancy, and some may not. In many cases, the bleeding is normal and is not a problem. However, bleeding can also be a sign of something serious. Be sure to tell your health care provider about any vaginal bleeding right away. Some possible causes of vaginal bleeding during the first trimester include:  Infection or inflammation of the cervix.  Growths (polyps) on the cervix.  Miscarriage or threatened miscarriage.  Pregnancy tissue developing outside of the uterus (ectopic pregnancy).  A mass of tissue developing in the uterus due to an egg being fertilized incorrectly (molar pregnancy). Follow these instructions at home: Activity  Follow instructions from your health care provider about limiting your activity. Ask what activities are safe for you.  If needed, make plans for someone to help with your regular activities.  Do not have sex or orgasms until your health care provider says that this is safe. General instructions  Take over-the-counter and prescription medicines only as told by your health care provider.  Pay attention to any changes in your symptoms.  Do not use tampons or douche.  Write down how many pads you use each day, how often you change pads, and how soaked (saturated) they are.  If you pass any tissue from your vagina, save the tissue so you can show it to your health care provider.  Keep all follow-up visits as told by your health care provider. This is important. Contact a health care provider if:  You have vaginal bleeding during any part of your pregnancy.  You have cramps or labor pains.  You have a fever. Get help right away if:  You have severe cramps in your  back or abdomen.  You pass large clots or a large amount of tissue from your vagina.  Your bleeding increases.  You feel light-headed or weak, or you faint.  You have chills.  You are leaking fluid or have a gush of fluid from your vagina. Summary  A small amount of bleeding (spotting) from the vagina is relatively common during early pregnancy.  Various things may cause bleeding or spotting in early pregnancy.  Be sure to tell your health care provider about any vaginal bleeding right away. This information is not intended to replace advice given to you by your health care provider. Make sure you discuss any questions you have with your health care provider. Document Revised: 11/17/2018 Document Reviewed: 10/31/2016 Elsevier Patient Education  [place].

## 2020-06-02 NOTE — Telephone Encounter (Signed)
Return call to pt regarding vaginal bleeding since  last night. Recent intercourse maybe x 2 nights ago, no pain, spotting is continual. sPt made aware since we have no ava appts pt advised she may go to hospital for evaluation and assurance pt agreeable and voiced understanding.

## 2020-06-02 NOTE — MAU Provider Note (Signed)
History     CSN: [id number]  Arrival date and time: 06/02/20 6283   First Provider Initiated Contact with Patient 06/02/20 [phone]      Chief Complaint  Patient presents with  . Vaginal Bleeding   Angela Cortez is a 29 y.o. U7M5465 at 64w6dwho presents with vaginal bleeding. Symptoms started last night. Saw a streak of blood in her underwear and some on toilet paper throughout the night. As of this morning, blood is Fehl. Not saturating pads or passing blood clots. No abdominal pain, dysuria, or vaginal discharge. Last intercourse was 2 nights ago[facility] & had negative vaginal swabs.    OB History    Gravida  6   Para  2   Term  2   Preterm      AB  2   Living  2     SAB      TAB      Ectopic      Multiple  0   Live Births  1           Past Medical History:  Diagnosis Date  . No pertinent past medical history     Past Surgical History:  Procedure Laterality Date  . ACROMIO-CLAVICULAR JOINT REPAIR      Family History  Problem Relation Age of Onset  . Diabetes Father   . Hypertension Father   . Cancer Paternal Uncle   . Cancer Paternal Grandmother   . Asthma Mother     Social History   Tobacco Use  . Smoking status: Former Smoker    Packs/day: 0.25    Types: Cigarettes    Quit date: 02/04/2016    Years since quitting: 4.3  . Smokeless tobacco: Never Used  Vaping Use  . Vaping Use: Never used  Substance Use Topics  . Alcohol use: Not Currently    Comment: socially  . Drug use: No    Allergies: No Known Allergies  Medications Prior to Admission  Medication Sig Dispense Refill Last Dose  . Blood Pressure Monitor KIT 1 Device by Does not apply route once a week. To be monitored Regularly at home. (Patient not taking: Reported on 05/25/2020) 1 kit 0   . ibuprofen (ADVIL) 800 MG tablet Take 1 tablet (800 mg total) by mouth every 8 (eight) hours as needed for moderate pain. (Patient not taking:  Reported on 04/27/2020) 21 tablet 0   . metroNIDAZOLE (FLAGYL) 500 MG tablet Take 1 tablet (500 mg total) by mouth 2 (two) times daily. (Patient not taking: Reported on 05/25/2020) 14 tablet 0   . promethazine (PHENERGAN) 25 MG tablet Take 1 tablet (25 mg total) by mouth every 6 (six) hours as needed for nausea or vomiting. (Patient not taking: Reported on 05/25/2020) 30 tablet 0     Review of Systems  Constitutional: Negative.   Gastrointestinal: Negative.   Genitourinary: Positive for vaginal bleeding. Negative for dysuria and vaginal discharge.   Physical Exam   Blood pressure 106/69, pulse 84, temperature 98.4 F (36.9 C), temperature source Oral, resp. rate 16, height _0  (1.6 m), weight 77.6 kg, last menstrual period 03/11/2020, SpO2 100 %, unknown if currently breastfeeding.  Physical Exam Vitals and nursing note reviewed. Exam conducted with a chaperone present.  Constitutional:      General: She is not in acute distress.    Appearance: Normal appearance.  HENT:     Head: Normocephalic and atraumatic.  Pulmonary:  Effort: Pulmonary effort is normal. No respiratory distress.  Abdominal:     General: There is no distension.     Tenderness: There is no abdominal tenderness.  Genitourinary:    General: Normal vulva.     Exam position: Lithotomy position.     Vagina: Normal.     Cervix: No cervical motion tenderness or friability.     Comments: Cervix closed. No blood seen on exam.  Neurological:     Mental Status: She is alert.     MAU Course  Procedures Results for orders placed or performed during the hospital encounter of 06/02/20 (from the past 24 hour(s))  Urinalysis, Routine w reflex microscopic Urine, Clean Catch     Status: Abnormal   Collection Time: 06/02/20  9:29 AM  Result Value Ref Range   Color, Urine YELLOW YELLOW   APPearance HAZY (A) CLEAR   Specific Gravity, Urine 1.026 1.005 - 1.030   pH 5.0 5.0 - 8.0   Glucose, UA NEGATIVE NEGATIVE mg/dL    Hgb urine dipstick NEGATIVE NEGATIVE   Bilirubin Urine NEGATIVE NEGATIVE   Ketones, ur NEGATIVE NEGATIVE mg/dL   Protein, ur NEGATIVE NEGATIVE mg/dL   Nitrite NEGATIVE NEGATIVE   Leukocytes,Ua NEGATIVE NEGATIVE    MDM FHT present via doppler RH positive  Negative vaginal swabs last week in the office  Spec exam performed. No blood on exam, & cervix closed.   Patient concerned b/c difficult to obtain FHTs using doppler. BSUS performed for patient reassurance.   Pt informed that the ultrasound is considered a limited OB ultrasound and is not intended to be a complete ultrasound exam.  Patient also informed that the ultrasound is not being completed with the intent of assessing for fetal or placental anomalies or any pelvic abnormalities.  Explained that the purpose of today's ultrasound is to assess for  patient reassurance.  Patient acknowledges the purpose of the exam and the limitations of the study. Active IUP with +FCA    Assessment and Plan   1. PCB (post coital bleeding)  -no blood on exam. RH positive. Episode likely due to recent intercourse -reviewed bleeding precautions & reasons to return to MAU  2. [redacted] weeks gestation of pregnancy   3. Fetal heart tones present, first trimester      Jorje Guild 06/02/2020, 10:25 AM

## 2020-06-12 DIAGNOSIS — Z419 Encounter for procedure for purposes other than remedying health state, unspecified: Secondary | ICD-10-CM | POA: Diagnosis not present

## 2020-06-20 ENCOUNTER — Ambulatory Visit (HOSPITAL_COMMUNITY)
Admission: EM | Admit: 2020-06-20 | Discharge: 2020-06-20 | Disposition: A | Discharge status: Home / Self Care | Payer: Medicaid Other

## 2020-06-22 ENCOUNTER — Other Ambulatory Visit (HOSPITAL_COMMUNITY)
Admission: RE | Admit: 2020-06-22 | Discharge: 2020-06-22 | Disposition: A | Discharge status: Home / Self Care | Payer: Medicaid Other | Source: Ambulatory Visit | Attending: Obstetrics and Gynecology | Admitting: Obstetrics and Gynecology

## 2020-06-22 ENCOUNTER — Other Ambulatory Visit: Payer: Self-pay

## 2020-06-22 ENCOUNTER — Ambulatory Visit (INDEPENDENT_AMBULATORY_CARE_PROVIDER_SITE_OTHER): Payer: Medicaid Other | Admitting: Obstetrics and Gynecology

## 2020-06-22 VITALS — BP 115/73 | HR 85 | Wt 170.0 lb

## 2020-06-22 DIAGNOSIS — Z7189 Other specified counseling: Secondary | ICD-10-CM

## 2020-06-22 DIAGNOSIS — O99212 Obesity complicating pregnancy, second trimester: Secondary | ICD-10-CM

## 2020-06-22 DIAGNOSIS — O26892 Other specified pregnancy related conditions, second trimester: Secondary | ICD-10-CM

## 2020-06-22 DIAGNOSIS — O2692 Pregnancy related conditions, unspecified, second trimester: Secondary | ICD-10-CM

## 2020-06-22 DIAGNOSIS — N898 Other specified noninflammatory disorders of vagina: Secondary | ICD-10-CM | POA: Insufficient documentation

## 2020-06-22 DIAGNOSIS — E669 Obesity, unspecified: Secondary | ICD-10-CM

## 2020-06-22 DIAGNOSIS — Z3A14 14 weeks gestation of pregnancy: Secondary | ICD-10-CM

## 2020-06-22 DIAGNOSIS — O0932 Supervision of pregnancy with insufficient antenatal care, second trimester: Secondary | ICD-10-CM

## 2020-06-22 DIAGNOSIS — N760 Acute vaginitis: Secondary | ICD-10-CM

## 2020-06-22 DIAGNOSIS — R87610 Atypical squamous cells of undetermined significance on cytologic smear of cervix (ASC-US): Secondary | ICD-10-CM

## 2020-06-22 DIAGNOSIS — B9689 Other specified bacterial agents as the cause of diseases classified elsewhere: Secondary | ICD-10-CM

## 2020-06-22 LAB — POCT URINALYSIS DIPSTICK OB
Bilirubin, UA: NEGATIVE
Glucose, UA: NEGATIVE
Ketones, UA: NEGATIVE
POC,PROTEIN,UA: NEGATIVE
Spec Grav, UA: 1.02 (ref 1.010–1.025)
Urobilinogen, UA: 0.2 E.U./dL
pH, UA: 7.5 (ref 5.0–8.0)

## 2020-06-22 MED ORDER — TERCONAZOLE 0.4 % VA CREA: 1.0000 | TOPICAL_CREAM | Freq: Every day | VAGINAL | 0 refills | Status: DC

## 2020-06-22 NOTE — Progress Notes (Signed)
   PRENATAL VISIT NOTE  Subjective:  Angela Cortez is a 29 y.o. [phone] at [redacted]w[redacted]d being seen today for ongoing prenatal care.  She is currently monitored for the following issues for this low-risk pregnancy and has Limited prenatal care in second trimester; Limited prenatal care in third trimester; Mild tetrahydrocannabinol (THC) abuse; Supervision of normal pregnancy, antepartum; Obesity in pregnancy; and ASCUS of cervix with negative high risk HPV on their problem list.  Patient reports vaginal irritation.   .  .   . Denies leaking of fluid. Reports vaginal discharge; thick, white and very itchy. Patient self swabbed today. Reports partner was recently involved with another partner.   The following portions of the patient's history were reviewed and updated as appropriate: allergies, current medications, past family history, past medical history, past social history, past surgical history and problem list.   Objective:   Vitals:   06/22/20 0916  BP: 115/73  Pulse: 85  Weight: 170 lb (77.1 kg)    Fetal Status: Fetal Heart Rate (bpm): 140         General:  Alert, oriented and cooperative. Patient is in no acute distress.  Skin: Skin is warm and dry. No rash noted.   Cardiovascular: Normal heart rate noted  Respiratory: Normal respiratory effort, no problems with respiration noted  Abdomen: Soft, gravid, appropriate for gestational age.        Pelvic: Cervical exam deferred        Extremities: Normal range of motion.     Mental Status: Normal mood and affect. Normal behavior. Normal judgment and thought content.   Assessment and Plan:  Pregnancy: H2C9470 at [redacted]w[redacted]d 1. Vaginal itching  - POC Urinalysis Dipstick OB - Cervicovaginal ancillary only( [place]) - Doing well - Nausea has resolved   COVID-19 Vaccine Counseling: The patient was counseled on the potential benefits and lack of known risks of COVID vaccination, during pregnancy and breastfeeding, during today's  visit. The patient's questions and concerns were addressed today, including safety of the vaccination and potential side effects as they have been published by ACOG and SM[facility]. The patient has been informed that there have not been any documented vaccine related injuries, deaths or birth defects to infant or mom after receiving the COVID-19 vaccine to date. The patient has been made aware that although she is not at increased risk of contracting COVID-19 during pregnancy, she is at increased risk of developing severe disease and complications if she contracts COVID-19 while pregnant. All patient questions were addressed during our visit today. The patient is still unsure of her decision for vaccination.     Preterm labor symptoms and general obstetric precautions including but not limited to vaginal bleeding, contractions, leaking of fluid and fetal movement were reviewed in detail with the patient. Please refer to After Visit Summary for other counseling recommendations.   No follow-ups on file.  Future Appointments  Date Time Provider [place]  07/05/2020 10:15 AM Virginia Rochester, NP [facility] None  07/24/2020 10:15 AM [facility] NURSE [facility]  07/24/2020 10:30 AM [facility] US3 [facility]    Noni Saupe, NP

## 2020-06-22 NOTE — Progress Notes (Signed)
exposure to std, having thick white discharge. Performing self collect

## 2020-06-23 LAB — CERVICOVAGINAL ANCILLARY ONLY
Bacterial Vaginitis (gardnerella): NEGATIVE
Candida Glabrata: NEGATIVE
Candida Vaginitis: POSITIVE — AB
Chlamydia: NEGATIVE
Comment: NEGATIVE
Comment: NEGATIVE
Comment: NEGATIVE
Comment: NEGATIVE
Comment: NEGATIVE
Comment: NORMAL
Neisseria Gonorrhea: NEGATIVE
Trichomonas: NEGATIVE

## 2020-07-04 ENCOUNTER — Encounter: Payer: Self-pay | Admitting: Obstetrics

## 2020-07-05 ENCOUNTER — Telehealth (INDEPENDENT_AMBULATORY_CARE_PROVIDER_SITE_OTHER): Payer: Medicaid Other | Admitting: Nurse Practitioner

## 2020-07-05 ENCOUNTER — Encounter: Payer: Self-pay | Admitting: Nurse Practitioner

## 2020-07-05 VITALS — BP 126/87 | HR 119

## 2020-07-05 DIAGNOSIS — O99612 Diseases of the digestive system complicating pregnancy, second trimester: Secondary | ICD-10-CM

## 2020-07-05 DIAGNOSIS — K117 Disturbances of salivary secretion: Secondary | ICD-10-CM

## 2020-07-05 DIAGNOSIS — Z3A16 16 weeks gestation of pregnancy: Secondary | ICD-10-CM

## 2020-07-05 DIAGNOSIS — F17200 Nicotine dependence, unspecified, uncomplicated: Secondary | ICD-10-CM

## 2020-07-05 DIAGNOSIS — Z3480 Encounter for supervision of other normal pregnancy, unspecified trimester: Secondary | ICD-10-CM

## 2020-07-05 DIAGNOSIS — O99322 Drug use complicating pregnancy, second trimester: Secondary | ICD-10-CM

## 2020-07-05 DIAGNOSIS — F1290 Cannabis use, unspecified, uncomplicated: Secondary | ICD-10-CM

## 2020-07-05 MED ORDER — GLYCOPYRROLATE 2 MG PO TABS: 2.0000 mg | ORAL_TABLET | Freq: Three times a day (TID) | ORAL | 0 refills | Status: DC

## 2020-07-05 NOTE — Progress Notes (Signed)
OBSTETRICS PRENATAL VIRTUAL VISIT ENCOUNTER NOTE  Provider location: [facility] [place] [facility] [place]   I connected with [name] on 07/05/20 at 10:15 AM EST by MyChart Video Encounter at home and verified that I am speaking with the correct person using two identifiers.   I discussed the limitations, risks, security and privacy concerns of performing an evaluation and management service virtually and the availability of in person appointments. I also discussed with the patient that there may be a patient responsible charge related to this service. The patient expressed understanding and agreed to proceed. Subjective:  Angela Cortez is a 29 y.o. [id number] at [redacted]w[redacted]d being seen today for ongoing prenatal care.  She is currently monitored for the following issues for this low-risk pregnancy and has Mild tetrahydrocannabinol (THC) abuse; Supervision of normal pregnancy, antepartum; Obesity in pregnancy; and ASCUS of cervix with negative high risk HPV on their problem list.  Patient reports no complaints.  Contractions: Not present. Vag. Bleeding: None.  Movement: Present. Denies any leaking of fluid.   The following portions of the patient's history were reviewed and updated as appropriate: allergies, current medications, past family history, past medical history, past social history, past surgical history and problem list.   Objective:   Vitals:   07/05/20 1013  BP: 126/87  Pulse: (!) 119    Fetal Status:     Movement: Present     General:  Alert, oriented and cooperative. Patient is in no acute distress.  Respiratory: Normal respiratory effort, no problems with respiration noted  Mental Status: Normal mood and affect. Normal behavior. Normal judgment and thought content.  Rest of physical exam deferred due to type of encounter  Imaging: No results found.  Assessment and Plan:  Pregnancy: [id number] at [redacted]w[redacted]d 1. Supervision of other normal pregnancy,  antepartum Difficult connection on Mychart today - took 3 tries to get client at the visit - nurse had to call again, then no audio from client - had to go out and enter video room again and then audio from provider lost before visit was completely finished.  Went out and came back but unable to hear provider.  Ended visit as we were close to finishing. Does not plan to get Covid vaccine - does not think she will get sick - does not go out and cleans house well.  Unable to discuss further due to connection problems today. Will schedule lab visit for AFP next week  - AFP, Serum, Open Spina Bifida; Future  2. Ptyalism Has spitting daily and mucus causing gagging every morning - no vomiting Uses marijuana to stimulate appetite  - advised to stop marijuana as it is not helping the pregnancy Prescribed Robinul to try to decrease secretions  3. Smokes Marijuana approx 3 times a day, not eating well - mostly fluids but sometimes a burger  Preterm labor symptoms and general obstetric precautions including but not limited to vaginal bleeding, contractions, leaking of fluid and fetal movement were reviewed in detail with the patient. I discussed the assessment and treatment plan with the patient. The patient was provided an opportunity to ask questions and all were answered. The patient agreed with the plan and demonstrated an understanding of the instructions. The patient was advised to call back or seek an in-person office evaluation/go to MAU at [facility] for any urgent or concerning symptoms. Please refer to After Visit Summary for other counseling recommendations.   I provided 8 minutes of face-to-face time  during this encounter.  Return in about 4 weeks (around 08/02/2020) for in person ROB.  Future Appointments  Date Time Provider [place]  07/24/2020 10:15 AM [facility] NURSE [facility]  07/24/2020 10:30 AM [facility] US3 [facility] [place], NP [facility] [place]

## 2020-07-05 NOTE — Progress Notes (Signed)
S/w pt for virtual visit. Pt reports feeling fetal flutters, with occasional pressure.

## 2020-07-12 DIAGNOSIS — Z419 Encounter for procedure for purposes other than remedying health state, unspecified: Secondary | ICD-10-CM | POA: Diagnosis not present

## 2020-07-24 ENCOUNTER — Ambulatory Visit: Payer: Medicaid Other | Admitting: *Deleted

## 2020-07-24 ENCOUNTER — Encounter: Payer: Self-pay | Admitting: *Deleted

## 2020-07-24 ENCOUNTER — Ambulatory Visit: Payer: Medicaid Other

## 2020-07-24 ENCOUNTER — Other Ambulatory Visit: Payer: Self-pay | Admitting: *Deleted

## 2020-07-24 ENCOUNTER — Telehealth: Payer: Self-pay

## 2020-07-24 ENCOUNTER — Other Ambulatory Visit: Payer: Self-pay

## 2020-07-24 VITALS — BP 116/66 | HR 108

## 2020-07-24 DIAGNOSIS — O10912 Unspecified pre-existing hypertension complicating pregnancy, second trimester: Secondary | ICD-10-CM

## 2020-07-24 DIAGNOSIS — Z362 Encounter for other antenatal screening follow-up: Secondary | ICD-10-CM

## 2020-07-24 DIAGNOSIS — Z3480 Encounter for supervision of other normal pregnancy, unspecified trimester: Secondary | ICD-10-CM

## 2020-07-24 DIAGNOSIS — Z363 Encounter for antenatal screening for malformations: Secondary | ICD-10-CM | POA: Insufficient documentation

## 2020-07-24 DIAGNOSIS — Z3A19 19 weeks gestation of pregnancy: Secondary | ICD-10-CM | POA: Insufficient documentation

## 2020-07-24 NOTE — Telephone Encounter (Signed)
Patient called at 10:11am went to [facility] because she thought the ultrasound appointment was there.  Called to let us know she was on her way here from [facility].  Told the patient she had until 10:30am to get here and get checked in.

## 2020-08-09 ENCOUNTER — Encounter (HOSPITAL_COMMUNITY): Payer: Self-pay | Admitting: Family Medicine

## 2020-08-09 ENCOUNTER — Other Ambulatory Visit: Payer: Self-pay

## 2020-08-09 ENCOUNTER — Inpatient Hospital Stay (HOSPITAL_COMMUNITY)
Admission: EM | Admit: 2020-08-09 | Discharge: 2020-08-09 | Disposition: A | Discharge status: Home / Self Care | Payer: Medicaid Other | Attending: Family Medicine | Admitting: Family Medicine

## 2020-08-09 DIAGNOSIS — U071 COVID-19: Secondary | ICD-10-CM | POA: Insufficient documentation

## 2020-08-09 DIAGNOSIS — O132 Gestational [pregnancy-induced] hypertension without significant proteinuria, second trimester: Secondary | ICD-10-CM | POA: Insufficient documentation

## 2020-08-09 DIAGNOSIS — Z87891 Personal history of nicotine dependence: Secondary | ICD-10-CM | POA: Diagnosis not present

## 2020-08-09 DIAGNOSIS — O98512 Other viral diseases complicating pregnancy, second trimester: Secondary | ICD-10-CM | POA: Diagnosis not present

## 2020-08-09 DIAGNOSIS — Z3A21 21 weeks gestation of pregnancy: Secondary | ICD-10-CM | POA: Insufficient documentation

## 2020-08-09 DIAGNOSIS — O26892 Other specified pregnancy related conditions, second trimester: Secondary | ICD-10-CM | POA: Diagnosis present

## 2020-08-09 LAB — URINALYSIS, ROUTINE W REFLEX MICROSCOPIC
Bilirubin Urine: NEGATIVE
Glucose, UA: NEGATIVE mg/dL
Hgb urine dipstick: NEGATIVE
Ketones, ur: 20 mg/dL — AB
Leukocytes,Ua: NEGATIVE
Nitrite: NEGATIVE
Protein, ur: 30 mg/dL — AB
Specific Gravity, Urine: 1.026 (ref 1.005–1.030)
pH: 7 (ref 5.0–8.0)

## 2020-08-09 LAB — RESP PANEL BY RT-PCR (FLU A&B, COVID) ARPGX2
Influenza A by PCR: NEGATIVE
Influenza B by PCR: NEGATIVE
SARS Coronavirus 2 by RT PCR: POSITIVE — AB

## 2020-08-09 MED ORDER — FAMOTIDINE 20 MG IN NS 100 ML IVPB
20.0000 mg | Freq: Once | INTRAVENOUS | Status: AC
  Administered 2020-08-09: 09:00:00 20 mg via INTRAVENOUS
  Filled 2020-08-09: qty 100

## 2020-08-09 MED ORDER — PROMETHAZINE HCL 25 MG/ML IJ SOLN
25.0000 mg | Freq: Once | INTRAVENOUS | Status: AC
  Administered 2020-08-09: 09:00:00 25 mg via INTRAVENOUS
  Filled 2020-08-09: qty 1

## 2020-08-09 MED ORDER — PROMETHAZINE HCL 12.5 MG PO TABS: 12.5000 mg | ORAL_TABLET | Freq: Four times a day (QID) | ORAL | 0 refills | Status: DC | PRN

## 2020-08-09 MED ORDER — FAMOTIDINE IN NACL 20-0.9 MG/50ML-% IV SOLN
20.0000 mg | Freq: Once | INTRAVENOUS | Status: DC
  Filled 2020-08-09: qty 50

## 2020-08-09 MED ORDER — M.V.I. ADULT IV INJ
Freq: Once | INTRAVENOUS | Status: AC
  Filled 2020-08-09: qty 1000

## 2020-08-09 NOTE — ED Triage Notes (Signed)
Emergency Medicine Provider OB Triage Evaluation Note  Angela Cortez is a 29 y.o. female, [id number], at [redacted]w[redacted]d gestation who presents to the emergency department with complaints of nausea, NBNB emesis, and lower right sided abdominal cramping that began last night around 10 PM. Pt reports she has been unable to keep anything down prompting her to come to the ED. Pt denies fevers, chills, vaginal discharge, rush of fluids, vaginal bleeding, urinary symptoms.   Review of  Systems  Positive: + nausea, vomiting, abdominal cramping Negative: - fevers, chills, urinary symptoms  Physical Exam  BP 104/63 (BP Location: Left Arm)   Pulse 95   Temp 98.3 F (36.8 C) (Oral)   Resp 15   LMP 03/11/2020   SpO2 100%  General: Awake, no distress  HEENT: Atraumatic  Resp: Normal effort  Cardiac: Normal rate Abd: Nondistended, nontender  MSK: Moves all extremities without difficulty Neuro: Speech clear  Medical Decision Making  Pt evaluated for pregnancy concern and is stable for transfer to MAU. Pt is in agreement with plan for transfer.  7:17 AM Discussed with MAU APP, Angela Cortez, who accepts patient in transfer.  Clinical Impression  No diagnosis found.     Tanda Rockers, PA-C 08/09/20 [phone]

## 2020-08-09 NOTE — MAU Provider Note (Signed)
History     CSN: [id number]  Arrival date and time: 08/09/20 0710   Event Date/Time   First Provider Initiated Contact with Patient 08/09/20 [phone]      Chief Complaint  Patient presents with  . Emesis   Angela Cortez is a 29 y.o. year old G39P2022 female at 62w4dweeks gestation who presents to MAU reporting she ate BBrendolyn Pattyfor dinner and started vomiting 2 hours after finishing the food. She has not been able to keep any solids or liquids down. The last time she tried to eat or drink anything was 0530 or 0600; nothing stayed down. She denies having any sick contacts. [facility].   OB History    Gravida  5   Para  2   Term  2   Preterm      AB  2   Living  2     SAB      IAB      Ectopic      Multiple  0   Live Births  1           Past Medical History:  Diagnosis Date  . Hypertension   . No pertinent past medical history     Past Surgical History:  Procedure Laterality Date  . ACROMIO-CLAVICULAR JOINT REPAIR      Family History  Problem Relation Age of Onset  . Diabetes Father   . Hypertension Father   . Cancer Paternal Uncle   . Cancer Paternal Grandmother   . Asthma Mother     Social History   Tobacco Use  . Smoking status: Former Smoker    Packs/day: 0.25    Types: Cigarettes    Quit date: 02/04/2016    Years since quitting: 4.5  . Smokeless tobacco: Never Used  Vaping Use  . Vaping Use: Never used  Substance Use Topics  . Alcohol use: Not Currently    Comment: socially  . Drug use: No    Allergies: No Known Allergies  Medications Prior to Admission  Medication Sig Dispense Refill Last Dose  . Blood Pressure Monitor KIT 1 Device by Does not apply route once a week. To be monitored Regularly at home. 1 kit 0   . glycopyrrolate (ROBINUL-FORTE) 2 MG tablet Take 1 tablet (2 mg total) by mouth 3 (three) times daily. (Patient not taking: Reported on 07/24/2020) 60 tablet 0   .  Prenatal MV & Min w/FA-DHA (PRENATAL GUMMIES) 0.18-25 MG CHEW Chew by mouth.     . terconazole (TERAZOL 7) 0.4 % vaginal cream Place 1 applicator vaginally at bedtime. (Patient not taking: Reported on 07/05/2020) 45 g 0     Review of Systems  Constitutional: Negative.   HENT: Negative.   Eyes: Negative.   Respiratory: Negative.   Cardiovascular: Negative.   Gastrointestinal: Positive for nausea and vomiting (started 2 hrs after eating [employer] last night).  Endocrine: Negative.   Genitourinary: Negative.   Musculoskeletal: Negative.   Skin: Negative.   Allergic/Immunologic: Negative.   Neurological: Negative.   Hematological: Negative.   Psychiatric/Behavioral: Negative.    Physical Exam   Blood pressure 104/60, pulse 90, temperature 98.4 F (36.9 C), temperature source Oral, resp. rate 18, weight 75.6 kg, last menstrual period 03/11/2020, SpO2 99 %, unknown if currently breastfeeding.  Physical Exam Vitals and nursing note reviewed.  Constitutional:      Appearance: Normal appearance. She is normal weight.  HENT:  Head: Normocephalic and atraumatic.  Cardiovascular:     Rate and Rhythm: Normal rate.     Pulses: Normal pulses.  Pulmonary:     Effort: Pulmonary effort is normal.  Genitourinary:    Comments: Not indicated Musculoskeletal:        General: Normal range of motion.     Cervical back: Normal range of motion.  Skin:    General: Skin is warm and dry.  Neurological:     Mental Status: She is oriented to person, place, and time.  Psychiatric:        Mood and Affect: Mood normal.        Behavior: Behavior normal.        Thought Content: Thought content normal.        Judgment: Judgment normal.     FHTs by doppler: 158 bpm  MAU Course  Procedures  MDM IVFs: Phenergan 25 mg in LR 1000 ml @ 999 ml/hr; followed by MVI in LR 1000 ml @ 999 ml/hr -- nausea/vomiting improved PO Challenge -- patient tolerated well  Results for orders placed or performed  during the hospital encounter of 08/09/20 (from the past 24 hour(s))  Urinalysis, Routine w reflex microscopic Urine, Clean Catch     Status: Abnormal   Collection Time: 08/09/20  7:56 AM  Result Value Ref Range   Color, Urine YELLOW YELLOW   APPearance HAZY (A) CLEAR   Specific Gravity, Urine 1.026 1.005 - 1.030   pH 7.0 5.0 - 8.0   Glucose, UA NEGATIVE NEGATIVE mg/dL   Hgb urine dipstick NEGATIVE NEGATIVE   Bilirubin Urine NEGATIVE NEGATIVE   Ketones, ur 20 (A) NEGATIVE mg/dL   Protein, ur 30 (A) NEGATIVE mg/dL   Nitrite NEGATIVE NEGATIVE   Leukocytes,Ua NEGATIVE NEGATIVE   RBC / HPF 0-5 0 - 5 RBC/hpf   WBC, UA 0-5 0 - 5 WBC/hpf   Bacteria, UA RARE (A) NONE SEEN   Squamous Epithelial / LPF 0-5 0 - 5   Mucus PRESENT   Resp Panel by RT-PCR (Flu A&B, Covid) Nasopharyngeal Swab     Status: Abnormal   Collection Time: 08/09/20 11:09 AM   Specimen: Nasopharyngeal Swab; Nasopharyngeal(NP) swabs in vial transport medium  Result Value Ref Range   SARS Coronavirus 2 by RT PCR POSITIVE (A) NEGATIVE   Influenza A by PCR NEGATIVE NEGATIVE   Influenza B by PCR NEGATIVE NEGATIVE    Assessment and Plan  COVID-19 affecting pregnancy in second trimester - Plan: Discharge patient - Information provided on COVID-19 and pregnancy - mAB infusion clinic notified of patient's dx -- will call patient to schedule - Advised to not go to appt tomorrow at [facility], [place] 08/09/2020, 8:28 AM

## 2020-08-09 NOTE — MAU Note (Signed)
Started vomiting last night.  Denies diarrhea, fever, pain .  No one else at home is sick.

## 2020-08-10 ENCOUNTER — Other Ambulatory Visit: Payer: Medicaid Other

## 2020-08-12 DIAGNOSIS — Z419 Encounter for procedure for purposes other than remedying health state, unspecified: Secondary | ICD-10-CM | POA: Diagnosis not present

## 2020-08-21 ENCOUNTER — Encounter: Payer: Self-pay | Admitting: *Deleted

## 2020-08-21 ENCOUNTER — Other Ambulatory Visit: Payer: Self-pay

## 2020-08-21 ENCOUNTER — Ambulatory Visit: Payer: Medicaid Other | Attending: Obstetrics and Gynecology

## 2020-08-21 ENCOUNTER — Ambulatory Visit: Payer: Medicaid Other | Admitting: *Deleted

## 2020-08-21 VITALS — BP 109/66 | HR 93

## 2020-08-21 DIAGNOSIS — O10913 Unspecified pre-existing hypertension complicating pregnancy, third trimester: Secondary | ICD-10-CM | POA: Diagnosis not present

## 2020-08-21 DIAGNOSIS — Z362 Encounter for other antenatal screening follow-up: Secondary | ICD-10-CM | POA: Diagnosis not present

## 2020-08-21 DIAGNOSIS — Z3A23 23 weeks gestation of pregnancy: Secondary | ICD-10-CM

## 2020-08-21 DIAGNOSIS — I10 Essential (primary) hypertension: Secondary | ICD-10-CM | POA: Insufficient documentation

## 2020-08-21 DIAGNOSIS — Z8616 Personal history of COVID-19: Secondary | ICD-10-CM

## 2020-08-22 ENCOUNTER — Other Ambulatory Visit: Payer: Self-pay | Admitting: *Deleted

## 2020-08-22 DIAGNOSIS — O10912 Unspecified pre-existing hypertension complicating pregnancy, second trimester: Secondary | ICD-10-CM

## 2020-09-12 DIAGNOSIS — Z419 Encounter for procedure for purposes other than remedying health state, unspecified: Secondary | ICD-10-CM | POA: Diagnosis not present

## 2020-09-18 ENCOUNTER — Ambulatory Visit: Payer: Medicaid Other

## 2020-09-25 ENCOUNTER — Ambulatory Visit: Payer: Medicaid Other

## 2020-10-03 ENCOUNTER — Ambulatory Visit: Payer: Medicaid Other

## 2020-10-10 DIAGNOSIS — Z419 Encounter for procedure for purposes other than remedying health state, unspecified: Secondary | ICD-10-CM | POA: Diagnosis not present

## 2020-10-31 ENCOUNTER — Encounter (HOSPITAL_COMMUNITY): Payer: Self-pay | Admitting: Obstetrics and Gynecology

## 2020-10-31 ENCOUNTER — Other Ambulatory Visit: Payer: Self-pay

## 2020-10-31 ENCOUNTER — Inpatient Hospital Stay (HOSPITAL_COMMUNITY)
Admission: AD | Admit: 2020-10-31 | Discharge: 2020-10-31 | Disposition: A | Discharge status: Home / Self Care | Payer: Medicaid Other | Attending: Obstetrics and Gynecology | Admitting: Obstetrics and Gynecology

## 2020-10-31 DIAGNOSIS — Z3A33 33 weeks gestation of pregnancy: Secondary | ICD-10-CM | POA: Diagnosis not present

## 2020-10-31 DIAGNOSIS — Z87891 Personal history of nicotine dependence: Secondary | ICD-10-CM | POA: Insufficient documentation

## 2020-10-31 DIAGNOSIS — R102 Pelvic and perineal pain: Secondary | ICD-10-CM

## 2020-10-31 DIAGNOSIS — O26893 Other specified pregnancy related conditions, third trimester: Secondary | ICD-10-CM

## 2020-10-31 DIAGNOSIS — O26899 Other specified pregnancy related conditions, unspecified trimester: Secondary | ICD-10-CM

## 2020-10-31 LAB — URINALYSIS, ROUTINE W REFLEX MICROSCOPIC
Bilirubin Urine: NEGATIVE
Glucose, UA: NEGATIVE mg/dL
Hgb urine dipstick: NEGATIVE
Ketones, ur: 5 mg/dL — AB
Leukocytes,Ua: NEGATIVE
Nitrite: NEGATIVE
Protein, ur: 30 mg/dL — AB
Specific Gravity, Urine: 1.028 (ref 1.005–1.030)
pH: 6 (ref 5.0–8.0)

## 2020-10-31 MED ORDER — CYCLOBENZAPRINE HCL 5 MG PO TABS
10.0000 mg | ORAL_TABLET | Freq: Once | ORAL | Status: AC
  Administered 2020-10-31: 10 mg via ORAL
  Filled 2020-10-31: qty 2

## 2020-10-31 NOTE — Discharge Instructions (Signed)
Please take Tylenol 1000 mg (2 extra strength) every 6 hours as needed for pain.

## 2020-10-31 NOTE — MAU Note (Signed)
Hasn't been to dr's appt since like 24wks, has had so much going on.  Been having sharp pains in lower abd for the past month.  Works 8 hrs straight, doesn't really get a break, doesn't know if that is causing it. Wants to make sure everything is ok.

## 2020-10-31 NOTE — MAU Provider Note (Signed)
History     CSN: [id number]  Arrival date and time: 10/31/20 3295   Event Date/Time   First Provider Initiated Contact with Patient 10/31/20 0920      Chief Complaint  Patient presents with  . Abdominal Pain   [name] is a 30 y.o. year old G30P2022 female at [redacted]w[redacted]d weeks gestation who presents to MAU reporting she has not been seen since 24 wks (actual last date of OB visit 07/05/2020). She reports she has had "too much going on." She reports she stands 8 hours a day while working at [employer]. She reports that she has had sharp pain in lower abdomen (usually on one side) x 1 month. She is thinking this pain is caused by her work. She is here today to "make sure everything is ok." She is a patient at [facility]. The FOB is present and contributing to the history taking.   OB History    Gravida  5   Para  2   Term  2   Preterm      AB  2   Living  2     SAB      IAB  2   Ectopic      Multiple  0   Live Births  2           Past Medical History:  Diagnosis Date  . Hypertension   . No pertinent past medical history     Past Surgical History:  Procedure Laterality Date  . ACROMIO-CLAVICULAR JOINT REPAIR      Family History  Problem Relation Age of Onset  . Diabetes Father   . Hypertension Father   . Cancer Paternal Uncle   . Cancer Paternal Grandmother   . Asthma Mother     Social History   Tobacco Use  . Smoking status: Former Smoker    Packs/day: 0.25    Types: Cigarettes    Quit date: 02/04/2016    Years since quitting: 4.7  . Smokeless tobacco: Never Used  Vaping Use  . Vaping Use: Every day  Substance Use Topics  . Alcohol use: Not Currently    Comment: socially  . Drug use: No    Allergies: No Known Allergies  No medications prior to admission.    Review of Systems  Constitutional: Negative.   HENT: Negative.   Eyes: Negative.   Respiratory: Negative.   Cardiovascular: Negative.   Gastrointestinal: Negative.    Endocrine: Negative.   Genitourinary: Positive for pelvic pain (lower abdominal cramping).  Musculoskeletal: Negative.   Skin: Negative.   Allergic/Immunologic: Negative.   Neurological: Negative.   Hematological: Negative.   Psychiatric/Behavioral: Negative.    Physical Exam   Blood pressure 108/68, pulse 85, temperature 98.4 F (36.9 C), temperature source Oral, resp. rate 18, height 5\' 3"  (1.6 m), weight 78.7 kg, last menstrual period 03/11/2020, SpO2 100 %, unknown if currently breastfeeding.  Physical Exam Vitals and nursing note reviewed.  Constitutional:      Appearance: Normal appearance. She is normal weight.  Abdominal:     Palpations: Abdomen is soft.  Genitourinary:    General: Normal vulva.     Comments: Dilation: Closed Effacement (%): 70 Cervical Position: Posterior Station: Ballotable Presentation: Vertex Exam by: Sunday Corn,. CNM  Skin:    General: Skin is warm and dry.  Neurological:     Mental Status: She is alert and oriented to person, place, and time.  Psychiatric:  Mood and Affect: Mood normal.        Behavior: Behavior normal.        Thought Content: Thought content normal.        Judgment: Judgment normal.     MAU Course  Procedures Patient informed that the ultrasound is considered a limited OB ultrasound and is not intended to be a complete ultrasound exam.  Patient also informed that the ultrasound is not being completed with the intent of assessing for fetal or placental anomalies or any pelvic abnormalities.  Explained that the purpose of today's ultrasound is to assess for patient reassurance.  Baby was found to be in a cephalic presentation. Patient acknowledges the purpose of the exam and the limitations of the study.  MDM CCUA Informal Bedside U/S  Results for orders placed or performed during the hospital encounter of 10/31/20 (from the past 24 hour(s))  Urinalysis, Routine w reflex microscopic Urine, Clean Catch     Status:  Abnormal   Collection Time: 10/31/20  9:09 AM  Result Value Ref Range   Color, Urine AMBER (A) YELLOW   APPearance CLOUDY (A) CLEAR   Specific Gravity, Urine 1.028 1.005 - 1.030   pH 6.0 5.0 - 8.0   Glucose, UA NEGATIVE NEGATIVE mg/dL   Hgb urine dipstick NEGATIVE NEGATIVE   Bilirubin Urine NEGATIVE NEGATIVE   Ketones, ur 5 (A) NEGATIVE mg/dL   Protein, ur 30 (A) NEGATIVE mg/dL   Nitrite NEGATIVE NEGATIVE   Leukocytes,Ua NEGATIVE NEGATIVE   RBC / HPF 0-5 0 - 5 RBC/hpf   WBC, UA 6-10 0 - 5 WBC/hpf   Bacteria, UA RARE (A) NONE SEEN   Squamous Epithelial / LPF 21-50 0 - 5   Mucus PRESENT     Assessment and Plan  Pain of round ligament affecting pregnancy, antepartum - Plan: Discharge patient - Discussed RLP and comfort measures - Information provided on RLP  - Advised to take Tylenol 1000 mg every 6 hours prn pain - Call [facility] to get scheduled, if she has not heard from them by Thursday 11/02/20 - Patient verbalized an understanding of the plan of care and agrees.   Laury Deep, CNM 10/31/2020, 9:20 AM

## 2020-11-10 DIAGNOSIS — Z419 Encounter for procedure for purposes other than remedying health state, unspecified: Secondary | ICD-10-CM | POA: Diagnosis not present

## 2020-11-14 ENCOUNTER — Other Ambulatory Visit: Payer: Self-pay

## 2020-11-14 ENCOUNTER — Encounter: Payer: Self-pay | Admitting: Obstetrics and Gynecology

## 2020-11-14 ENCOUNTER — Other Ambulatory Visit (HOSPITAL_COMMUNITY)
Admission: RE | Admit: 2020-11-14 | Discharge: 2020-11-14 | Disposition: A | Discharge status: Home / Self Care | Payer: Medicaid Other | Source: Ambulatory Visit | Attending: Nurse Practitioner | Admitting: Nurse Practitioner

## 2020-11-14 ENCOUNTER — Ambulatory Visit (INDEPENDENT_AMBULATORY_CARE_PROVIDER_SITE_OTHER): Payer: Medicaid Other | Admitting: Advanced Practice Midwife

## 2020-11-14 VITALS — BP 114/72 | HR 113 | Wt 177.0 lb

## 2020-11-14 DIAGNOSIS — O26893 Other specified pregnancy related conditions, third trimester: Secondary | ICD-10-CM | POA: Diagnosis not present

## 2020-11-14 DIAGNOSIS — Z3403 Encounter for supervision of normal first pregnancy, third trimester: Secondary | ICD-10-CM | POA: Diagnosis not present

## 2020-11-14 DIAGNOSIS — N898 Other specified noninflammatory disorders of vagina: Secondary | ICD-10-CM

## 2020-11-14 DIAGNOSIS — Z3009 Encounter for other general counseling and advice on contraception: Secondary | ICD-10-CM

## 2020-11-14 DIAGNOSIS — Z3A35 35 weeks gestation of pregnancy: Secondary | ICD-10-CM

## 2020-11-14 DIAGNOSIS — Z3483 Encounter for supervision of other normal pregnancy, third trimester: Secondary | ICD-10-CM

## 2020-11-14 DIAGNOSIS — O0930 Supervision of pregnancy with insufficient antenatal care, unspecified trimester: Secondary | ICD-10-CM

## 2020-11-14 NOTE — Progress Notes (Signed)
   PRENATAL VISIT NOTE  Subjective:  Angela Cortez is a 30 y.o. G2I9485 at [redacted]w[redacted]d being seen today for ongoing prenatal care.  She is currently monitored for the following issues for this low-risk pregnancy and has Mild tetrahydrocannabinol (THC) abuse; Supervision of normal pregnancy, antepartum; Obesity in pregnancy; ASCUS of cervix with negative high risk HPV; and Ptyalism on their problem list.  Patient reports no complaints.  Contractions: Irregular. Vag. Bleeding: None.  Movement: Present. Denies leaking of fluid.   The following portions of the patient's history were reviewed and updated as appropriate: allergies, current medications, past family history, past medical history, past social history, past surgical history and problem list. Problem list updated.  Objective:   Vitals:   11/14/20 1318  BP: 114/72  Pulse: (!) 113  Weight: 177 lb (80.3 kg)    Fetal Status: Fetal Heart Rate (bpm): 150 Fundal Height: 35 cm Movement: Present  Presentation: Vertex  General:  Alert, oriented and cooperative. Patient is in no acute distress.  Skin: Skin is warm and dry. No rash noted.   Cardiovascular: Normal heart rate noted  Respiratory: Normal respiratory effort, no problems with respiration noted  Abdomen: Soft, gravid, appropriate for gestational age.  Pain/Pressure: Present     Pelvic: Cervical exam deferred        Extremities: Normal range of motion.  Edema: Trace  Mental Status: Normal mood and affect. Normal behavior. Normal judgment and thought content.   Assessment and Plan:  Pregnancy: [id number] at [redacted]w[redacted]d  1. Encounter for supervision of other normal pregnancy in third trimester - GBS and GCC next week - Interested in [place] at 39 weeks - Hemoglobin A1c - CBC - RPR - HIV Antibody (routine testing w rflx)  2. Unwanted fertility - Considering interval tubal, concerns about lack of family support  3. Prenatal care insufficient, unspecified trimester - Not seen in for  outpatient care since 06/2020 (MyChart appt) - GBS and GCC not collected today as patient plans to attend all remaining appointments  4. [redacted] weeks gestation of pregnancy   Preterm labor symptoms and general obstetric precautions including but not limited to vaginal bleeding, contractions, leaking of fluid and fetal movement were reviewed in detail with the patient. Please refer to After Visit Summary for other counseling recommendations.  Return in about 1 week (around 11/21/2020) for MD.  Future Appointments  Date Time Provider [place]  11/21/2020  9:45 AM Chancy Milroy, MD [place] None    Darlina Rumpf, CNM

## 2020-11-14 NOTE — Patient Instructions (Signed)
First Stage of Labor Labor is your body's natural process of moving your baby and other structures, including the placenta and umbilical cord, out of your uterus. There are three stages of labor. How long each stage lasts is different for every woman. But certain events happen during each stage that are the same for everyone.  The first stage starts when true labor begins. This stage ends when your cervix, which is the opening from your uterus into your vagina, is completely open (dilated).  The second stage begins when your cervix is fully dilated and you start pushing. This stage ends when your baby is born.  The third stage is the delivery of the organ that nourished your baby during pregnancy (placenta). First stage of labor As your due date gets closer, you may start to notice certain physical changes that mean labor is going to start soon. You may feel that your baby has dropped lower into your pelvis. You may experience irregular, often painless, contractions that go away when you walk around or lie down ([employer] contractions). This is also called false labor. The first stage of labor begins when you start having contractions that come at regular (evenly spaced) intervals and your cervix starts to get thinner and wider in preparation for your baby to pass through. Birth care providers measure the dilation of your cervix in centimeters (cm). One centimeter is a little less than one-half of an inch. The first stage ends when your cervix is dilated to 10 cm. The first stage of labor is divided into three phases:  Early phase.  Active phase.  Transitional phase. The length of the first stage of labor varies. It may be longer if this is your first pregnancy. You may spend most of this stage at home trying to relax and stay comfortable. How does this affect me? During the first stage of labor, you will move through three phases. What happens in the early phase?  You will start to have  regular contractions that last 30-60 seconds. Contractions may come every 5-20 minutes. Keep track of your contractions and call your birth care provider.  Your water may break during this phase.  You may notice a clear or slightly bloody discharge of mucus (mucus plug) from your vagina.  Your cervix will dilate to 3-6 cm. What happens in the active phase? The active phase usually lasts 3-5 hours. You may go to the hospital or birth center around this time. During the active phase:  Your contractions will become stronger, longer, and more uncomfortable.  Your contractions may last 45-90 seconds and come every 3-5 minutes.  You may feel lower back pain.  Your birth care providers may examine your cervix and feel your belly to find the position of your baby.  You may have a monitor strapped to your belly to measure your contractions and your baby's heart rate.  You may start using your pain management options.  Your cervix may be dilated to 6 cm and may start to dilate more quickly. What happens in the transitional phase? The transitional phase typically lasts from 30 minutes to 2 hours. At the end of this phase, your cervix will be fully dilated to 10 cm. During the transitional phase:  Contractions will get stronger and longer.  Contractions may last 60-90 seconds and come less than 2 minutes apart.  You may feel hot flashes, chills, or nausea. How does this affect my baby? During the first stage of labor, your baby will  gradually move down into your birth canal. Follow these instructions at home and in the hospital or birth center:  When labor first begins, try to stay calm. You are still in the early phase. If it is night, try to get some sleep. If it is day, try to relax and save your energy. You may want to make some calls and get ready to go to the hospital or birth center.  When you are in the early phase, try these methods to help ease discomfort: ? Deep breathing and  muscle relaxation. ? Taking a walk. ? Taking a warm bath or shower.  Drink some fluids and have a light snack if you feel like it.  Keep track of your contractions.  Based on the plan you created with your birth care provider, call when your contractions indicate it is time.  If your water breaks, note the time, color, and odor of the fluid.  When you are in the active phase, do your breathing exercises and rely on your support people and your team of birth care providers.   Contact a health care provider if:  Your contractions are strong and regular.  You have lower back pain or cramping.  Your water breaks.  You lose your mucus plug. Get help right away if you:  Have a severe headache that does not go away.  Have changes in your vision.  Have severe pain in your upper belly.  Do not feel the baby move.  Have bright red bleeding. Summary  The first stage of labor starts when true labor begins, and it ends when your cervix is dilated to 10 cm.  The first stage of labor has three phases: early, active, and transitional.  Your baby moves into the birth canal during the first stage of labor.  You may have contractions that become stronger and longer. You may also lose your mucus plug and have your water break.  Call your birth care provider when your contractions are frequent and strong enough to go to the hospital or birth center. This information is not intended to replace advice given to you by your health care provider. Make sure you discuss any questions you have with your health care provider. Document Revised: 11/19/2018 Document Reviewed: 10/12/2017 Elsevier Patient Education  [place].

## 2020-11-14 NOTE — Progress Notes (Signed)
+   Fetal movement. No complaints.

## 2020-11-15 LAB — CBC
Hematocrit: 32 % — ABNORMAL LOW (ref 34.0–46.6)
Hemoglobin: 10.6 g/dL — ABNORMAL LOW (ref 11.1–15.9)
MCH: 30.5 pg (ref 26.6–33.0)
MCHC: 33.1 g/dL (ref 31.5–35.7)
MCV: 92 fL (ref 79–97)
Platelets: 212 10*3/uL (ref 150–450)
RBC: 3.47 x10E6/uL — ABNORMAL LOW (ref 3.77–5.28)
RDW: 12.3 % (ref 11.7–15.4)
WBC: 5.7 10*3/uL (ref 3.4–10.8)

## 2020-11-15 LAB — HIV ANTIBODY (ROUTINE TESTING W REFLEX): HIV Screen 4th Generation wRfx: NONREACTIVE

## 2020-11-15 LAB — HEMOGLOBIN A1C
Est. average glucose Bld gHb Est-mCnc: 103 mg/dL
Hgb A1c MFr Bld: 5.2 % (ref 4.8–5.6)

## 2020-11-15 LAB — CERVICOVAGINAL ANCILLARY ONLY
Bacterial Vaginitis (gardnerella): POSITIVE — AB
Candida Glabrata: NEGATIVE
Candida Vaginitis: NEGATIVE
Chlamydia: NEGATIVE
Comment: NEGATIVE
Comment: NEGATIVE
Comment: NEGATIVE
Comment: NEGATIVE
Comment: NEGATIVE
Comment: NORMAL
Neisseria Gonorrhea: NEGATIVE
Trichomonas: NEGATIVE

## 2020-11-15 LAB — RPR: RPR Ser Ql: NONREACTIVE

## 2020-11-17 ENCOUNTER — Other Ambulatory Visit: Payer: Self-pay | Admitting: Advanced Practice Midwife

## 2020-11-17 DIAGNOSIS — B9689 Other specified bacterial agents as the cause of diseases classified elsewhere: Secondary | ICD-10-CM

## 2020-11-17 DIAGNOSIS — N760 Acute vaginitis: Secondary | ICD-10-CM

## 2020-11-17 MED ORDER — METRONIDAZOLE 500 MG PO TABS
500.0000 mg | ORAL_TABLET | Freq: Two times a day (BID) | ORAL | 0 refills | Status: DC
Start: 2020-11-17 — End: 2020-12-19

## 2020-11-21 ENCOUNTER — Other Ambulatory Visit: Payer: Self-pay

## 2020-11-21 ENCOUNTER — Encounter: Payer: Self-pay | Admitting: Obstetrics and Gynecology

## 2020-11-21 ENCOUNTER — Encounter: Payer: Medicaid Other | Admitting: Nurse Practitioner

## 2020-11-21 ENCOUNTER — Ambulatory Visit (INDEPENDENT_AMBULATORY_CARE_PROVIDER_SITE_OTHER): Payer: Medicaid Other | Admitting: Obstetrics and Gynecology

## 2020-11-21 VITALS — BP 112/69 | HR 105 | Wt 176.0 lb

## 2020-11-21 DIAGNOSIS — Z3480 Encounter for supervision of other normal pregnancy, unspecified trimester: Secondary | ICD-10-CM | POA: Diagnosis not present

## 2020-11-21 DIAGNOSIS — Z3483 Encounter for supervision of other normal pregnancy, third trimester: Secondary | ICD-10-CM

## 2020-11-21 NOTE — Patient Instructions (Signed)

## 2020-11-21 NOTE — Progress Notes (Signed)
Subjective:  Angela Cortez is a 30 y.o. [id number] at [redacted]w[redacted]d being seen today for ongoing prenatal care.  She is currently monitored for the following issues for this low-risk pregnancy and has Mild tetrahydrocannabinol (THC) abuse; Supervision of normal pregnancy, antepartum; Obesity in pregnancy; ASCUS of cervix with negative high risk HPV; and Ptyalism on their problem list.  Patient reports general discomforts of pregnancy.  Contractions: Not present. Vag. Bleeding: None.  Movement: Present. Denies leaking of fluid.   The following portions of the patient's history were reviewed and updated as appropriate: allergies, current medications, past family history, past medical history, past social history, past surgical history and problem list. Problem list updated.  Objective:   Vitals:   11/21/20 0951  BP: 112/69  Pulse: (!) 105  Weight: 176 lb (79.8 kg)    Fetal Status: Fetal Heart Rate (bpm): 160   Movement: Present     General:  Alert, oriented and cooperative. Patient is in no acute distress.  Skin: Skin is warm and dry. No rash noted.   Cardiovascular: Normal heart rate noted  Respiratory: Normal respiratory effort, no problems with respiration noted  Abdomen: Soft, gravid, appropriate for gestational age. Pain/Pressure: Present     Pelvic:  Cervical exam performed        Extremities: Normal range of motion.  Edema: Trace  Mental Status: Normal mood and affect. Normal behavior. Normal judgment and thought content.   Urinalysis:      Assessment and Plan:  Pregnancy: [id number] at [redacted]w[redacted]d  1. Supervision of other normal pregnancy, antepartum Labor precautions - Strep Gp B NAA  Term labor symptoms and general obstetric precautions including but not limited to vaginal bleeding, contractions, leaking of fluid and fetal movement were reviewed in detail with the patient. Please refer to After Visit Summary for other counseling recommendations.  Return in about 1 week (around  11/28/2020) for OB visit, face to face, any provider.   Chancy Milroy, MD

## 2020-11-21 NOTE — Progress Notes (Signed)
+   Fetal movement. No complaints.

## 2020-11-23 LAB — STREP GP B NAA: Strep Gp B NAA: POSITIVE — AB

## 2020-11-27 ENCOUNTER — Encounter: Payer: Self-pay | Admitting: Obstetrics and Gynecology

## 2020-11-27 DIAGNOSIS — O99820 Streptococcus B carrier state complicating pregnancy: Secondary | ICD-10-CM | POA: Insufficient documentation

## 2020-11-29 ENCOUNTER — Other Ambulatory Visit: Payer: Self-pay

## 2020-11-29 ENCOUNTER — Ambulatory Visit (INDEPENDENT_AMBULATORY_CARE_PROVIDER_SITE_OTHER): Payer: Medicaid Other | Admitting: Nurse Practitioner

## 2020-11-29 VITALS — BP 109/74 | HR 92 | Wt 178.8 lb

## 2020-11-29 DIAGNOSIS — O99820 Streptococcus B carrier state complicating pregnancy: Secondary | ICD-10-CM

## 2020-11-29 DIAGNOSIS — Z3A37 37 weeks gestation of pregnancy: Secondary | ICD-10-CM

## 2020-11-29 DIAGNOSIS — Z3480 Encounter for supervision of other normal pregnancy, unspecified trimester: Secondary | ICD-10-CM

## 2020-11-29 NOTE — Progress Notes (Signed)
Pt reports fetal movement with some pressure. 

## 2020-11-29 NOTE — Progress Notes (Addendum)
    Subjective:  Angela Cortez is a 30 y.o. [id number] at [redacted]w[redacted]d being seen today for ongoing prenatal care.  She is currently monitored for the following issues for this low-risk pregnancy and has Mild tetrahydrocannabinol (THC) abuse; Supervision of normal pregnancy, antepartum; Obesity in pregnancy; ASCUS of cervix with negative high risk HPV; Ptyalism; and GBS (group B Streptococcus carrier), +RV culture, currently pregnant on their problem list.  Patient reports pressure.  Contractions: Not present. Vag. Bleeding: None.  Movement: Present. Denies leaking of fluid.   The following portions of the patient's history were reviewed and updated as appropriate: allergies, current medications, past family history, past medical history, past social history, past surgical history and problem list. Problem list updated.  Objective:   Vitals:   11/29/20 0942  BP: 109/74  Pulse: 92  Weight: 178 lb 12.8 oz (81.1 kg)    Fetal Status: Fetal Heart Rate (bpm): 142   Movement: Present     General:  Alert, oriented and cooperative. Patient is in no acute distress.  Skin: Skin is warm and dry. No rash noted.   Cardiovascular: Normal heart rate noted  Respiratory: Normal respiratory effort, no problems with respiration noted  Abdomen: Soft, gravid, appropriate for gestational age. Pain/Pressure: Present     Pelvic:  Cervical exam deferred        Extremities: Normal range of motion.  Edema: Trace  Mental Status: Normal mood and affect. Normal behavior. Normal judgment and thought content.   Urinalysis:      Assessment and Plan:  Pregnancy: [id number] at [redacted]w[redacted]d  1. Supervision of other normal pregnancy, antepartum Doing well.  BP normal.  No edema.  Baby moving well.  Reports increasing pressure. Wants to be induced at 39 weeks.  Advised to discuss at next visit and will do cervical check then and make recommendations. Unsure of contraception.  Unsure if she wants more children.  Had poor experience  with Depo and does not want it.  Had lots of bleeding - probably not a candidate for Nexplanon.  Reviewed IUD but she has heard of problems with the IUD and is not really interested in trying it.  Advised IUD possibly could be placed after the placenta is delivered. Advised it is best not to become pregnant until the baby is at least 17 months of age.  2. GBS (group B Streptococcus carrier), +RV culture, currently pregnant Reviewed with patient and info in AVS   Term labor symptoms and general obstetric precautions including but not limited to vaginal bleeding, contractions, leaking of fluid and fetal movement were reviewed in detail with the patient. Please refer to After Visit Summary for other counseling recommendations.  Return in about 1 week (around 12/06/2020) for in person ROB.  Earlie Server, RN, MSN, NP-BC Nurse Practitioner, [facility] for [employer], [place] [facility] 11/29/2020 10:04 AM

## 2020-11-29 NOTE — Patient Instructions (Signed)

## 2020-12-08 ENCOUNTER — Ambulatory Visit (INDEPENDENT_AMBULATORY_CARE_PROVIDER_SITE_OTHER): Payer: Medicaid Other

## 2020-12-08 ENCOUNTER — Other Ambulatory Visit: Payer: Self-pay

## 2020-12-08 VITALS — BP 118/77 | HR 99 | Wt 181.0 lb

## 2020-12-08 DIAGNOSIS — O99820 Streptococcus B carrier state complicating pregnancy: Secondary | ICD-10-CM

## 2020-12-08 DIAGNOSIS — Z3480 Encounter for supervision of other normal pregnancy, unspecified trimester: Secondary | ICD-10-CM

## 2020-12-08 DIAGNOSIS — Z3A38 38 weeks gestation of pregnancy: Secondary | ICD-10-CM

## 2020-12-08 NOTE — Progress Notes (Signed)
   LOW-RISK PREGNANCY OFFICE VISIT  Patient name: Angela Cortez MRN [id number]  Date of birth: [birth year] Chief Complaint:   Routine Prenatal Visit  Subjective:   Angela Cortez is a 30 y.o. [id number] female at [redacted]w[redacted]d with an Estimated Date of Delivery: 12/16/20 being seen today for ongoing management of a low-risk pregnancy aeb has Mild tetrahydrocannabinol (THC) abuse; Supervision of normal pregnancy, antepartum; Obesity in pregnancy; ASCUS of cervix with negative high risk HPV; Ptyalism; and GBS (group B Streptococcus carrier), +RV culture, currently pregnant on their problem list.  Patient presents today with occasional contractions.  Patient endorses fetal movement. Patient denies abdominal cramping or contractions.  Patient reports vaginal discharge that is "liquidy" but not saturating through clothes or causing irritation.  Patient is unsure of how long it has been occurring. concerns including abnormal discharge, leaking of fluid, and bleeding.  Contractions: Irregular. Vag. Bleeding: None.  Movement: Present.  Reviewed past medical,surgical, social, obstetrical and family history as well as problem list, medications and allergies.  Objective   Vitals:   12/08/20 1104  BP: 118/77  Pulse: 99  Weight: 181 lb (82.1 kg)  Body mass index is 32.06 kg/m.  Total Weight Gain:26 lb (11.8 kg)         Physical Examination:   General appearance: Well appearing, and in no distress  Mental status: Alert, oriented to person, place, and time  Skin: Warm & dry  Cardiovascular: Normal heart rate noted  Respiratory: Normal respiratory effort, no distress  Abdomen: Soft, gravid, nontender, AGA with Fundal Height: 40 cm  Pelvic: Cervical exam deferred           Extremities: Edema: Trace  Fetal Status: Fetal Heart Rate (bpm): 137  Movement: Present   No results found for this or any previous visit (from the past 24 hour(s)).  Assessment & Plan:  Low-risk pregnancy of a 30 y.o., [id number] at  [redacted]w[redacted]d with an Estimated Date of Delivery: 12/16/20   1. Supervision of other normal pregnancy, antepartum -Anticipatory guidance for upcoming appts. -Patient to next appt in 1 weeks for an in-person visit. -Informed that cervix should be checked at next visit.  -Briefly discussed infant inpatient circumcision.  Reassured that it is covered by medicaid.   2. GBS (group B Streptococcus carrier), +RV culture, currently pregnant -Will treat in labor  3. [redacted] weeks gestation of pregnancy -Doing well overall. -Discussed induction of labor at 40+ weeks. -Informed that next available date is Monday May 9th at [facility] -Patient agreeable -Request sent and orders placed. -Instructed to monitor vaginal discharge and if odor, irritation, or saturation through underwear and/or clothes occur report to MAU for evaluation.    Meds: No orders of the defined types were placed in this encounter.  Labs/procedures today:  Lab Orders  No laboratory test(s) ordered today     Reviewed: Term labor symptoms and general obstetric precautions including but not limited to vaginal bleeding, contractions, leaking of fluid and fetal movement were reviewed in detail with the patient.  All questions were answered.  Follow-up: Return in about 1 week (around 12/15/2020) for [place].  No orders of the defined types were placed in this encounter.  Maryann Conners MSN, CNM 12/08/2020

## 2020-12-08 NOTE — Patient Instructions (Signed)
Labor Induction Labor induction is when steps are taken to cause a pregnant woman to begin the labor process. Most women go into labor on their own between 37 weeks and 42 weeks of pregnancy. When this does not happen, or when there is a medical need for labor to begin, steps may be taken to induce, or bring on, labor. Labor induction causes a pregnant woman's uterus to contract. It also causes the cervix to soften (ripen), open (dilate), and thin out. Usually, labor is not induced before 39 weeks of pregnancy unless there is a medical reason to do so. When is labor induction considered? Labor induction may be right for you if:  Your pregnancy lasts longer than 41 to 42 weeks.  Your placenta is separating from your uterus (placental abruption).  You have a rupture of membranes and your labor does not begin.  You have health problems, like diabetes or high blood pressure (preeclampsia) during your pregnancy.  Your baby has stopped growing or does not have enough amniotic fluid. Before labor induction begins, your health care provider will consider the following factors:  Your medical condition and the baby's condition.  How many weeks you have been pregnant.  How mature the baby's lungs are.  The condition of your cervix.  The position of the baby.  The size of your birth canal. Tell a health care provider about:  Any allergies you have.  All medicines you are taking, including vitamins, herbs, eye drops, creams, and over-the-counter medicines.  Any problems you or your family members have had with anesthetic medicines.  Any surgeries you have had.  Any blood disorders you have.  Any medical conditions you have. What are the risks? Generally, this is a safe procedure. However, problems may occur, including:  Failed induction.  Changes in fetal heart rate, such as being too high, too low, or irregular (erratic).  Infection in the mother or the baby.  Increased risk of  having a cesarean delivery.  Breaking off (abruption) of the placenta from the uterus. This is rare.  Rupture of the uterus. This is very rare.  Your baby could fail to get enough blood flow or oxygen. This can be life-threatening. When induction is needed for medical reasons, the benefits generally outweigh the risks. What happens during the procedure? During the procedure, your health care provider will use one of these methods to induce labor:  Stripping the membranes. In this method, the amniotic sac tissue is gently separated from the cervix. This causes the following to happen: ? Your cervix stretches, which in turn causes the release of prostaglandins. ? Prostaglandins induce labor and cause the uterus to contract. ? This procedure is often done in an office visit. You will be sent home to wait for contractions to begin.  Prostaglandin medicine. This medicine starts contractions and causes the cervix to dilate and ripen. This can be taken by mouth (orally) or by being inserted into the vagina (suppository).  Inserting a small, thin tube (catheter) with a balloon into the vagina and then expanding the balloon with water to dilate the cervix.  Breaking the water. In this method, a small instrument is used to make a small hole in the amniotic sac. This eventually causes the amniotic sac to break. Contractions should begin within a few hours.  Medicine to trigger or strengthen contractions. This medicine is given through an IV that is inserted into a vein in your arm. This procedure may vary among health care providers and hospitals.     Where to find more information  [employer] of [employer]: www.marchofdimes.org  The American College of Obstetricians and Gynecologists: www.acog.org Summary  Labor induction causes a pregnant woman's uterus to contract. It also causes the cervix to soften (ripen), open (dilate), and thin out.  Labor is usually not induced before 39 weeks of pregnancy unless  there is a medical reason to do so.  When induction is needed for medical reasons, the benefits generally outweigh the risks.  Talk with your health care provider about which methods of labor induction are right for you. This information is not intended to replace advice given to you by your health care provider. Make sure you discuss any questions you have with your health care provider. Document Revised: 05/11/2020 Document Reviewed: 05/11/2020 Elsevier Patient Education  2021 Elsevier Inc.  

## 2020-12-10 DIAGNOSIS — Z419 Encounter for procedure for purposes other than remedying health state, unspecified: Secondary | ICD-10-CM | POA: Diagnosis not present

## 2020-12-15 ENCOUNTER — Encounter: Payer: Medicaid Other | Admitting: Student

## 2020-12-17 ENCOUNTER — Inpatient Hospital Stay (HOSPITAL_COMMUNITY): Payer: Medicaid Other | Admitting: Anesthesiology

## 2020-12-17 ENCOUNTER — Encounter (HOSPITAL_COMMUNITY): Payer: Self-pay | Admitting: Obstetrics & Gynecology

## 2020-12-17 ENCOUNTER — Other Ambulatory Visit: Payer: Self-pay

## 2020-12-17 ENCOUNTER — Inpatient Hospital Stay (HOSPITAL_COMMUNITY)
Admission: AD | Admit: 2020-12-17 | Discharge: 2020-12-19 | DRG: 787 | Disposition: A | Discharge status: Home / Self Care | Payer: Medicaid Other | Attending: Obstetrics & Gynecology | Admitting: Obstetrics & Gynecology

## 2020-12-17 ENCOUNTER — Encounter (HOSPITAL_COMMUNITY)
Admission: AD | Disposition: A | Discharge status: Home / Self Care | Payer: Self-pay | Source: Home / Self Care | Attending: Obstetrics & Gynecology

## 2020-12-17 DIAGNOSIS — Z3A40 40 weeks gestation of pregnancy: Secondary | ICD-10-CM

## 2020-12-17 DIAGNOSIS — F1210 Cannabis abuse, uncomplicated: Secondary | ICD-10-CM | POA: Diagnosis not present

## 2020-12-17 DIAGNOSIS — O480 Post-term pregnancy: Secondary | ICD-10-CM

## 2020-12-17 DIAGNOSIS — O4292 Full-term premature rupture of membranes, unspecified as to length of time between rupture and onset of labor: Principal | ICD-10-CM | POA: Diagnosis present

## 2020-12-17 DIAGNOSIS — O99824 Streptococcus B carrier state complicating childbirth: Secondary | ICD-10-CM

## 2020-12-17 DIAGNOSIS — O99214 Obesity complicating childbirth: Secondary | ICD-10-CM | POA: Diagnosis present

## 2020-12-17 DIAGNOSIS — Z87891 Personal history of nicotine dependence: Secondary | ICD-10-CM | POA: Diagnosis not present

## 2020-12-17 DIAGNOSIS — O26893 Other specified pregnancy related conditions, third trimester: Secondary | ICD-10-CM | POA: Diagnosis not present

## 2020-12-17 DIAGNOSIS — O99324 Drug use complicating childbirth: Secondary | ICD-10-CM | POA: Diagnosis present

## 2020-12-17 DIAGNOSIS — Z3490 Encounter for supervision of normal pregnancy, unspecified, unspecified trimester: Secondary | ICD-10-CM | POA: Diagnosis present

## 2020-12-17 DIAGNOSIS — Z20822 Contact with and (suspected) exposure to covid-19: Secondary | ICD-10-CM | POA: Diagnosis present

## 2020-12-17 DIAGNOSIS — O99210 Obesity complicating pregnancy, unspecified trimester: Secondary | ICD-10-CM | POA: Diagnosis present

## 2020-12-17 DIAGNOSIS — R87610 Atypical squamous cells of undetermined significance on cytologic smear of cervix (ASC-US): Secondary | ICD-10-CM | POA: Diagnosis present

## 2020-12-17 DIAGNOSIS — O99820 Streptococcus B carrier state complicating pregnancy: Secondary | ICD-10-CM

## 2020-12-17 LAB — CBC
HCT: 36.8 % (ref 36.0–46.0)
Hemoglobin: 11.8 g/dL — ABNORMAL LOW (ref 12.0–15.0)
MCH: 31 pg (ref 26.0–34.0)
MCHC: 32.1 g/dL (ref 30.0–36.0)
MCV: 96.6 fL (ref 80.0–100.0)
Platelets: 212 10*3/uL (ref 150–400)
RBC: 3.81 MIL/uL — ABNORMAL LOW (ref 3.87–5.11)
RDW: 13.6 % (ref 11.5–15.5)
WBC: 7.2 10*3/uL (ref 4.0–10.5)
nRBC: 0 % (ref 0.0–0.2)

## 2020-12-17 LAB — RESP PANEL BY RT-PCR (FLU A&B, COVID) ARPGX2
Influenza A by PCR: NEGATIVE
Influenza B by PCR: NEGATIVE
SARS Coronavirus 2 by RT PCR: NEGATIVE

## 2020-12-17 LAB — TYPE AND SCREEN
ABO/RH(D): O POS
Antibody Screen: NEGATIVE

## 2020-12-17 SURGERY — Surgical Case
Anesthesia: Epidural | Wound class: Clean Contaminated

## 2020-12-17 MED ORDER — FENTANYL CITRATE (PF) 100 MCG/2ML IJ SOLN
50.0000 ug | INTRAMUSCULAR | Status: DC | PRN
  Administered 2020-12-17: 100 ug via INTRAVENOUS
  Filled 2020-12-17: qty 2

## 2020-12-17 MED ORDER — SCOPOLAMINE 1 MG/3DAYS TD PT72
MEDICATED_PATCH | TRANSDERMAL | Status: DC | PRN
  Administered 2020-12-17: 1 via TRANSDERMAL

## 2020-12-17 MED ORDER — MORPHINE SULFATE (PF) 0.5 MG/ML IJ SOLN
INTRAMUSCULAR | Status: DC | PRN
  Administered 2020-12-17: 1 mg via EPIDURAL

## 2020-12-17 MED ORDER — SOD CITRATE-CITRIC ACID 500-334 MG/5ML PO SOLN
30.0000 mL | ORAL | Status: DC | PRN
  Filled 2020-12-17: qty 15

## 2020-12-17 MED ORDER — SOD CITRATE-CITRIC ACID 500-334 MG/5ML PO SOLN
30.0000 mL | ORAL | Status: AC
  Administered 2020-12-17: 30 mL via ORAL

## 2020-12-17 MED ORDER — PHENYLEPHRINE 40 MCG/ML (10ML) SYRINGE FOR IV PUSH (FOR BLOOD PRESSURE SUPPORT): 80.0000 ug | PREFILLED_SYRINGE | INTRAVENOUS | Status: DC | PRN

## 2020-12-17 MED ORDER — SODIUM CHLORIDE 0.9 % IV SOLN: 1.0000 g | Freq: Three times a day (TID) | INTRAVENOUS | Status: DC

## 2020-12-17 MED ORDER — SODIUM CHLORIDE 0.9 % IV SOLN: INTRAVENOUS | Status: DC | PRN

## 2020-12-17 MED ORDER — LACTATED RINGERS IV SOLN: INTRAVENOUS | Status: DC

## 2020-12-17 MED ORDER — FENTANYL-BUPIVACAINE-NACL 0.5-0.125-0.9 MG/250ML-% EP SOLN
12.0000 mL/h | EPIDURAL | Status: DC | PRN
  Administered 2020-12-17: 12 mL/h via EPIDURAL

## 2020-12-17 MED ORDER — SODIUM CHLORIDE 0.9 % IV SOLN
INTRAVENOUS | Status: AC
  Filled 2020-12-17: qty 2

## 2020-12-17 MED ORDER — SODIUM CHLORIDE 0.9 % IV SOLN
5.0000 10*6.[IU] | Freq: Once | INTRAVENOUS | Status: AC
  Administered 2020-12-17: 5 10*6.[IU] via INTRAVENOUS
  Filled 2020-12-17: qty 5

## 2020-12-17 MED ORDER — MISOPROSTOL 50MCG HALF TABLET
ORAL_TABLET | ORAL | Status: AC
  Filled 2020-12-17: qty 1

## 2020-12-17 MED ORDER — OXYTOCIN-SODIUM CHLORIDE 30-0.9 UT/500ML-% IV SOLN
INTRAVENOUS | Status: DC | PRN
  Administered 2020-12-17: 30 [IU] via INTRAVENOUS

## 2020-12-17 MED ORDER — OXYTOCIN BOLUS FROM INFUSION: 333.0000 mL | Freq: Once | INTRAVENOUS | Status: DC

## 2020-12-17 MED ORDER — FENTANYL-BUPIVACAINE-NACL 0.5-0.125-0.9 MG/250ML-% EP SOLN
EPIDURAL | Status: AC
  Filled 2020-12-17: qty 250

## 2020-12-17 MED ORDER — TERBUTALINE SULFATE 1 MG/ML IJ SOLN
0.2500 mg | Freq: Once | INTRAMUSCULAR | Status: AC
  Administered 2020-12-17: 0.25 mg via SUBCUTANEOUS

## 2020-12-17 MED ORDER — LIDOCAINE HCL (PF) 1 % IJ SOLN: 30.0000 mL | INTRAMUSCULAR | Status: DC | PRN

## 2020-12-17 MED ORDER — FENTANYL CITRATE (PF) 100 MCG/2ML IJ SOLN
INTRAMUSCULAR | Status: AC
  Filled 2020-12-17: qty 2

## 2020-12-17 MED ORDER — LIDOCAINE HCL (CARDIAC) PF 100 MG/5ML IV SOSY
PREFILLED_SYRINGE | INTRAVENOUS | Status: DC | PRN
  Administered 2020-12-17: 5 mL via INTRAVENOUS

## 2020-12-17 MED ORDER — SODIUM CHLORIDE 0.9 % IV SOLN
500.0000 mg | INTRAVENOUS | Status: AC
  Administered 2020-12-17: 500 mg via INTRAVENOUS

## 2020-12-17 MED ORDER — LACTATED RINGERS IV SOLN: 500.0000 mL | Freq: Once | INTRAVENOUS | Status: AC

## 2020-12-17 MED ORDER — LIDOCAINE HCL (PF) 1 % IJ SOLN
INTRAMUSCULAR | Status: DC | PRN
  Administered 2020-12-17: 10 mL via EPIDURAL

## 2020-12-17 MED ORDER — PENICILLIN G POT IN DEXTROSE 60000 UNIT/ML IV SOLN
3.0000 10*6.[IU] | INTRAVENOUS | Status: DC
  Administered 2020-12-17 (×2): 3 10*6.[IU] via INTRAVENOUS
  Filled 2020-12-17 (×2): qty 50

## 2020-12-17 MED ORDER — EPHEDRINE 5 MG/ML INJ
10.0000 mg | INTRAVENOUS | Status: DC | PRN
  Administered 2020-12-17: 10 mg via INTRAVENOUS

## 2020-12-17 MED ORDER — LACTATED RINGERS AMNIOINFUSION
INTRAVENOUS | Status: DC
Start: 2020-12-17 — End: 2020-12-18

## 2020-12-17 MED ORDER — LACTATED RINGERS IV SOLN: INTRAVENOUS | Status: DC | PRN

## 2020-12-17 MED ORDER — SCOPOLAMINE 1 MG/3DAYS TD PT72
MEDICATED_PATCH | TRANSDERMAL | Status: AC
  Filled 2020-12-17: qty 1

## 2020-12-17 MED ORDER — SODIUM CHLORIDE 0.9 % IV SOLN
INTRAVENOUS | Status: AC
  Filled 2020-12-17: qty 500

## 2020-12-17 MED ORDER — SODIUM CHLORIDE 0.9 % IV SOLN
2.0000 g | INTRAVENOUS | Status: AC
  Administered 2020-12-17: 2 g via INTRAVENOUS
  Filled 2020-12-17: qty 2

## 2020-12-17 MED ORDER — EPHEDRINE 5 MG/ML INJ
10.0000 mg | INTRAVENOUS | Status: DC | PRN
  Filled 2020-12-17: qty 10

## 2020-12-17 MED ORDER — MORPHINE SULFATE (PF) 0.5 MG/ML IJ SOLN
INTRAMUSCULAR | Status: AC
  Filled 2020-12-17: qty 10

## 2020-12-17 MED ORDER — LACTATED RINGERS IV SOLN
500.0000 mL | INTRAVENOUS | Status: DC | PRN
  Administered 2020-12-17: 500 mL via INTRAVENOUS

## 2020-12-17 MED ORDER — DEXAMETHASONE SODIUM PHOSPHATE 4 MG/ML IJ SOLN
INTRAMUSCULAR | Status: DC | PRN
  Administered 2020-12-17: 4 mg via INTRAVENOUS

## 2020-12-17 MED ORDER — OXYTOCIN-SODIUM CHLORIDE 30-0.9 UT/500ML-% IV SOLN: 2.5000 [IU]/h | INTRAVENOUS | Status: DC

## 2020-12-17 MED ORDER — ONDANSETRON HCL 4 MG/2ML IJ SOLN
INTRAMUSCULAR | Status: DC | PRN
  Administered 2020-12-17: 4 mg via INTRAVENOUS

## 2020-12-17 MED ORDER — DEXAMETHASONE SODIUM PHOSPHATE 4 MG/ML IJ SOLN
INTRAMUSCULAR | Status: AC
  Filled 2020-12-17: qty 1

## 2020-12-17 MED ORDER — DIPHENHYDRAMINE HCL 50 MG/ML IJ SOLN: 12.5000 mg | INTRAMUSCULAR | Status: DC | PRN

## 2020-12-17 MED ORDER — ONDANSETRON HCL 4 MG/2ML IJ SOLN
INTRAMUSCULAR | Status: AC
  Filled 2020-12-17: qty 2

## 2020-12-17 MED ORDER — MISOPROSTOL 50MCG HALF TABLET
50.0000 ug | ORAL_TABLET | Freq: Once | ORAL | Status: AC
  Administered 2020-12-17: 50 ug via BUCCAL

## 2020-12-17 MED ORDER — TERBUTALINE SULFATE 1 MG/ML IJ SOLN
INTRAMUSCULAR | Status: AC
  Filled 2020-12-17: qty 1

## 2020-12-17 MED ORDER — ONDANSETRON HCL 4 MG/2ML IJ SOLN: 4.0000 mg | Freq: Four times a day (QID) | INTRAMUSCULAR | Status: DC | PRN

## 2020-12-17 MED ORDER — ACETAMINOPHEN 325 MG PO TABS: 650.0000 mg | ORAL_TABLET | ORAL | Status: DC | PRN

## 2020-12-17 MED ORDER — SODIUM CHLORIDE 0.9 % IR SOLN
Status: DC | PRN
Start: 2020-12-17 — End: 2020-12-18
  Administered 2020-12-17: 1000 mL

## 2020-12-17 MED ORDER — MORPHINE SULFATE (PF) 0.5 MG/ML IJ SOLN
INTRAMUSCULAR | Status: DC | PRN
  Administered 2020-12-17: 3 mg via EPIDURAL

## 2020-12-17 MED ORDER — OXYCODONE-ACETAMINOPHEN 5-325 MG PO TABS: 2.0000 | ORAL_TABLET | ORAL | Status: DC | PRN

## 2020-12-17 MED ORDER — OXYCODONE-ACETAMINOPHEN 5-325 MG PO TABS: 1.0000 | ORAL_TABLET | ORAL | Status: DC | PRN

## 2020-12-17 SURGICAL SUPPLY — 33 items
APL SKNCLS STERI-STRIP NONHPOA (GAUZE/BANDAGES/DRESSINGS) ×1
BENZOIN TINCTURE PRP APPL 2/3 (GAUZE/BANDAGES/DRESSINGS) ×1 IMPLANT
CHLORAPREP W/TINT 26ML (MISCELLANEOUS) ×2 IMPLANT
CLAMP CORD UMBIL (MISCELLANEOUS) IMPLANT
CLOTH BEACON ORANGE TIMEOUT ST (SAFETY) ×2 IMPLANT
CLSR STERI-STRIP ANTIMIC 1/2X4 (GAUZE/BANDAGES/DRESSINGS) ×1 IMPLANT
DRSG OPSITE POSTOP 4X10 (GAUZE/BANDAGES/DRESSINGS) ×2 IMPLANT
ELECT REM PT RETURN 9FT ADLT (ELECTROSURGICAL) ×2
ELECTRODE REM PT RTRN 9FT ADLT (ELECTROSURGICAL) ×1 IMPLANT
EXTRACTOR VACUUM M CUP 4 TUBE (SUCTIONS) IMPLANT
GLOVE BIOGEL PI IND STRL 7.0 (GLOVE) ×3 IMPLANT
GLOVE BIOGEL PI INDICATOR 7.0 (GLOVE) ×3
GLOVE ECLIPSE 7.0 STRL STRAW (GLOVE) ×2 IMPLANT
GOWN STRL REUS W/TWL LRG LVL3 (GOWN DISPOSABLE) ×4 IMPLANT
KIT ABG SYR 3ML LUER SLIP (SYRINGE) IMPLANT
NDL HYPO 25X5/8 SAFETYGLIDE (NEEDLE) ×1 IMPLANT
NEEDLE HYPO 22GX1.5 SAFETY (NEEDLE) ×2 IMPLANT
NEEDLE HYPO 25X5/8 SAFETYGLIDE (NEEDLE) ×2 IMPLANT
NS IRRIG 1000ML POUR BTL (IV SOLUTION) ×2 IMPLANT
PACK C SECTION WH (CUSTOM PROCEDURE TRAY) ×2 IMPLANT
PAD ABD 7.5X8 STRL (GAUZE/BANDAGES/DRESSINGS) ×2 IMPLANT
PAD OB MATERNITY 4.3X12.25 (PERSONAL CARE ITEMS) ×2 IMPLANT
PENCIL SMOKE EVAC W/HOLSTER (ELECTROSURGICAL) ×2 IMPLANT
RTRCTR C-SECT PINK 25CM LRG (MISCELLANEOUS) IMPLANT
SUT PDS AB 0 CTX 36 PDP370T (SUTURE) IMPLANT
SUT PLAIN 2 0 XLH (SUTURE) IMPLANT
SUT VIC AB 0 CTX 36 (SUTURE) ×6
SUT VIC AB 0 CTX36XBRD ANBCTRL (SUTURE) ×3 IMPLANT
SUT VIC AB 4-0 KS 27 (SUTURE) ×2 IMPLANT
SYR CONTROL 10ML LL (SYRINGE) ×2 IMPLANT
TOWEL OR 17X24 6PK STRL BLUE (TOWEL DISPOSABLE) ×2 IMPLANT
TRAY FOLEY W/BAG SLVR 14FR LF (SET/KITS/TRAYS/PACK) ×2 IMPLANT
WATER STERILE IRR 1000ML POUR (IV SOLUTION) ×2 IMPLANT

## 2020-12-17 NOTE — MAU Note (Signed)
Pt reports to mau with c/o irregular ctx since waking up this morning.  Pt also reports losing her mucous plug around 0700 this morning.  Denies LOF.  +FM

## 2020-12-17 NOTE — Progress Notes (Signed)
Patient resting comfortably, epidural in place  Blood pressure 115/66, pulse (!) 104, temperature 98.2 F (36.8 C), temperature source Oral, resp. rate 18, height 5\' 3"  (1.6 m), weight 82.2 kg, last menstrual period 03/11/2020, SpO2 100 %, unknown if currently breastfeeding. EFM: baseline 130bpm/mod variability/+ accels/early+variable deep decels Toco: q2-3 min  CVE: Dilation: 5 Effacement (%): 80 Station: -2 Presentation: Vertex Exam by:: Dr. Harolyn Rutherford MVUs adequate  Still in latent phase, will hold pitocin now Overall reassuring tracing especially given moderate variability Continue close monitoring Hopeful for vaginal delivery   Angela Schneiders, MD, [place], [facility] for [employer], [place]

## 2020-12-17 NOTE — H&P (Signed)
OBSTETRIC ADMISSION HISTORY AND PHYSICAL  Angela Cortez is a 30 y.o. female [phone] with IUP at [redacted]w[redacted]d by 10 wk u/s presenting for contractions/latent labor. She is admitted in the setting of her gestational age. She reports +FMs, No LOF, no VB, no blurry vision, headaches or peripheral edema, and RUQ pain.  She plans on breast feeding. She request depo for birth control. She received her prenatal care at [facility]: By 10 wk u/s --->  Estimated Date of Delivery: 12/16/20  Sono:    08/21/20$Remove'@[redacted]w[redacted]d'BFWDygk$ , CWD, normal anatomy, cephalic presentation, anterior placental lie, 590g, 40% EFW   Prenatal History/Complications:  GBS positive THC use in pregnancy ASCUS pap smear (due 2024) COVID in pregnancy (07/2020)  Past Medical History: Past Medical History:  Diagnosis Date  . Hypertension   . No pertinent past medical history     Past Surgical History: Past Surgical History:  Procedure Laterality Date  . ACROMIO-CLAVICULAR JOINT REPAIR      Obstetrical History: OB History    Gravida  5   Para  2   Term  2   Preterm      AB  2   Living  2     SAB      IAB  2   Ectopic      Multiple  0   Live Births  2           Social History Social History   Socioeconomic History  . Marital status: Single    Spouse name: Not on file  . Number of children: Not on file  . Years of education: Not on file  . Highest education level: Not on file  Occupational History  . Not on file  Tobacco Use  . Smoking status: Former Smoker    Packs/day: 0.25    Types: Cigarettes    Quit date: 02/04/2016    Years since quitting: 4.8  . Smokeless tobacco: Never Used  Vaping Use  . Vaping Use: Former  Substance and Sexual Activity  . Alcohol use: Not Currently    Comment: socially  . Drug use: Yes    Types: Marijuana  . Sexual activity: Not Currently    Partners: Male    Birth control/protection: None    Comment: Pregnant   Other Topics Concern  . Not on file  Social  History Narrative  . Not on file   Social Determinants of Health   Financial Resource Strain: Not on file  Food Insecurity: Not on file  Transportation Needs: Not on file  Physical Activity: Not on file  Stress: Not on file  Social Connections: Not on file    Family History: Family History  Problem Relation Age of Onset  . Diabetes Father   . Hypertension Father   . Cancer Paternal Uncle   . Cancer Paternal Grandmother   . Asthma Mother     Allergies: No Known Allergies  Medications Prior to Admission  Medication Sig Dispense Refill Last Dose  . metroNIDAZOLE (FLAGYL) 500 MG tablet Take 1 tablet (500 mg total) by mouth 2 (two) times daily. 14 tablet 0 12/16/2020 at Unknown time  . Prenatal MV & Min w/FA-DHA (PRENATAL GUMMIES) 0.18-25 MG CHEW Chew by mouth.   12/16/2020 at Unknown time  . Blood Pressure Monitor KIT 1 Device by Does not apply route once a week. To be monitored Regularly at home. (Patient not taking: No sig reported) 1 kit 0      Review of Systems  All systems reviewed and negative except as stated in HPI  Blood pressure 118/75, pulse (!) 101, temperature (!) 97.4 F (36.3 C), resp. rate 18, last menstrual period 03/11/2020, SpO2 100 %, unknown if currently breastfeeding. General appearance: alert, cooperative and no distress Lungs: normal respiratory effort Heart: regular rate and rhythm Abdomen: soft, non-tender; gravid Pelvic: as noted below Extremities: Homans sign is negative, no sign of DVT Presentation: cephalic by RN exam Fetal monitoringBaseline: 150 bpm, Variability: Good {> 6 bpm), Accelerations: Reactive and Decelerations: Absent Uterine activityFrequency: Every 1-5 minutes Dilation: 2 Effacement (%): 50 Station: Ballotable Exam by:: Len Blalock CNM   Prenatal labs: ABO, Rh: O/Positive/-- (10/14 1459) Antibody: Negative (10/14 1459) Rubella: 2.23 (10/14 1459) RPR: Non Reactive (04/05 1403)  HBsAg: Negative (10/14 1459)  HIV: Non  Reactive (04/05 1403)  GBS: Positive/-- (04/12 0958)  2 hr Glucola not done, early a1c nml Genetic screening  normal Anatomy US normal  Prenatal Transfer Tool  Maternal Diabetes: No Genetic Screening: Normal Maternal Ultrasounds/Referrals: Normal Fetal Ultrasounds or other Referrals:  None Maternal Substance Abuse:  Yes:  Type: Marijuana Significant Maternal Medications:  None Significant Maternal Lab Results: Group B Strep positive  No results found for this or any previous visit (from the past 24 hour(s)).  Patient Active Problem List   Diagnosis Date Noted  . Encounter for induction of labor 12/17/2020  . GBS (group B Streptococcus carrier), +RV culture, currently pregnant 11/27/2020  . Ptyalism 07/05/2020  . ASCUS of cervix with negative high risk HPV 05/31/2020  . Obesity in pregnancy 05/28/2020  . Supervision of normal pregnancy, antepartum 04/27/2020  . Mild tetrahydrocannabinol [facility] abuse 10/15/2016    Assessment/Plan:  Angela Cortez is a 30 y.o. L3Y1017 at [redacted]w[redacted]d here for contractions and is likely in latent labor, admitted in the setting of gestational age.  #Labor: Patient contracting every 4-5 min, 2cm in the MAU on exam. Will recheck cervix at 4 hour mark and if unchanged can augment, if changed will continue expectant management. #Pain: PRN #FWB: Cat 1 #ID: GBS+, PCN ordered #MOF: breast #MOC: likely depo #Circ: yes  Arrie Senate, MD  12/17/2020, 10:29 AM

## 2020-12-17 NOTE — Op Note (Signed)
Angela Cortez PROCEDURE DATE: 12/17/2020  PREOPERATIVE DIAGNOSES: Intrauterine pregnancy at [redacted]w[redacted]d weeks gestation; failure to progress: arrest of dilation and non-reassuring fetal status  POSTOPERATIVE DIAGNOSES: The same  PROCEDURE: Primary Low Transverse Cesarean Section  SURGEON:  Dr. Verita Schneiders  ASSISTANT:  Dr. Guillermina City  ANESTHESIOLOGY TEAM: Anesthesiologist: Lidia Collum, MD  INDICATIONS: Angela Cortez is a 30 y.o. [phone] at [redacted]w[redacted]d here for cesarean section secondary to the indications listed under preoperative diagnoses; please see preoperative note for further details.  The risks of surgery were discussed with the patient including but were not limited to: bleeding which may require transfusion or reoperation; infection which may require antibiotics; injury to bowel, bladder, ureters or other surrounding organs; injury to the fetus; need for additional procedures including hysterectomy in the event of a life-threatening hemorrhage; formation of adhesions; placental abnormalities wth subsequent pregnancies; incisional problems; thromboembolic phenomenon and other postoperative/anesthesia complications.  The patient concurred with the proposed plan, giving informed written consent for the procedure.    FINDINGS:  Viable female infant in asynclitic, right occiput posterior, cephalic presentation. Nuchal cord x 1. Apgars 8 and 9.  Clear amniotic fluid.  Intact placenta, three vessel cord.  Normal uterus, fallopian tubes and ovaries bilaterally.  ANESTHESIA: Epidural  INTRAVENOUS FLUIDS: 1200 ml   ESTIMATED BLOOD LOSS: 500 ml URINE OUTPUT:  100 ml SPECIMENS: Placenta sent to L&D COMPLICATIONS: None immediate  PROCEDURE IN DETAIL:  The patient preoperatively received intravenous antibiotics and had sequential compression devices applied to her lower extremities.  She was then taken to the operating room where the epidural anesthesia was dosed up to surgical level and was  found to be adequate. She was then placed in a dorsal supine position with a leftward tilt, and prepped and draped in a sterile manner.  A foley catheter was placed into her bladder and attached to constant gravity.  After an adequate timeout was performed, a Pfannenstiel skin incision was made with scalpel and carried through to the underlying layer of fascia. The fascia was incised in the midline, and this incision was extended bilaterally using the Mayo scissors.  Kocher clamps were applied to the superior aspect of the fascial incision and the underlying rectus muscles were dissected off bluntly and sharply.  A similar process was carried out on the inferior aspect of the fascial incision. The rectus muscles were separated in the midline and the peritoneum was entered bluntly. The Alexis self-retaining retractor was introduced into the abdominal cavity.  Attention was turned to the lower uterine segment where a low transverse hysterotomy was made with a scalpel and extended bilaterally bluntly.  The infant was successfully delivered, the cord was clamped and cut after one minute, and the infant was handed over to the awaiting neonatology team. Uterine massage was then administered, and the placenta delivered intact with a three-vessel cord. The uterus was then cleared of clots and debris.  The hysterotomy was closed with 0 Vicryl in a running locked fashion, and an imbricating layer was also placed with 0 Vicryl.  Figure-of-eight 0 Vicryl serosal stitches were placed to help with hemostasis.  The pelvis was cleared of all clot and debris. Hemostasis was confirmed on all surfaces.  The retractor was removed.  The peritoneum was closed with a 0 Vicryl running stitch. The fascia was then closed using 0 Vicryl in a running fashion.  The subcutaneous layer was irrigated, and the skin was closed with a 4-0 Vicryl subcuticular stitch. The patient tolerated the  procedure well. Sponge, instrument and needle counts were  correct x 3.  She was taken to the recovery room in stable condition.    Verita Schneiders, MD, [place] for [employer], [place]

## 2020-12-17 NOTE — Progress Notes (Signed)
Patient resting comfortably, epidural in place. Having recurrent late FHR decelerations,  Blood pressure 104/63, pulse 95, temperature 98.4 F (36.9 C), temperature source Oral, resp. rate 18, height 5\' 3"  (1.6 m), weight 82.2 kg, last menstrual period 03/11/2020, SpO2 100 %, unknown if currently breastfeeding. EFM: baseline 130bpm/mod variability/+ accels/repetitive deep late decels Toco: q2-3 min  CVE: Dilation: 5 Effacement (%): 80 Station: -2 Presentation: Vertex Exam by:: Dr. Harolyn Rutherford  Cervix edematous, +caput, +asynclitic MVUs adequate  Category 2 FHR tracing, arrest of cervical dilation. Cesarean delivery recommended. The risks of surgery were discussed with the patient including but were not limited to: bleeding which may require transfusion or reoperation; infection which may require antibiotics; injury to bowel, bladder, ureters or other surrounding organs; injury to the fetus; need for additional procedures including hysterectomy in the event of a life-threatening hemorrhage; formation of adhesions; placental abnormalities with subsequent pregnancies; incisional problems; thromboembolic phenomenon and other postoperative/anesthesia complications.  The patient concurred with the proposed plan, giving informed written consent for the procedure.   Anesthesia and OR aware. Preoperative prophylactic antibiotics and SCDs ordered on call to the OR.  To OR when ready.    Verita Schneiders, MD, [place] for [employer], [place]

## 2020-12-17 NOTE — Progress Notes (Signed)
Labor Progress Note BERDENA CISEK is a 30 y.o. [id number] at [redacted]w[redacted]d presented for latent labor, admitted in the setting of gestational age. S: Doing well without complaints.  O:  BP 110/80   Pulse (!) 148   Temp 97.9 F (36.6 C) (Oral)   Resp 18   Ht 5\' 3"  (1.6 m)   Wt 82.2 kg   LMP 03/11/2020   SpO2 100%   BMI 32.10 kg/m  EFM: baseline 120bpm/mod variability/+ accels/no decels Toco: q2-3 min  CVE: Dilation: 5 Effacement (%): 90 Station: -2 Presentation: Vertex Exam by:: Sylvester Harder, MD   A&P: 30 y.o. [id number] [redacted]w[redacted]d presented for latent labor, admitted in the setting of gestational age. #Latent labor: Patient was minimally changed after 4 hours and was previously given cyto x1 due to cervical thickness. SROM @1500 . Patient has made good cervical change since last exam and is in a good contraction pattern, will continue with expectant management. Recheck in 4 hours and start pitocin if unchanged. #Pain: epidural #FWB: cat 1 #GBS positive, PCN, adequate prophylaxis  Arrie Senate, MD 5:39 PM

## 2020-12-17 NOTE — Anesthesia Procedure Notes (Signed)
Epidural Patient location during procedure: OB Start time: 12/17/2020 4:32 PM End time: 12/17/2020 4:52 PM  Staffing Anesthesiologist: Lidia Collum, MD Performed: anesthesiologist   Preanesthetic Checklist Completed: patient identified, IV checked, risks and benefits discussed, monitors and equipment checked, pre-op evaluation and timeout performed  Epidural Patient position: sitting Prep: DuraPrep Patient monitoring: heart rate, continuous pulse ox and blood pressure Approach: midline Location: L3-L4 Injection technique: LOR air  Needle:  Needle type: Tuohy  Needle gauge: 17 G Needle length: 9 cm Needle insertion depth: 7 cm Catheter type: closed end flexible Catheter size: 19 Gauge Catheter at skin depth: 12 cm Test dose: negative  Assessment Events: blood not aspirated, injection not painful, no injection resistance, no paresthesia and negative IV test  Additional Notes Reason for block:procedure for pain

## 2020-12-17 NOTE — Progress Notes (Signed)
Labor Progress Note Angela Cortez is a 30 y.o. [id number] at [redacted]w[redacted]d presented for latent labor, admitted in the setting of gestational age. S: Doing well without complaints.  O:  BP 120/78 (BP Location: Left Arm)   Pulse 90   Temp 97.6 F (36.4 C) (Oral)   Resp 18   Ht 5\' 3"  (1.6 m)   Wt 82.2 kg   LMP 03/11/2020   SpO2 100%   BMI 32.10 kg/m  EFM: baseline 140bpm/mod variability/+ accels/no decels Toco: q4-6 min  CVE: Dilation: 3 Effacement (%): Thick Station: -3 Presentation: Vertex Exam by:: Sylvester Harder, MD   A&P: 30 y.o. Q2V9563 [redacted]w[redacted]d presented for latent labor, admitted in the setting of gestational age. #Latent labor: Patient with some cervical change since last exam, will dose cytotec x1 due to thickness of cervix. Will AROM vs start pitocin at next check. #Pain: PRN #FWB: cat 1 #GBS positive, PCN, adequate prophylaxis  Arrie Senate, MD 1:58 PM

## 2020-12-17 NOTE — Anesthesia Preprocedure Evaluation (Addendum)
Anesthesia Evaluation  Patient identified by MRN, date of birth, ID band Patient awake    Reviewed: Allergy & Precautions, H&P , NPO status , Patient's Chart, lab work & pertinent test results  History of Anesthesia Complications Negative for: history of anesthetic complications  Airway Mallampati: II  TM Distance: >3 FB Neck ROM: full    Dental no notable dental hx.    Pulmonary neg pulmonary ROS, former smoker,    Pulmonary exam normal        Cardiovascular negative cardio ROS Normal cardiovascular exam Rhythm:regular Rate:Normal     Neuro/Psych negative neurological ROS  negative psych ROS   GI/Hepatic negative GI ROS, (+)     substance abuse  marijuana use,   Endo/Other  negative endocrine ROS  Renal/GU negative Renal ROS  negative genitourinary   Musculoskeletal   Abdominal   Peds  Hematology negative hematology ROS (+)   Anesthesia Other Findings   Reproductive/Obstetrics (+) Pregnancy                             Anesthesia Physical Anesthesia Plan  ASA: II and emergent  Anesthesia Plan: Epidural   Post-op Pain Management:    Induction:   PONV Risk Score and Plan:   Airway Management Planned:   Additional Equipment:   Intra-op Plan:   Post-operative Plan:   Informed Consent: I have reviewed the patients History and Physical, chart, labs and discussed the procedure including the risks, benefits and alternatives for the proposed anesthesia with the patient or authorized representative who has indicated his/her understanding and acceptance.       Plan Discussed with:   Anesthesia Plan Comments: (To OR for C/S due to fetal intolerance to labor/arrest of dilation. Functioning labor epidural in place.)       Anesthesia Quick Evaluation

## 2020-12-18 ENCOUNTER — Encounter (HOSPITAL_COMMUNITY): Payer: Self-pay | Admitting: Obstetrics & Gynecology

## 2020-12-18 LAB — RPR: RPR Ser Ql: NONREACTIVE

## 2020-12-18 LAB — CBC
HCT: 30.5 % — ABNORMAL LOW (ref 36.0–46.0)
Hemoglobin: 10.1 g/dL — ABNORMAL LOW (ref 12.0–15.0)
MCH: 31.2 pg (ref 26.0–34.0)
MCHC: 33.1 g/dL (ref 30.0–36.0)
MCV: 94.1 fL (ref 80.0–100.0)
Platelets: 173 10*3/uL (ref 150–400)
RBC: 3.24 MIL/uL — ABNORMAL LOW (ref 3.87–5.11)
RDW: 13.4 % (ref 11.5–15.5)
WBC: 15.8 10*3/uL — ABNORMAL HIGH (ref 4.0–10.5)
nRBC: 0 % (ref 0.0–0.2)

## 2020-12-18 LAB — CREATININE, SERUM
Creatinine, Ser: 0.7 mg/dL (ref 0.44–1.00)
GFR, Estimated: >60 mL/min (ref >60)

## 2020-12-18 MED ORDER — SODIUM CHLORIDE 0.9% FLUSH: 3.0000 mL | INTRAVENOUS | Status: DC | PRN

## 2020-12-18 MED ORDER — MEPERIDINE HCL 25 MG/ML IJ SOLN
6.2500 mg | INTRAMUSCULAR | Status: DC | PRN
Start: 2020-12-18 — End: 2020-12-18

## 2020-12-18 MED ORDER — ONDANSETRON HCL 4 MG/2ML IJ SOLN
4.0000 mg | Freq: Three times a day (TID) | INTRAMUSCULAR | Status: DC | PRN
  Administered 2020-12-18: 4 mg via INTRAVENOUS
  Filled 2020-12-18: qty 2

## 2020-12-18 MED ORDER — OXYCODONE HCL 5 MG PO TABS: 5.0000 mg | ORAL_TABLET | Freq: Once | ORAL | Status: DC | PRN

## 2020-12-18 MED ORDER — COCONUT OIL OIL: 1.0000 "application " | TOPICAL_OIL | Status: DC | PRN

## 2020-12-18 MED ORDER — OXYCODONE-ACETAMINOPHEN 5-325 MG PO TABS
2.0000 | ORAL_TABLET | ORAL | Status: DC | PRN
Start: 2020-12-18 — End: 2020-12-19

## 2020-12-18 MED ORDER — FERROUS SULFATE 325 (65 FE) MG PO TABS: 325.0000 mg | ORAL_TABLET | Freq: Two times a day (BID) | ORAL | Status: DC

## 2020-12-18 MED ORDER — OXYTOCIN-SODIUM CHLORIDE 30-0.9 UT/500ML-% IV SOLN
2.5000 [IU]/h | INTRAVENOUS | Status: AC
  Administered 2020-12-18: 2.5 [IU]/h via INTRAVENOUS
  Filled 2020-12-18: qty 500

## 2020-12-18 MED ORDER — NALBUPHINE HCL 10 MG/ML IJ SOLN: 5.0000 mg | INTRAMUSCULAR | Status: DC | PRN

## 2020-12-18 MED ORDER — NALOXONE HCL 4 MG/10ML IJ SOLN
1.0000 ug/kg/h | INTRAVENOUS | Status: DC | PRN
  Filled 2020-12-18: qty 5

## 2020-12-18 MED ORDER — NALBUPHINE HCL 10 MG/ML IJ SOLN: 5.0000 mg | Freq: Once | INTRAMUSCULAR | Status: DC | PRN

## 2020-12-18 MED ORDER — COMPLETENATE 29-1 MG PO CHEW
1.0000 | CHEWABLE_TABLET | Freq: Every day | ORAL | Status: DC
  Administered 2020-12-18: 1 via ORAL
  Filled 2020-12-18 (×2): qty 1

## 2020-12-18 MED ORDER — FERROUS SULFATE 325 (65 FE) MG PO TABS
325.0000 mg | ORAL_TABLET | ORAL | Status: DC
  Administered 2020-12-18: 325 mg via ORAL
  Filled 2020-12-18: qty 1

## 2020-12-18 MED ORDER — ENOXAPARIN SODIUM 40 MG/0.4ML IJ SOSY
40.0000 mg | PREFILLED_SYRINGE | INTRAMUSCULAR | Status: DC
  Administered 2020-12-18: 40 mg via SUBCUTANEOUS
  Filled 2020-12-18: qty 0.4

## 2020-12-18 MED ORDER — MEASLES, MUMPS & RUBELLA VAC IJ SOLR: 0.5000 mL | Freq: Once | INTRAMUSCULAR | Status: DC

## 2020-12-18 MED ORDER — MAGNESIUM HYDROXIDE 400 MG/5ML PO SUSP: 30.0000 mL | ORAL | Status: DC | PRN

## 2020-12-18 MED ORDER — NALOXONE HCL 0.4 MG/ML IJ SOLN: 0.4000 mg | INTRAMUSCULAR | Status: DC | PRN

## 2020-12-18 MED ORDER — SENNOSIDES-DOCUSATE SODIUM 8.6-50 MG PO TABS
2.0000 | ORAL_TABLET | Freq: Every day | ORAL | Status: DC
  Administered 2020-12-18: 2 via ORAL
  Filled 2020-12-18 (×2): qty 2

## 2020-12-18 MED ORDER — ONDANSETRON HCL 4 MG/2ML IJ SOLN: 4.0000 mg | Freq: Once | INTRAMUSCULAR | Status: DC | PRN

## 2020-12-18 MED ORDER — SIMETHICONE 80 MG PO CHEW
80.0000 mg | CHEWABLE_TABLET | ORAL | Status: DC | PRN
Start: 2020-12-18 — End: 2020-12-19

## 2020-12-18 MED ORDER — HYDROMORPHONE HCL 1 MG/ML IJ SOLN: 0.2500 mg | INTRAMUSCULAR | Status: DC | PRN

## 2020-12-18 MED ORDER — OXYCODONE HCL 5 MG PO TABS: 5.0000 mg | ORAL_TABLET | ORAL | Status: DC | PRN

## 2020-12-18 MED ORDER — SCOPOLAMINE 1 MG/3DAYS TD PT72: 1.0000 | MEDICATED_PATCH | Freq: Once | TRANSDERMAL | Status: DC

## 2020-12-18 MED ORDER — IBUPROFEN 600 MG PO TABS
600.0000 mg | ORAL_TABLET | Freq: Four times a day (QID) | ORAL | Status: DC
  Administered 2020-12-18 – 2020-12-19 (×2): 600 mg via ORAL
  Filled 2020-12-18 (×4): qty 1

## 2020-12-18 MED ORDER — DIBUCAINE (PERIANAL) 1 % EX OINT: 1.0000 "application " | TOPICAL_OINTMENT | CUTANEOUS | Status: DC | PRN

## 2020-12-18 MED ORDER — HYDROMORPHONE HCL 1 MG/ML IJ SOLN: 1.0000 mg | INTRAMUSCULAR | Status: DC | PRN

## 2020-12-18 MED ORDER — DIPHENHYDRAMINE HCL 50 MG/ML IJ SOLN: 12.5000 mg | INTRAMUSCULAR | Status: DC | PRN

## 2020-12-18 MED ORDER — DIPHENHYDRAMINE HCL 25 MG PO CAPS
25.0000 mg | ORAL_CAPSULE | ORAL | Status: DC | PRN
  Administered 2020-12-18: 25 mg via ORAL
  Filled 2020-12-18: qty 1

## 2020-12-18 MED ORDER — KETOROLAC TROMETHAMINE 30 MG/ML IJ SOLN
INTRAMUSCULAR | Status: AC
  Filled 2020-12-18: qty 1

## 2020-12-18 MED ORDER — KETOROLAC TROMETHAMINE 30 MG/ML IJ SOLN
30.0000 mg | Freq: Four times a day (QID) | INTRAMUSCULAR | Status: AC
  Administered 2020-12-18 (×3): 30 mg via INTRAVENOUS
  Filled 2020-12-18 (×3): qty 1

## 2020-12-18 MED ORDER — OXYCODONE HCL 5 MG/5ML PO SOLN: 5.0000 mg | Freq: Once | ORAL | Status: DC | PRN

## 2020-12-18 MED ORDER — KETOROLAC TROMETHAMINE 30 MG/ML IJ SOLN
30.0000 mg | Freq: Four times a day (QID) | INTRAMUSCULAR | Status: AC | PRN
  Administered 2020-12-18: 30 mg via INTRAVENOUS

## 2020-12-18 MED ORDER — ZOLPIDEM TARTRATE 5 MG PO TABS: 5.0000 mg | ORAL_TABLET | Freq: Every evening | ORAL | Status: DC | PRN

## 2020-12-18 MED ORDER — KETOROLAC TROMETHAMINE 30 MG/ML IJ SOLN: 30.0000 mg | Freq: Four times a day (QID) | INTRAMUSCULAR | Status: AC | PRN

## 2020-12-18 MED ORDER — TETANUS-DIPHTH-ACELL PERTUSSIS 5-2.5-18.5 LF-MCG/0.5 IM SUSY: 0.5000 mL | PREFILLED_SYRINGE | Freq: Once | INTRAMUSCULAR | Status: DC

## 2020-12-18 MED ORDER — MENTHOL 3 MG MT LOZG: 1.0000 | LOZENGE | OROMUCOSAL | Status: DC | PRN

## 2020-12-18 MED ORDER — LACTATED RINGERS IV SOLN: INTRAVENOUS | Status: DC

## 2020-12-18 MED ORDER — PRENATAL MULTIVITAMIN CH: 1.0000 | ORAL_TABLET | Freq: Every day | ORAL | Status: DC

## 2020-12-18 MED ORDER — DIPHENHYDRAMINE HCL 25 MG PO CAPS: 25.0000 mg | ORAL_CAPSULE | Freq: Four times a day (QID) | ORAL | Status: DC | PRN

## 2020-12-18 MED ORDER — ACETAMINOPHEN 500 MG PO TABS
1000.0000 mg | ORAL_TABLET | Freq: Four times a day (QID) | ORAL | Status: DC
  Administered 2020-12-18 – 2020-12-19 (×5): 1000 mg via ORAL
  Filled 2020-12-18 (×7): qty 2

## 2020-12-18 MED ORDER — GABAPENTIN 100 MG PO CAPS
300.0000 mg | ORAL_CAPSULE | Freq: Two times a day (BID) | ORAL | Status: DC
  Administered 2020-12-18 (×2): 300 mg via ORAL
  Filled 2020-12-18 (×4): qty 3

## 2020-12-18 MED ORDER — WITCH HAZEL-GLYCERIN EX PADS: 1.0000 "application " | MEDICATED_PAD | CUTANEOUS | Status: DC | PRN

## 2020-12-18 NOTE — Progress Notes (Signed)
POSTPARTUM PROGRESS NOTE  Subjective: Angela Cortez is a 30 y.o. [id number] s/p pLTCS at [redacted]w[redacted]d secondary to fetal intolerance of labor.  She reports she doing well. No acute events overnight. She denies any problems with ambulating or po intake. Denies nausea or vomiting. She has not yet passed flatus. Pain is moderately well controlled.  Lochia is minimal. Foley catheter still in place.  Objective: Blood pressure 113/76, pulse 81, temperature 98.5 F (36.9 C), temperature source Oral, resp. rate (!) 21, height 5\' 3"  (1.6 m), weight 82.2 kg, last menstrual period 03/11/2020, SpO2 93 %, unknown if currently breastfeeding.  Physical Exam:  General: alert, cooperative and no distress Chest: no respiratory distress Abdomen: soft, non-tender  Uterine Fundus: firm and at level of umbilicus Extremities: No calf swelling or tenderness  no LE edema  Recent Labs    12/17/20 1023  HGB 11.8*  HCT 36.8    Assessment/Plan: Angela Cortez is a 30 y.o. [id number] s/p pLTCS at [redacted]w[redacted]d secondary to fetal intolerance of labor.  Routine Postpartum Care: Doing well, pain well-controlled.  -- Continue routine care, lactation support  -- Contraception: undecided--will re-address tomorrow -- Feeding: breast  Dispo: Plan for discharge POD#2-3.  Angela Ngo, MD OB Fellow, [facility] 12/18/2020 2:38 AM
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
## 2020-12-18 NOTE — Transfer of Care (Signed)
Immediate Anesthesia Transfer of Care Note  Patient: Angela Cortez  Procedure(s) Performed: CESAREAN SECTION (N/A )  Patient Location: PACU  Anesthesia Type:Epidural  Level of Consciousness: awake, alert  and oriented  Airway & Oxygen Therapy: Patient Spontanous Breathing  Post-op Assessment: Report given to RN and Post -op Vital signs reviewed and stable  Post vital signs: Reviewed and stable  Last Vitals:  Vitals Value Taken Time  BP    Temp    Pulse    Resp    SpO2      Last Pain:  Vitals:   12/17/20 2215  TempSrc: Oral  PainSc:       Patients Stated Pain Goal: 0 (38/10/17 5102)  Complications: No complications documented.

## 2020-12-18 NOTE — Anesthesia Postprocedure Evaluation (Signed)
Anesthesia Post Note  Patient: Angela Cortez  Procedure(s) Performed: CESAREAN SECTION (N/A )     Patient location during evaluation: Mother Baby Anesthesia Type: Epidural Level of consciousness: awake and alert Pain management: pain level controlled Vital Signs Assessment: post-procedure vital signs reviewed and stable Respiratory status: spontaneous breathing, nonlabored ventilation and respiratory function stable Cardiovascular status: stable Postop Assessment: no headache, no backache and epidural receding Anesthetic complications: no   No complications documented.  Last Vitals:  Vitals:   12/18/20 0354 12/18/20 0448  BP: 106/68 102/70  Pulse: 68 73  Resp: 17 17  Temp: 36.7 C 36.7 C  SpO2: 99% 99%    Last Pain:  Vitals:   12/18/20 0448  TempSrc: Oral  PainSc: 0-No pain   Pain Goal: Patients Stated Pain Goal: 0 (12/17/20 2505)                 Barkley Boards

## 2020-12-18 NOTE — Lactation Note (Signed)
This note was copied from a baby's chart. Lactation Consultation Note  Patient Name: Boy Sheba Whaling [id number] Date: 12/18/2020 Reason for consult: Initial assessment;Term Age:28 hours   P3 mother whose infant is now 17 hours old.  This is a term baby at 40+1 weeks.  Mother did not breast feed her first child but breast fed her second child for 6 months.  Baby was swaddled and asleep in the bassinet when I arrived.  Reviewed breast feeding basics with mother; encouraged a lot of STS.  Offered to unswaddle baby and place him STS and mother interested.  Baby content on mother's chest.  She will feed 8-12 times/24 hours or sooner if baby shows feeding cues.  Suggested mother call her RN/LC for latch assistance as needed.  Mom made aware of O/P services, breastfeeding support groups, community resources, and our phone # for post-discharge questions.  Mother has a DEBP for home use.  No support person present at this time.   Maternal Data Has patient been taught Hand Expression?: Yes Does the patient have breastfeeding experience prior to this delivery?: Yes How long did the patient breastfeed?: 6 months with her second child  Feeding Mother's Current Feeding Choice: Breast Milk and Formula  LATCH Score                    Lactation Tools Discussed/Used    Interventions    Discharge Pump: Personal  Consult Status Consult Status: Follow-up Date: 12/19/20 Follow-up type: In-patient    Carolyne Whitsel R Primus Gritton 12/18/2020, 9:06 AM

## 2020-12-18 NOTE — Anesthesia Postprocedure Evaluation (Signed)
Anesthesia Post Note  Patient: AMBRIE CARTE  Procedure(s) Performed: CESAREAN SECTION (N/A )     Patient location during evaluation: PACU Anesthesia Type: Epidural Level of consciousness: oriented and awake and alert Pain management: pain level controlled Vital Signs Assessment: post-procedure vital signs reviewed and stable Respiratory status: spontaneous breathing, respiratory function stable and nonlabored ventilation Cardiovascular status: blood pressure returned to baseline and stable Postop Assessment: no headache, no backache, no apparent nausea or vomiting and epidural receding Anesthetic complications: no   No complications documented.  Last Vitals:  Vitals:   12/18/20 0354 12/18/20 0448  BP: 106/68 102/70  Pulse: 68 73  Resp: 17 17  Temp: 36.7 C 36.7 C  SpO2: 99% 99%    Last Pain:  Vitals:   12/18/20 0448  TempSrc: Oral  PainSc: 0-No pain   Pain Goal: Patients Stated Pain Goal: 0 (12/17/20 0277)              Epidural/Spinal Function Cutaneous sensation: Able to Wiggle Toes (12/18/20 0448), Patient able to flex knees: Yes (12/18/20 0448), Patient able to lift hips off bed: Yes (12/18/20 0448), Back pain beyond tenderness at insertion site: No (12/18/20 0448), Progressively worsening motor and/or sensory loss: No (12/18/20 0448), Bowel and/or bladder incontinence post epidural: No (12/18/20 0448)  Lidia Collum

## 2020-12-18 NOTE — Addendum Note (Signed)
Addendum  created 12/18/20 0751 by Ignacia Bayley, CRNA   Clinical Note Signed

## 2020-12-18 NOTE — Lactation Note (Signed)
This note was copied from a baby's chart. Lactation Consultation Note Baby 4 hrs old. Attempted to see mom. Lights out, mom soundly sleeping. Asked RN to call for [facility] assistance when mom wakes.  Patient Name: Boy Joclyn Alsobrook [id number] Date: 12/18/2020   Age:28 hours  Maternal Data    Feeding    LATCH Score Latch: Repeated attempts needed to sustain latch, nipple held in mouth throughout feeding, stimulation needed to elicit sucking reflex.  Audible Swallowing: A few with stimulation  Type of Nipple: Everted at rest and after stimulation  Comfort (Breast/Nipple): Soft / non-tender  Hold (Positioning): Assistance needed to correctly position infant at breast and maintain latch.  LATCH Score: 7   Lactation Tools Discussed/Used    Interventions    Discharge    Consult Status      Theodoro Kalata 12/18/2020, 3:47 AM

## 2020-12-19 ENCOUNTER — Encounter (HOSPITAL_COMMUNITY): Payer: Self-pay

## 2020-12-19 MED ORDER — OXYCODONE HCL 5 MG PO TABS: 5.0000 mg | ORAL_TABLET | ORAL | 0 refills | Status: DC | PRN

## 2020-12-19 MED ORDER — ACETAMINOPHEN 500 MG PO TABS
1000.0000 mg | ORAL_TABLET | Freq: Four times a day (QID) | ORAL | 0 refills | Status: DC
Start: 2020-12-19 — End: 2021-10-31

## 2020-12-19 MED ORDER — COCONUT OIL OIL: 1.0000 "application " | TOPICAL_OIL | 0 refills | Status: DC | PRN

## 2020-12-19 MED ORDER — IBUPROFEN 600 MG PO TABS: 600.0000 mg | ORAL_TABLET | Freq: Four times a day (QID) | ORAL | 0 refills | Status: DC

## 2020-12-19 MED ORDER — SIMETHICONE 80 MG PO CHEW: 80.0000 mg | CHEWABLE_TABLET | ORAL | 0 refills | Status: DC | PRN

## 2020-12-19 MED ORDER — POLYETHYLENE GLYCOL 3350 17 G PO PACK: 17.0000 g | PACK | Freq: Every day | ORAL | 0 refills | Status: DC

## 2020-12-19 MED ORDER — MEDROXYPROGESTERONE ACETATE 150 MG/ML IM SUSP
150.0000 mg | Freq: Once | INTRAMUSCULAR | Status: AC
  Administered 2020-12-19: 150 mg via INTRAMUSCULAR
  Filled 2020-12-19: qty 1

## 2020-12-19 MED ORDER — FERROUS SULFATE 325 (65 FE) MG PO TABS: 325.0000 mg | ORAL_TABLET | ORAL | 3 refills | Status: DC

## 2020-12-19 NOTE — Social Work (Signed)
Per [facility] social worker [facility] he will complete an assessment with [facility] at her residence. No barriers to discharge.   Kathrin Greathouse, MSW, LCSW [facility] Worker  [phone] 12/19/2020  1:15 PM

## 2020-12-19 NOTE — Discharge Summary (Addendum)
Postpartum Discharge Summary  Date of Service updated     Patient Name: Angela Cortez DOB: May 09, 1991 MRN: [id number]  Date of admission: 12/17/2020 Delivery date:12/17/2020  Delivering provider: Verita Schneiders A  Date of discharge: 12/19/2020  Admitting diagnosis: Encounter for induction of labor [Z34.90] Intrauterine pregnancy: [redacted]w[redacted]d     Secondary diagnosis:  Active Problems:   Mild tetrahydrocannabinol (THC) abuse   Supervision of normal pregnancy, antepartum   Obesity in pregnancy   ASCUS of cervix with negative high risk HPV   GBS (group B Streptococcus carrier), +RV culture, currently pregnant   Encounter for induction of labor  Additional problems: None    Discharge diagnosis: C-Section for failed IOL                                              Post partum procedures:None Augmentation: Pitocin and Cytotec Complications: None  Hospital course: Onset of Labor With Unplanned C/S   30 y.o. yo [id number] at [redacted]w[redacted]d was admitted in Latent Labor on 12/17/2020. Patient had a labor course significant for PROM. The patient went for cesarean section due to Arrest of Dilation. Delivery details as follows: Membrane Rupture Time/Date: 2:54 PM ,12/17/2020   Delivery Method:C-Section, Low Transverse  Details of operation can be found in separate operative note. Patient had an uncomplicated postpartum course.  She is ambulating,tolerating a regular diet, passing flatus, and urinating well.  Patient is discharged home in stable condition 12/19/20.  Newborn Data: Birth date:12/17/2020  Birth time:11:30 PM  Gender:Female  Living status:Living  Apgars:8 ,8  Weight:3240 g   Magnesium Sulfate received: No BMZ received: No Rhophylac:No MMR:No T-DaP:Declined Flu: No Transfusion:No  Physical exam  Vitals:   12/18/20 0448 12/18/20 0840 12/18/20 2145 12/19/20 0623  BP: 102/70 106/73 103/68 101/65  Pulse: 73 (!) 58 81 79  Resp: $Remo'17 18 18 18  'bCVsp$ Temp: 98.1 F (36.7 C) 97.6 F (36.4 C) 97.8  F (36.6 C) 97.9 F (36.6 C)  TempSrc: Oral Oral Oral Oral  SpO2: 99% 99% 99% 95%  Weight:      Height:       General: alert, cooperative and no distress Lochia: appropriate Uterine Fundus: firm Incision: Healing well with no significant drainage, No significant erythema, Dressing is clean, dry, and intact DVT Evaluation: No evidence of DVT seen on physical exam. Labs: Lab Results  Component Value Date   WBC 15.8 (H) 12/18/2020   HGB 10.1 (L) 12/18/2020   HCT 30.5 (L) 12/18/2020   MCV 94.1 12/18/2020   PLT 173 12/18/2020   CMP Latest Ref Rng & Units 12/18/2020  Glucose 65 - 99 mg/dL -  BUN 6 - 20 mg/dL -  Creatinine 0.44 - 1.00 mg/dL 0.70  Sodium 134 - 144 mmol/L -  Potassium 3.5 - 5.2 mmol/L -  Chloride 96 - 106 mmol/L -  CO2 20 - 29 mmol/L -  Calcium 8.7 - 10.2 mg/dL -  Total Protein 6.0 - 8.5 g/dL -  Total Bilirubin 0.0 - 1.2 mg/dL -  Alkaline Phos 44 - 121 IU/L -  AST 0 - 40 IU/L -  ALT 0 - 32 IU/L -   Edinburgh Score: Edinburgh Postnatal Depression Scale Screening Tool 12/18/2020  I have been able to laugh and see the funny side of things. 0  I have looked forward with enjoyment to things. 0  I  have blamed myself unnecessarily when things went wrong. 1  I have been anxious or worried for no good reason. 0  I have felt scared or panicky for no good reason. 0  Things have been getting on top of me. 0  I have been so unhappy that I have had difficulty sleeping. 0  I have felt sad or miserable. 0  I have been so unhappy that I have been crying. 0  The thought of harming myself has occurred to me. 0  Edinburgh Postnatal Depression Scale Total 1     After visit meds:  Allergies as of 12/19/2020   No Known Allergies     Medication List    STOP taking these medications   Blood Pressure Monitor Kit   metroNIDAZOLE 500 MG tablet Commonly known as: FLAGYL   Prenatal Gummies 0.18-25 MG Chew     TAKE these medications   acetaminophen 500 MG tablet Commonly  known as: TYLENOL Take 2 tablets (1,000 mg total) by mouth every 6 (six) hours.   coconut oil Oil Apply 1 application topically as needed.   ferrous sulfate 325 (65 FE) MG tablet Take 1 tablet (325 mg total) by mouth every other day. Start taking on: Dec 20, 2020   ibuprofen 600 MG tablet Commonly known as: ADVIL Take 1 tablet (600 mg total) by mouth every 6 (six) hours.   oxyCODONE 5 MG immediate release tablet Commonly known as: Oxy IR/ROXICODONE Take 1 tablet (5 mg total) by mouth every 4 (four) hours as needed for moderate pain.   simethicone 80 MG chewable tablet Commonly known as: MYLICON Chew 1 tablet (80 mg total) by mouth as needed for flatulence.        Discharge home in stable condition Infant Feeding: Breast Infant Disposition:home with mother Discharge instruction: per After Visit Summary and Postpartum booklet. Activity: Advance as tolerated. Pelvic rest for 6 weeks.  Diet: routine diet Future Appointments:No future appointments. Follow up Visit:   Please schedule this patient for a In person postpartum visit in 1 week with the following provider: Any provider. Additional Postpartum F/U:Incision check 1 week  Low risk pregnancy complicated by: Failure to Progress Delivery mode:  C-Section, Low Transverse  Anticipated Birth Control:  Depo   12/19/2020 Delora Fuel, MD   Attestation of Attending Supervision of Resident: Evaluation and management procedures were performed by the [facility] Medicine Resident under my supervision. I was immediately available for direct supervision, assistance and direction throughout this encounter.  I also confirm that I have verified the information documented in the resident's note, and that I have also personally reperformed the pertinent components of the physical exam and all of the medical decision making activities.    Katherine Basset, [place] for [employer], [facility] 12/19/2020  1:32 PM

## 2020-12-27 ENCOUNTER — Ambulatory Visit: Payer: Medicaid Other

## 2020-12-29 ENCOUNTER — Other Ambulatory Visit: Payer: Self-pay

## 2020-12-29 ENCOUNTER — Ambulatory Visit (INDEPENDENT_AMBULATORY_CARE_PROVIDER_SITE_OTHER): Payer: Medicaid Other

## 2020-12-29 VITALS — BP 139/91 | HR 61

## 2020-12-29 DIAGNOSIS — Z4889 Encounter for other specified surgical aftercare: Secondary | ICD-10-CM

## 2020-12-29 MED ORDER — AMLODIPINE BESYLATE 5 MG PO TABS: 5.0000 mg | ORAL_TABLET | Freq: Every day | ORAL | 0 refills | Status: DC

## 2020-12-29 MED ORDER — IBUPROFEN 600 MG PO TABS
600.0000 mg | ORAL_TABLET | Freq: Four times a day (QID) | ORAL | 0 refills | Status: DC
Start: 2020-12-29 — End: 2021-10-31

## 2020-12-29 NOTE — Progress Notes (Signed)
Patient was assessed and managed by nursing staff during this encounter. I have reviewed the chart and agree with the documentation and plan. I have also made any necessary editorial changes.  Advised norvasc 5 mg with BP check early next week.  Preeclampsia precautions were given and pt advised to return to [facility] if BP remained elevated or headache worsened.  Griffin Basil, MD 12/29/2020 10:52 AM

## 2020-12-29 NOTE — Progress Notes (Signed)
Pt is in the office for incision check, c-section was on 12-17-20. Removed honeycomb, steri strips in place, pt declined removing them in office. No signs of infection. Advised pt to keep incision clean at home and return to office if abnormal symptoms occur. Pt had elevated BPs of 142/94, 139/91 with ongoing headaches. Sent rx with provider's orders. Pt has f/u appt next week.

## 2021-01-02 ENCOUNTER — Ambulatory Visit: Payer: Medicaid Other

## 2021-01-10 DIAGNOSIS — Z419 Encounter for procedure for purposes other than remedying health state, unspecified: Secondary | ICD-10-CM | POA: Diagnosis not present

## 2021-01-17 ENCOUNTER — Ambulatory Visit (INDEPENDENT_AMBULATORY_CARE_PROVIDER_SITE_OTHER): Payer: Medicaid Other | Admitting: Women's Health

## 2021-01-17 ENCOUNTER — Other Ambulatory Visit: Payer: Self-pay

## 2021-01-17 NOTE — Progress Notes (Signed)
[facility] Part[facility] Visit Note  Angela Cortez is a 30 y.o. [phone] female who presents for a postpartum visit. She is 4 weeks postpartum following a primary cesarean section.  I have fully reviewed the prenatal and intrapartum course. The delivery was at 40.3 gestational weeks.  Anesthesia: epidural. Postpartum course has been uncomplicated. Baby is doing well. Baby is feeding by bottle - Enfamil AR. Bleeding no bleeding. Bowel function is normal. Bladder function is normal. Patient is sexually active. Contraception method is Depo-Provera injections. Postpartum depression screening: negative.  The pregnancy intention screening data noted above was reviewed. Potential methods of contraception were discussed. The patient elected to proceed with Hormonal Injection.    Edinburgh Postnatal Depression Scale - 01/17/21 0951      Edinburgh Postnatal Depression Scale:  In the Past 7 Days   I have been able to laugh and see the funny side of things. 0    I have looked forward with enjoyment to things. 0    I have blamed myself unnecessarily when things went wrong. 0    I have been anxious or worried for no good reason. 0    I have felt scared or panicky for no good reason. 0    Things have been getting on top of me. 0    I have been so unhappy that I have had difficulty sleeping. 0    I have felt sad or miserable. 0    I have been so unhappy that I have been crying. 0    The thought of harming myself has occurred to me. 0    Edinburgh Postnatal Depression Scale Total 0           Health Maintenance Due  Topic Date Due  . COVID-19 Vaccine (1) Never done    The following portions of the patient's history were reviewed and updated as appropriate: allergies, current medications, past family history, past medical history, past social history, past surgical history and problem list.  Review of Systems Pertinent items noted in HPI and remainder of comprehensive ROS otherwise negative.  Objective:   BP (!) 148/103 (BP Location: Right Arm, Cuff Size: Normal)   Pulse 70   Ht 5\' 3"  (1.6 m)   Wt 165 lb 12.8 oz (75.2 kg)   BMI 29.37 kg/m    General:  alert, cooperative and no distress   Breasts:  not indicated  Lungs: clear to auscultation bilaterally  Heart:  regular rate and rhythm, S1, S2 normal, no murmur, click, rub or gallop  Abdomen: soft, non-tender; bowel sounds normal; no masses,  no organomegaly   Wound well approximated incision  GU exam:  not indicated       Assessment:   1. Postpartum exam -hypertension, pt has not yet picked up and started Norvasc, pt encouraged to pick up medication and start today and follow-up with PCP in next month for further evaluation and management  Normal postpartum exam with exception of elevated BP.  Plan:   Essential components of care per ACOG recommendations:  1.  Mood and well being: Patient with negative depression screening today. Reviewed local resources for support.  - Patient tobacco use? Yes. Patient desires to quit? No.   - hx of drug use? No.    2. Infant care and feeding:  -Patient currently breastmilk feeding? No.  -Social determinants of health (SDOH) reviewed in EPIC.  3. Sexuality, contraception and birth spacing - Patient does want a pregnancy in the next year. -  Reviewed forms of contraception in tiered fashion. Patient desired Depo-Provera today.   - Discussed birth spacing of 18 months - Discussed stopping Depo about 6 months before planning to conceive and starting another form of birth control that is instantly reversible in terms of return to fertility  4. Sleep and fatigue -Encouraged family/partner/community support of 4 hrs of uninterrupted sleep to help with mood and fatigue  5. Physical Recovery  - Discussed patients delivery and complications. She describes her labor as good. - Patient had a C/S d/t failure to progress, arrest of dilation, non-reassuring fetal status. Patient had a no laceration.  Perineal healing reviewed. Patient expressed understanding - Patient has urinary incontinence? No. - Patient is safe to resume physical and sexual activity  6.  Health Maintenance - HM due items addressed Yes - Last pap smear  Diagnosis  Date Value Ref Range Status  05/25/2020 (A)  Final   - Atypical squamous cells of undetermined significance (ASC-US)   Pap smear not done at today's visit. -Breast Cancer screening indicated? No.   7. Chronic Disease/Pregnancy Condition follow up: Hypertension - PCP follow up - list given  Clarisa Fling, NP Center for [employer], [place]

## 2021-01-17 NOTE — Patient Instructions (Addendum)
[place] [phone]) . [place] o [address] [place], [place] [address] o [phone] o Mon-Fri 8:30-12:30, 1:30-5:00 o Accepting Medicaid . [place] at [facility] [place], [place], [place] [address] o [phone] o Mon-Fri 8:00-5:30 . [place] [place], [place] [address] o [phone], Tue, Thur, Fri 8:30-5:00, Wed 10:00-7:00 (closed 1-2pm) o Accepting Medicaid . [facility] o [address], [place], [place]  [address] o Phone - [phone]   Fax - [phone]  [place] [phone]) . [place] [place], [place] [address] o [phone] o Mon-Fri 8:00-5:00 . [facility] at [facility])  o [address] [place], [place] [address] o [phone] o Mon-Fri 8:30-5:30, Sat (Oct.-Mar.) 9:00-1:00 o Accepting [facility] [phone]) . [place] at [place], [place], [place] [address] o [phone] o Mon-Fri 8:00-5:00  [place] [phone]) . [place] at [place], [place], [place] [address] o [phone] o Mon-Fri 8:00-5:00 . Therapist, music at [place], [place], [place] [address] o [phone] o Mon-Fri 8:00-5:00 . Therapist, music at [place] [place], [place] [address] o [phone] o Mon-Fri 8:00-5:00 . [place]., [place] [address] o [phone] o Mon-Fri 7:30-5:30  [place] ([place]) . [place] [place], [place] [address] o [phone] o Mon-Thur 8:00-6:00 o Accepting Medicaid . [employer] [place], [place] [address] o [phone] o Mon-Thur 7:30-7:30, Fri 7:30-4:30 o Accepting Medicaid . [place] at [place] [address], [place], [place]  [address] o [phone]   Fax - [place] [phone] & [phone]) . Therapist, music at [place]., [place], [place] [address] o [phone] o Mon-Fri 7:00-5:00 . [place], [place], [place] [address] o [phone] o Mon-Fri 8:00-5:00 o Accepting Medicaid . [place], [place], [place] [address] o [phone] o Mon-Fri 8:00-5:00 o Accepting Medicaid  [facility] [place] [phone]) . [facility] o [place] [place], [place] [address] o [phone] o Mon-Fri 8:00-5:00 . [place] ([place] at [employer]) o [address] [place], [place], [place] [address] o [phone] o Mon-Fri 8:00-5:00 o Accepting Medicaid . [place] ([facility] [employer]) o [address] [place], [place], [place] [address] o [phone] o Mon-Fri 8:00-5:30, Sat&Sun by appointment (phones open at 8:30) o Accepting [facility] [phone] & [phone]) . [facility] o [address] [place], [place] [address] o [phone] o Mon-Thur 8:00-7:00, Fri 8:00-5:00, Sat 8:00-12:00, Sun 9:00-12:00 o Accepting Medicaid . [facility] at [facility] [address], [place], [place] [address] o [phone] o Mon-Thur 8:00-5:00 o Accepting Medicaid . [facility] at [place]., [place],  [address] o [phone] o Mon-Fri 8:00-5:30, Sat (Oct.-Mar.) 9:00-1:00 o Accepting [employer]  [phone]) .  [place] o [address] [place], [place], [place] [address] o [phone] o Mon-Fri 8:00-5:00 o Accepting Medicaid   [facility] [phone]) . [place] at [place], [place], [place] [address] o [phone] o Mon-Fri 8:00-5:00 . Therapist, music at [facility] o [address], [place], [place] [address] o [phone] o Mon-Fri 8:00-5:00 . [place] [address], [place], [place] [address] o [phone] o Mon-Fri 8:00-5:00 o After hours clinic [facility] [place], [place] [address]) [phone] Mon-Fri 5:00-8:00, Sat 12:00-6:00, Sun 10:00-4:00 o Accepting Medicaid . [place] at [facility] o [address], [place], [place]  [address] o [phone]   Fax - [phone]  [place] [phone]) . Therapist, music at [place] o [address], [place], [place] [address] o [phone] o Mon-Fri 8:00-5:00 . [place] ([place]) o [place] [address], [place], [place] [address] o [phone] o Mon-Thur 8:00-7:00, Fri 8:00-5:00, Sat 8:00-12:00           Hypertension, Adult High blood pressure (hypertension) is when the force of blood pumping through the arteries is too strong. The arteries are the blood vessels that carry blood from the heart throughout the body. Hypertension forces the heart to work harder to pump blood and may cause arteries to become narrow or stiff. Untreated or uncontrolled hypertension can cause a heart attack, heart failure, a stroke, kidney disease, and other problems. A blood pressure reading consists of a higher number over a lower number. Ideally, your blood pressure should be below 120/80. The first ("top") number is called the systolic pressure. It is a measure of the pressure in your arteries as your heart beats. The second ("bottom") number is called  the diastolic pressure. It is a measure of the pressure in your arteries as the heart relaxes. What are the causes? The exact cause of this condition is not known. There are some conditions that result in or are related to high blood pressure. What increases the risk? Some risk factors for high blood pressure are under your control. The following factors may make you more likely to develop this condition:  Smoking.  Having type 2 diabetes mellitus, high cholesterol, or both.  Not getting enough exercise or physical activity.  Being overweight.  Having too much fat, sugar, calories, or salt (sodium) in your diet.  Drinking too much alcohol. Some risk factors for high blood pressure may be difficult or impossible to change. Some of these factors include:  Having chronic kidney disease.  Having a family history of high blood pressure.  Age. Risk increases with age.  Race. You may be at higher risk if you are African American.  Gender. Men are at higher risk than women before age 21. After age 70, women are at higher risk than men.  Having obstructive sleep apnea.  Stress. What are the signs or symptoms? High blood pressure may not cause symptoms. Very high blood pressure (hypertensive crisis) may cause:  Headache.  Anxiety.  Shortness of breath.  Nosebleed.  Nausea and vomiting.  Vision changes.  Severe chest pain.  Seizures. How is this diagnosed? This condition is diagnosed by measuring your blood pressure while you are seated, with your arm resting on a flat surface, your legs uncrossed, and your feet flat on the floor. The cuff of the blood pressure  monitor will be placed directly against the skin of your upper arm at the level of your heart. It should be measured at least twice using the same arm. Certain conditions can cause a difference in blood pressure between your right and left arms. Certain factors can cause blood pressure readings to be lower or higher  than normal for a short period of time:  When your blood pressure is higher when you are in a health care provider's office than when you are at home, this is called white coat hypertension. Most people with this condition do not need medicines.  When your blood pressure is higher at home than when you are in a health care provider's office, this is called masked hypertension. Most people with this condition may need medicines to control blood pressure. If you have a high blood pressure reading during one visit or you have normal blood pressure with other risk factors, you may be asked to:  Return on a different day to have your blood pressure checked again.  Monitor your blood pressure at home for 1 week or longer. If you are diagnosed with hypertension, you may have other blood or imaging tests to help your health care provider understand your overall risk for other conditions. How is this treated? This condition is treated by making healthy lifestyle changes, such as eating healthy foods, exercising more, and reducing your alcohol intake. Your health care provider may prescribe medicine if lifestyle changes are not enough to get your blood pressure under control, and if:  Your systolic blood pressure is above 130.  Your diastolic blood pressure is above 80. Your personal target blood pressure may vary depending on your medical conditions, your age, and other factors. Follow these instructions at home: Eating and drinking  Eat a diet that is high in fiber and potassium, and low in sodium, added sugar, and fat. An example eating plan is called the DASH (Dietary Approaches to Stop Hypertension) diet. To eat this way: ? Eat plenty of fresh fruits and vegetables. Try to fill one half of your plate at each meal with fruits and vegetables. ? Eat whole grains, such as whole-wheat pasta, [place] rice, or whole-grain bread. Fill about one fourth of your plate with whole grains. ? Eat or drink low-fat  dairy products, such as skim milk or low-fat yogurt. ? Avoid fatty cuts of meat, processed or cured meats, and poultry with skin. Fill about one fourth of your plate with lean proteins, such as fish, chicken without skin, beans, eggs, or tofu. ? Avoid pre-made and processed foods. These tend to be higher in sodium, added sugar, and fat.  Reduce your daily sodium intake. Most people with hypertension should eat less than 1,500 mg of sodium a day.  Do not drink alcohol if: ? Your health care provider tells you not to drink. ? You are pregnant, may be pregnant, or are planning to become pregnant.  If you drink alcohol: ? Limit how much you use to:  0-1 drink a day for women.  0-2 drinks a day for men. ? Be aware of how much alcohol is in your drink. In the [place] one drink equals one 12 oz bottle of beer (355 mL), one 5 oz glass of wine (148 mL), or one 1 oz glass of hard liquor (44 mL).   Lifestyle  Work with your health care provider to maintain a healthy body weight or to lose weight. Ask what an ideal weight is for you.  Get at least 30 minutes of exercise most days of the week. Activities may include walking, swimming, or biking.  Include exercise to strengthen your muscles (resistance exercise), such as Pilates or lifting weights, as part of your weekly exercise routine. Try to do these types of exercises for 30 minutes at least 3 days a week.  Do not use any products that contain nicotine or tobacco, such as cigarettes, e-cigarettes, and chewing tobacco. If you need help quitting, ask your health care provider.  Monitor your blood pressure at home as told by your health care provider.  Keep all follow-up visits as told by your health care provider. This is important.   Medicines  Take over-the-counter and prescription medicines only as told by your health care provider. Follow directions carefully. Blood pressure medicines must be taken as prescribed.  Do not skip doses of blood  pressure medicine. Doing this puts you at risk for problems and can make the medicine less effective.  Ask your health care provider about side effects or reactions to medicines that you should watch for. Contact a health care provider if you:  Think you are having a reaction to a medicine you are taking.  Have headaches that keep coming back (recurring).  Feel dizzy.  Have swelling in your ankles.  Have trouble with your vision. Get help right away if you:  Develop a severe headache or confusion.  Have unusual weakness or numbness.  Feel faint.  Have severe pain in your chest or abdomen.  Vomit repeatedly.  Have trouble breathing. Summary  Hypertension is when the force of blood pumping through your arteries is too strong. If this condition is not controlled, it may put you at risk for serious complications.  Your personal target blood pressure may vary depending on your medical conditions, your age, and other factors. For most people, a normal blood pressure is less than 120/80.  Hypertension is treated with lifestyle changes, medicines, or a combination of both. Lifestyle changes include losing weight, eating a healthy, low-sodium diet, exercising more, and limiting alcohol. This information is not intended to replace advice given to you by your health care provider. Make sure you discuss any questions you have with your health care provider. Document Revised: 04/08/2018 Document Reviewed: 04/08/2018 Elsevier Patient Education  2021 [employer].

## 2021-02-09 DIAGNOSIS — Z419 Encounter for procedure for purposes other than remedying health state, unspecified: Secondary | ICD-10-CM | POA: Diagnosis not present

## 2021-03-12 DIAGNOSIS — Z419 Encounter for procedure for purposes other than remedying health state, unspecified: Secondary | ICD-10-CM | POA: Diagnosis not present

## 2021-04-12 DIAGNOSIS — Z419 Encounter for procedure for purposes other than remedying health state, unspecified: Secondary | ICD-10-CM | POA: Diagnosis not present

## 2021-05-12 DIAGNOSIS — Z419 Encounter for procedure for purposes other than remedying health state, unspecified: Secondary | ICD-10-CM | POA: Diagnosis not present

## 2021-06-12 DIAGNOSIS — Z419 Encounter for procedure for purposes other than remedying health state, unspecified: Secondary | ICD-10-CM | POA: Diagnosis not present

## 2021-07-12 ENCOUNTER — Ambulatory Visit: Payer: Medicaid Other

## 2021-07-12 DIAGNOSIS — Z419 Encounter for procedure for purposes other than remedying health state, unspecified: Secondary | ICD-10-CM | POA: Diagnosis not present

## 2021-08-12 DIAGNOSIS — Z419 Encounter for procedure for purposes other than remedying health state, unspecified: Secondary | ICD-10-CM | POA: Diagnosis not present

## 2021-09-06 ENCOUNTER — Ambulatory Visit: Payer: Medicaid Other | Admitting: Obstetrics

## 2021-09-12 DIAGNOSIS — Z419 Encounter for procedure for purposes other than remedying health state, unspecified: Secondary | ICD-10-CM | POA: Diagnosis not present

## 2021-09-14 DIAGNOSIS — Z1231 Encounter for screening mammogram for malignant neoplasm of breast: Secondary | ICD-10-CM

## 2021-09-24 ENCOUNTER — Encounter (HOSPITAL_COMMUNITY): Payer: Self-pay

## 2021-09-24 ENCOUNTER — Other Ambulatory Visit: Payer: Self-pay

## 2021-09-24 ENCOUNTER — Ambulatory Visit (HOSPITAL_COMMUNITY)
Admission: EM | Admit: 2021-09-24 | Discharge: 2021-09-24 | Disposition: A | Discharge status: Home / Self Care | Payer: Medicaid Other | Attending: Emergency Medicine | Admitting: Emergency Medicine

## 2021-09-24 DIAGNOSIS — R102 Pelvic and perineal pain: Secondary | ICD-10-CM | POA: Diagnosis not present

## 2021-09-24 MED ORDER — METRONIDAZOLE 0.75 % VA GEL: 1.0000 | Freq: Every day | VAGINAL | 0 refills | Status: DC

## 2021-09-24 NOTE — ED Provider Notes (Signed)
[place]    CSN: [id number] Arrival date & time: 09/24/21  1158      History   Chief Complaint Chief Complaint  Patient presents with   Abdominal Cramping   Abdominal Pain    HPI Angela Cortez is a 31 y.o. female.   Patient presents with suprapubic abdominal cramping and feels as if her left ovary is for 2 days.  Associated thick Manu Rubey vaginal discharge with odor.  Sexually active, 1 partner, no condom use.  Denies vaginal itching, irritation, urinary urgency, frequency, hematuria, dysuria, flank pain, fever chills, antibiotic treatment.  Has not attempted treatment of symptoms.  Does have history of reoccurring BV.     History reviewed. No pertinent past medical history.  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   ASCUS of cervix with negative high risk HPV 05/31/2020   Obesity in pregnancy 05/28/2020   Mild tetrahydrocannabinol (THC) abuse 10/15/2016    Past Surgical History:  Procedure Laterality Date   ACROMIO-CLAVICULAR JOINT REPAIR     CESAREAN SECTION N/A 12/17/2020   Procedure: CESAREAN SECTION;  Surgeon: Osborne Oman, MD;  Location: [facility] LD ORS;  Service: Obstetrics;  Laterality: N/A;  Nonreassuring FHR Tracing Arrest of Dilation    OB History     Gravida  5   Para  2   Term  2   Preterm      AB  2   Living  2      SAB      IAB  2   Ectopic      Multiple  0   Live Births  2            Home Medications    Prior to Admission medications   Medication Sig Start Date End Date Taking? Authorizing Provider  acetaminophen (TYLENOL) 500 MG tablet Take 2 tablets (1,000 mg total) by mouth every 6 (six) hours. Patient not taking: Reported on 12/29/2020 12/19/20   Delora Fuel, MD  amLODipine (NORVASC) 5 MG tablet Take 1 tablet (5 mg total) by mouth daily. Patient not taking: Reported on 01/17/2021 12/29/20   Griffin Basil, MD  coconut oil OIL Apply 1 application topically as  needed. Patient not taking: Reported on 12/29/2020 12/19/20   Delora Fuel, MD  ferrous sulfate 325 (65 FE) MG tablet Take 1 tablet (325 mg total) by mouth every other day. Patient not taking: Reported on 01/17/2021 12/20/20   Delora Fuel, MD  ibuprofen (ADVIL) 600 MG tablet Take 1 tablet (600 mg total) by mouth every 6 (six) hours. 12/29/20   Griffin Basil, MD  oxyCODONE (OXY IR/ROXICODONE) 5 MG immediate release tablet Take 1 tablet (5 mg total) by mouth every 4 (four) hours as needed for moderate pain. Patient not taking: Reported on 12/29/2020 12/19/20   Delora Fuel, MD  polyethylene glycol (MIRALAX / GLYCOLAX) 17 g packet Take 17 g by mouth daily. Patient not taking: Reported on 12/29/2020 12/19/20   Delora Fuel, MD  simethicone (MYLICON) 80 MG chewable tablet Chew 1 tablet (80 mg total) by mouth as needed for flatulence. Patient not taking: Reported on 12/29/2020 12/19/20   Delora Fuel, MD    Family History Family History  Problem Relation Age of Onset   Diabetes Father    Hypertension Father    Cancer Paternal Uncle    Cancer Paternal Grandmother    Asthma Mother     Social History Social History   Tobacco  Use   Smoking status: Former    Packs/day: 0.25    Types: Cigarettes    Quit date: 02/04/2016    Years since quitting: 5.6   Smokeless tobacco: Never  Vaping Use   Vaping Use: Former  Substance Use Topics   Alcohol use: Not Currently    Comment: socially   Drug use: Yes    Types: Marijuana     Allergies   Patient has no known allergies.   Review of Systems Review of Systems  Constitutional: Negative.   Respiratory: Negative.    Cardiovascular: Negative.   Gastrointestinal:  Positive for abdominal distention and abdominal pain. Negative for anal bleeding, blood in stool, constipation, diarrhea, nausea, rectal pain and vomiting.  Genitourinary:  Positive for vaginal discharge. Negative for decreased urine volume, difficulty urinating, dyspareunia,  dysuria, enuresis, flank pain, frequency, genital sores, hematuria, menstrual problem, pelvic pain, urgency, vaginal bleeding and vaginal pain.  Skin: Negative.     Physical Exam Triage Vital Signs ED Triage Vitals  Enc Vitals Group     BP 09/24/21 1258 132/87     Pulse Rate 09/24/21 1258 82     Resp 09/24/21 1258 18     Temp 09/24/21 1258 98.3 F (36.8 C)     Temp Source 09/24/21 1258 Oral     SpO2 09/24/21 1258 98 %     Weight --      Height --      Head Circumference --      Peak Flow --      Pain Score 09/24/21 1300 0     Pain Loc --      Pain Edu? --      Excl. in [place]? --    No data found.  Updated Vital Signs BP 132/87 (BP Location: Left Arm)    Pulse 82    Temp 98.3 F (36.8 C) (Oral)    Resp 18    LMP 09/21/2021 (Approximate)    SpO2 98%   Visual Acuity Right Eye Distance:   Left Eye Distance:   Bilateral Distance:    Right Eye Near:   Left Eye Near:    Bilateral Near:     Physical Exam Constitutional:      Appearance: She is well-developed.  HENT:     Head: Normocephalic.  Eyes:     Extraocular Movements: Extraocular movements intact.  Cardiovascular:     Rate and Rhythm: Normal rate.  Pulmonary:     Effort: Pulmonary effort is normal.  Abdominal:     General: Abdomen is flat. Bowel sounds are normal.     Palpations: Abdomen is soft.     Tenderness: There is abdominal tenderness in the suprapubic area. There is no right CVA tenderness or left CVA tenderness.  Genitourinary:    Comments: deferred Skin:    General: Skin is warm and dry.  Neurological:     General: No focal deficit present.     Mental Status: She is alert and oriented to person, place, and time.  Psychiatric:        Mood and Affect: Mood normal.        Behavior: Behavior normal.     UC Treatments / Results  Labs (all labs ordered are listed, but only abnormal results are displayed) Labs Reviewed - No data to display  EKG   Radiology No results  found.  Procedures Procedures (including critical care time)  Medications Ordered in UC Medications - No data to display  Initial Impression /  Assessment and Plan / UC Course  I have reviewed the triage vital signs and the nursing notes.  Pertinent labs & imaging results that were available during my care of the patient were reviewed by me and considered in my medical decision making (see chart for details).  Suprapubic abdominal pain  We will treat prophylactically for bacterial vaginosis, MetroGel 5-day course prescribed as patient endorses she is unable to tolerate metronidazole tablets, STI screening pending, will treat per protocol, advised abstinence until lab results, treatment is complete and all symptoms have cleared, may follow-up with urgent care as needed, recommended condom use during sexual encounters moving forward Final Clinical Impressions(s) / UC Diagnoses   Final diagnoses:  None   Discharge Instructions   None    ED Prescriptions   None    PDMP not reviewed this encounter.   Hans Eden, NP 09/24/21 1325

## 2021-09-24 NOTE — Discharge Instructions (Addendum)
Today you are being treated prophylactically for  Bacterial vaginosis    Use MetroGel once a day before bed for 5 days  Bacterial vaginosis which results from an overgrowth of one on several organisms that are normally present in your vagina. Vaginosis is an inflammation of the vagina that can result in discharge, itching and pain.  Labs pending 2-3 days, you will be contacted if positive for any sti and treatment will be sent to the pharmacy, you will have to return to the clinic if positive for gonorrhea to receive treatment   Please refrain from having sex until labs results, if positive please refrain from having sex until treatment complete and symptoms resolve   If positive for HIV, Syphilis, Chlamydia  gonorrhea or trichomoniasis please notify partner or partners so they may tested as well  Moving forward, it is recommended you use some form of protection against the transmission of sti infections  such as condoms or dental dams with each sexual encounter     In addition: Avoid baths, hot tubs and whirlpool spas.  Don't use scented or harsh soaps Avoid irritants. These include scented tampons and pads. Wipe from front to back after using the toilet. Don't douche. Your vagina doesn't require cleansing other than normal bathing.  Use a condom.  Wear cotton underwear, this fabric absorbs some moisture.

## 2021-09-24 NOTE — ED Triage Notes (Signed)
Pt presents to the office for abdominal cramping and pain x 2 days. Pt thinks she has BV and would like to be check.

## 2021-09-24 NOTE — ED Triage Notes (Signed)
Pt does c/o pain in her left groin area.

## 2021-09-25 LAB — CERVICOVAGINAL ANCILLARY ONLY
Bacterial Vaginitis (gardnerella): NEGATIVE
Candida Glabrata: NEGATIVE
Candida Vaginitis: NEGATIVE
Chlamydia: NEGATIVE
Comment: NEGATIVE
Comment: NEGATIVE
Comment: NEGATIVE
Comment: NEGATIVE
Comment: NEGATIVE
Comment: NORMAL
Neisseria Gonorrhea: NEGATIVE
Trichomonas: POSITIVE — AB

## 2021-09-26 ENCOUNTER — Telehealth (HOSPITAL_COMMUNITY): Payer: Self-pay | Admitting: Emergency Medicine

## 2021-09-26 ENCOUNTER — Ambulatory Visit: Payer: Medicaid Other | Admitting: Obstetrics

## 2021-09-26 MED ORDER — METRONIDAZOLE 500 MG PO TABS: 500.0000 mg | ORAL_TABLET | Freq: Two times a day (BID) | ORAL | 0 refills | Status: DC

## 2021-10-04 ENCOUNTER — Telehealth (HOSPITAL_COMMUNITY): Payer: Self-pay | Admitting: Family Medicine

## 2021-10-04 MED ORDER — FLUCONAZOLE 150 MG PO TABS: 150.0000 mg | ORAL_TABLET | Freq: Once | ORAL | 0 refills | Status: AC

## 2021-10-04 NOTE — Telephone Encounter (Signed)
Fluconazole sent for poss yeast infection. Tx'd with flagyl last week or so.

## 2021-10-10 DIAGNOSIS — Z419 Encounter for procedure for purposes other than remedying health state, unspecified: Secondary | ICD-10-CM | POA: Diagnosis not present

## 2021-10-11 ENCOUNTER — Encounter: Payer: Medicaid Other | Admitting: Obstetrics and Gynecology

## 2021-10-31 ENCOUNTER — Encounter (HOSPITAL_COMMUNITY): Payer: Self-pay | Admitting: Emergency Medicine

## 2021-10-31 ENCOUNTER — Emergency Department (HOSPITAL_COMMUNITY)
Admission: EM | Admit: 2021-10-31 | Discharge: 2021-10-31 | Disposition: A | Discharge status: Home / Self Care | Payer: Medicaid Other | Attending: Emergency Medicine | Admitting: Emergency Medicine

## 2021-10-31 DIAGNOSIS — N898 Other specified noninflammatory disorders of vagina: Secondary | ICD-10-CM

## 2021-10-31 DIAGNOSIS — N760 Acute vaginitis: Secondary | ICD-10-CM | POA: Diagnosis not present

## 2021-10-31 DIAGNOSIS — B9689 Other specified bacterial agents as the cause of diseases classified elsewhere: Secondary | ICD-10-CM

## 2021-10-31 DIAGNOSIS — R1030 Lower abdominal pain, unspecified: Secondary | ICD-10-CM | POA: Diagnosis not present

## 2021-10-31 DIAGNOSIS — Z79899 Other long term (current) drug therapy: Secondary | ICD-10-CM | POA: Insufficient documentation

## 2021-10-31 LAB — WET PREP, GENITAL
Sperm: NONE SEEN
Trich, Wet Prep: NONE SEEN
WBC, Wet Prep HPF POC: <10 (ref <10)
Yeast Wet Prep HPF POC: NONE SEEN

## 2021-10-31 LAB — URINALYSIS, ROUTINE W REFLEX MICROSCOPIC
Bilirubin Urine: NEGATIVE
Glucose, UA: NEGATIVE mg/dL
Hgb urine dipstick: NEGATIVE
Ketones, ur: NEGATIVE mg/dL
Nitrite: NEGATIVE
Protein, ur: NEGATIVE mg/dL
Specific Gravity, Urine: 1.02 (ref 1.005–1.030)
pH: 5 (ref 5.0–8.0)

## 2021-10-31 LAB — HIV ANTIBODY (ROUTINE TESTING W REFLEX): HIV Screen 4th Generation wRfx: NONREACTIVE

## 2021-10-31 LAB — PREGNANCY, URINE: Preg Test, Ur: NEGATIVE

## 2021-10-31 MED ORDER — METRONIDAZOLE 500 MG PO TABS: 500.0000 mg | ORAL_TABLET | Freq: Two times a day (BID) | ORAL | 0 refills | Status: DC

## 2021-10-31 MED ORDER — DOXYCYCLINE HYCLATE 100 MG PO CAPS: 100.0000 mg | ORAL_CAPSULE | Freq: Two times a day (BID) | ORAL | 0 refills | Status: DC

## 2021-10-31 MED ORDER — CEFTRIAXONE SODIUM 500 MG IJ SOLR
500.0000 mg | Freq: Once | INTRAMUSCULAR | Status: AC
  Administered 2021-10-31: 500 mg via INTRAMUSCULAR
  Filled 2021-10-31: qty 500

## 2021-10-31 NOTE — ED Triage Notes (Signed)
Patient complains of abdominal cramping that feel simliar to the way she felt before she was treated for trichomonas last month, patient states she  currently has a yeast infection. Patient is alert, oriented, ambulatory, and in no apparent distress at this time. ?

## 2021-10-31 NOTE — ED Provider Notes (Signed)
?[facility] ?Provider Note ? ? ?CSN: [id number] ?Arrival date & time: 10/31/21  1109 ? ?  ? ?History ? ?Chief Complaint  ?Patient presents with  ? Abdominal Pain  ? ? ?Angela Cortez is a 31 y.o. female. ? ?The history is provided by medical records. No language interpreter was used.  ?Abdominal Pain ? ?31 year old female presenting with complaints of abdominal pain.  Patient report for the past 2 to 3 days she has had lower abdominal cramping.  She also noticed increased vaginal discharge with slight odor and itchiness.  Cramping is mild to moderate.  No associated fever chills no nausea vomiting diarrhea no change in diet no dysuria and no vaginal bleeding.  States last time that she had similar abdominal cramping she test positive for 2 different types of STI.  She is still sexually active with same partner.  She is unable to tell me her last menstruation.  ? ?Home Medications ?Prior to Admission medications   ?Medication Sig Start Date End Date Taking? Authorizing Provider  ?acetaminophen (TYLENOL) 500 MG tablet Take 2 tablets (1,000 mg total) by mouth every 6 (six) hours. ?Patient not taking: Reported on 12/29/2020 12/19/20   Delora Fuel, MD  ?amLODipine (NORVASC) 5 MG tablet Take 1 tablet (5 mg total) by mouth daily. ?Patient not taking: Reported on 01/17/2021 12/29/20   Griffin Basil, MD  ?coconut oil OIL Apply 1 application topically as needed. ?Patient not taking: Reported on 12/29/2020 12/19/20   Delora Fuel, MD  ?ferrous sulfate 325 (65 FE) MG tablet Take 1 tablet (325 mg total) by mouth every other day. ?Patient not taking: Reported on 01/17/2021 12/20/20   Delora Fuel, MD  ?ibuprofen (ADVIL) 600 MG tablet Take 1 tablet (600 mg total) by mouth every 6 (six) hours. 12/29/20   Griffin Basil, MD  ?metroNIDAZOLE (FLAGYL) 500 MG tablet Take 1 tablet (500 mg total) by mouth 2 (two) times daily. 09/26/21   Chase Picket, MD  ?metroNIDAZOLE (METROGEL VAGINAL) 0.75  % vaginal gel Place 1 Applicatorful vaginally at bedtime. 09/24/21   Hans Eden, NP  ?oxyCODONE (OXY IR/ROXICODONE) 5 MG immediate release tablet Take 1 tablet (5 mg total) by mouth every 4 (four) hours as needed for moderate pain. ?Patient not taking: Reported on 12/29/2020 12/19/20   Delora Fuel, MD  ?polyethylene glycol (MIRALAX / GLYCOLAX) 17 g packet Take 17 g by mouth daily. ?Patient not taking: Reported on 12/29/2020 12/19/20   Delora Fuel, MD  ?simethicone (MYLICON) 80 MG chewable tablet Chew 1 tablet (80 mg total) by mouth as needed for flatulence. ?Patient not taking: Reported on 12/29/2020 12/19/20   Delora Fuel, MD  ?   ? ?Allergies    ?Patient has no known allergies.   ? ?Review of Systems   ?Review of Systems  ?Gastrointestinal:  Positive for abdominal pain.  ?All other systems reviewed and are negative. ? ?Physical Exam ?Updated Vital Signs ?BP 128/89 (BP Location: Left Arm)   Pulse 76   Temp 97.8 ?F (36.6 ?C) (Oral)   Resp 18   SpO2 100%  ?Physical Exam ?Vitals and nursing note reviewed.  ?Constitutional:   ?   General: She is not in acute distress. ?   Appearance: She is well-developed.  ?HENT:  ?   Head: Atraumatic.  ?Eyes:  ?   Conjunctiva/sclera: Conjunctivae normal.  ?Pulmonary:  ?   Effort: Pulmonary effort is normal.  ?Abdominal:  ?   General: Abdomen is flat.  ?  Tenderness: There is no abdominal tenderness.  ?Genitourinary: ?   Comments: Exam is deferred ?Musculoskeletal:  ?   Cervical back: Neck supple.  ?Skin: ?   Findings: No rash.  ?Neurological:  ?   Mental Status: She is alert.  ?Psychiatric:     ?   Mood and Affect: Mood normal.  ? ? ?ED Results / Procedures / Treatments   ?Labs ?(all labs ordered are listed, but only abnormal results are displayed) ?Labs Reviewed  ?WET PREP, GENITAL - Abnormal; Notable for the following components:  ?    Result Value  ? Clue Cells Wet Prep HPF POC PRESENT (*)   ? All other components within normal limits  ?URINALYSIS, ROUTINE W REFLEX  MICROSCOPIC - Abnormal; Notable for the following components:  ? Color, Urine AMBER (*)   ? APPearance CLOUDY (*)   ? Leukocytes,Ua TRACE (*)   ? Bacteria, UA RARE (*)   ? All other components within normal limits  ?HIV ANTIBODY (ROUTINE TESTING W REFLEX)  ?PREGNANCY, URINE  ?GC/CHLAMYDIA PROBE AMP ([place]) NOT AT [facility]  ? ? ?EKG ?None ? ?Radiology ?No results found. ? ?Procedures ?Procedures  ? ? ?Medications Ordered in ED ?Medications  ?cefTRIAXone (ROCEPHIN) injection 500 mg (has no administration in time range)  ? ? ?ED Course/ Medical Decision Making/ A&P ?  ?                        ?Medical Decision Making ? ?BP 128/89 (BP Location: Left Arm)   Pulse 76   Temp 97.8 ?F (36.6 ?C) (Oral)   Resp 18   SpO2 100%  ? ?2:39 PM ?This is a 31 year old female with prior history of STI presenting requesting to be screened for potential STI.  She states she is having some lower abdominal cramping and some vaginal discharge that felt similar to prior STI.  She mention she usually self swab at the urgent care center for her complaint and prefers to do the same during this visit.  She denies any significant vaginal rash.  Pain is minimal on exam.  After discussing risk and benefit, I have agreed to have patient self swab to screen for STI.  I will prophylactically treat her with Rocephin. ? ?3:18 PM ?Wet prep positive for clue cells and symptom is suggestive of BV.  We will treat with metronidazole.  Since patient have lower abdominal discomfort and concerns for STI, she did receive Rocephin and I will discharge her home with doxycycline to cover for potential cervicitis.  I have considered appendicitis, colitis, diverticulitis, PID, TOA, ovarian torsion, ectopic pregnancy however felt that these are less likely to be the cause.   ? ? ? ? ? ? ? ?Final Clinical Impression(s) / ED Diagnoses ?Final diagnoses:  ?Vaginal discharge  ?Lower abdominal pain  ?BV (bacterial vaginosis)  ? ? ?Rx / DC Orders ?ED Discharge Orders    ? ?      Ordered  ?  metroNIDAZOLE (FLAGYL) 500 MG tablet  2 times daily       ? 10/31/21 1518  ?  doxycycline (VIBRAMYCIN) 100 MG capsule  2 times daily       ? 10/31/21 1518  ? ?  ?  ? ?  ? ? ?  ?Domenic Moras, PA-C ?10/31/21 1523 ? ?  ?Godfrey Pick, MD ?10/31/21 1843 ? ?

## 2021-10-31 NOTE — ED Notes (Signed)
Pt states she usually self swabs at UC.  ?

## 2021-10-31 NOTE — ED Provider Triage Note (Signed)
Emergency Medicine Provider Triage Evaluation Note ? ?Angela Cortez , a 31 y.o. female  was evaluated in triage.  Pt complains of concern for yeast infection and bacterial vaginosis.  Says it feels the exact same way it did a year ago when she tested positive for multiple STDs.  Believes her partner has some STD that he is not telling her about.  Last menstrual period the beginning of last month. ? ? ? ?Review of Systems  ?Positive: Abnormal vaginal discharge, odor and cramping ?Negative: Urinary symptoms ? ?Physical Exam  ?BP 135/90 (BP Location: Left Arm)   Pulse 88   Temp 97.8 ?F (36.6 ?C) (Oral)   Resp 18   SpO2 100%  ?Gen:   Awake, no distress   ?Resp:  Normal effort  ?MSK:   Moves extremities without difficulty  ?Other:   ? ?Medical Decision Making  ?Medically screening exam initiated at 11:42 AM.  Appropriate orders placed.  Angela Cortez was informed that the remainder of the evaluation will be completed by another provider, this initial triage assessment does not replace that evaluation, and the importance of remaining in the ED until their evaluation is complete. ? ? ?  ?Rhae Hammock, PA-C ?10/31/21 1143 ? ?

## 2021-10-31 NOTE — Discharge Instructions (Signed)
You have been evaluated for potential sexually transmitted infection.  You perform a self swab and shows evidence of bacterial vaginosis.  Please take metronidazole as prescribed.  A Gonorrhea and Chlamydia test have been sent, you may check the result through MyChart, link below.  If positive, you will be contacted with appropriate treatment.  If you develop worsening abdominal pain fever or you have any concern please return for reassessment ?

## 2021-10-31 NOTE — ED Notes (Signed)
Discharge instructions reviewed with patient. Patient verbalized understanding of instructions. Follow-up care and medications were reviewed. Patient ambulatory with steady gait. VSS upon discharge.  ?

## 2021-11-01 LAB — GC/CHLAMYDIA PROBE AMP (~~LOC~~) NOT AT ARMC
Chlamydia: NEGATIVE
Comment: NEGATIVE
Comment: NORMAL
Neisseria Gonorrhea: NEGATIVE

## 2021-11-10 DIAGNOSIS — Z419 Encounter for procedure for purposes other than remedying health state, unspecified: Secondary | ICD-10-CM | POA: Diagnosis not present

## 2021-12-10 DIAGNOSIS — Z419 Encounter for procedure for purposes other than remedying health state, unspecified: Secondary | ICD-10-CM | POA: Diagnosis not present

## 2022-01-10 DIAGNOSIS — Z419 Encounter for procedure for purposes other than remedying health state, unspecified: Secondary | ICD-10-CM | POA: Diagnosis not present

## 2022-02-06 ENCOUNTER — Encounter (HOSPITAL_COMMUNITY): Payer: Self-pay

## 2022-02-06 ENCOUNTER — Ambulatory Visit (HOSPITAL_COMMUNITY)
Admission: EM | Admit: 2022-02-06 | Discharge: 2022-02-06 | Disposition: A | Discharge status: Home / Self Care | Payer: Medicaid Other | Attending: Physician Assistant | Admitting: Physician Assistant

## 2022-02-06 DIAGNOSIS — B9689 Other specified bacterial agents as the cause of diseases classified elsewhere: Secondary | ICD-10-CM | POA: Diagnosis not present

## 2022-02-06 DIAGNOSIS — N760 Acute vaginitis: Secondary | ICD-10-CM | POA: Insufficient documentation

## 2022-02-06 MED ORDER — METRONIDAZOLE 0.75 % VA GEL: 1.0000 | Freq: Every day | VAGINAL | 0 refills | Status: AC

## 2022-02-06 NOTE — ED Triage Notes (Signed)
Pt c/o vaginal odor x3 days. Pt requesting std testing and treatment.

## 2022-02-06 NOTE — Discharge Instructions (Signed)
Use metrogel as prescribed Will call with lab results and treat if indicated

## 2022-02-07 LAB — CERVICOVAGINAL ANCILLARY ONLY
Bacterial Vaginitis (gardnerella): POSITIVE — AB
Candida Glabrata: NEGATIVE
Candida Vaginitis: NEGATIVE
Chlamydia: NEGATIVE
Comment: NEGATIVE
Comment: NEGATIVE
Comment: NEGATIVE
Comment: NEGATIVE
Comment: NEGATIVE
Comment: NORMAL
Neisseria Gonorrhea: NEGATIVE
Trichomonas: NEGATIVE

## 2022-02-09 DIAGNOSIS — Z419 Encounter for procedure for purposes other than remedying health state, unspecified: Secondary | ICD-10-CM | POA: Diagnosis not present

## 2022-03-12 DIAGNOSIS — Z419 Encounter for procedure for purposes other than remedying health state, unspecified: Secondary | ICD-10-CM | POA: Diagnosis not present

## 2022-03-19 ENCOUNTER — Ambulatory Visit (HOSPITAL_COMMUNITY)
Admission: EM | Admit: 2022-03-19 | Discharge: 2022-03-19 | Disposition: A | Discharge status: Home / Self Care | Payer: Medicaid Other | Attending: Family Medicine | Admitting: Family Medicine

## 2022-03-19 ENCOUNTER — Encounter (HOSPITAL_COMMUNITY): Payer: Self-pay | Admitting: *Deleted

## 2022-03-19 DIAGNOSIS — N750 Cyst of Bartholin's gland: Secondary | ICD-10-CM | POA: Insufficient documentation

## 2022-03-19 DIAGNOSIS — N898 Other specified noninflammatory disorders of vagina: Secondary | ICD-10-CM | POA: Insufficient documentation

## 2022-03-19 DIAGNOSIS — R350 Frequency of micturition: Secondary | ICD-10-CM | POA: Insufficient documentation

## 2022-03-19 DIAGNOSIS — R8290 Unspecified abnormal findings in urine: Secondary | ICD-10-CM | POA: Diagnosis not present

## 2022-03-19 LAB — POCT URINALYSIS DIPSTICK, ED / UC
Bilirubin Urine: NEGATIVE
Glucose, UA: NEGATIVE mg/dL
Ketones, ur: NEGATIVE mg/dL
Nitrite: NEGATIVE
Protein, ur: 100 mg/dL — AB
Specific Gravity, Urine: 1.02 (ref 1.005–1.030)
Urobilinogen, UA: 0.2 mg/dL (ref 0.0–1.0)
pH: 7 (ref 5.0–8.0)

## 2022-03-19 LAB — POC URINE PREG, ED: Preg Test, Ur: NEGATIVE

## 2022-03-19 MED ORDER — METRONIDAZOLE 0.75 % VA GEL: 1.0000 | Freq: Every day | VAGINAL | 0 refills | Status: DC

## 2022-03-19 MED ORDER — SULFAMETHOXAZOLE-TRIMETHOPRIM 800-160 MG PO TABS: 1.0000 | ORAL_TABLET | Freq: Two times a day (BID) | ORAL | 0 refills | Status: AC

## 2022-03-19 NOTE — ED Provider Notes (Signed)
[facility]    CSN: [id number] Arrival date & time: 03/19/22  1809      History   Chief Complaint Chief Complaint  Patient presents with   Abdominal Pain    HPI Angela Cortez is a 31 y.o. female.   Patient presents today with a weeklong history of malodorous urine.  She reports associated vaginal odor and discharge but states that she noticed that the urine also has a strong odor.  She reports frequency and urgency but denies dysuria.  Does report some lower abdominal cramping but denies any significant pain or pelvic pain.  Denies any fever, nausea, vomiting.  She denies history of recurrent UTI, nephrolithiasis, recent urogenital procedure.  Denies any recent antibiotic use.  She also reports that she believes that she has bacterial vaginosis.  She has a history of recurrent bacterial vaginosis.  She does not tolerate oral metronidazole well and is requesting the gel for treatment.  Denies any specific STI exposures but is open to testing.    History reviewed. No pertinent past medical history.  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   ASCUS of cervix with negative high risk HPV 05/31/2020   Obesity in pregnancy 05/28/2020   Mild tetrahydrocannabinol (THC) abuse 10/15/2016    Past Surgical History:  Procedure Laterality Date   ACROMIO-CLAVICULAR JOINT REPAIR     CESAREAN SECTION N/A 12/17/2020   Procedure: CESAREAN SECTION;  Surgeon: Osborne Oman, MD;  Location: [facility] LD ORS;  Service: Obstetrics;  Laterality: N/A;  Nonreassuring FHR Tracing Arrest of Dilation    OB History     Gravida  5   Para  2   Term  2   Preterm      AB  2   Living  2      SAB      IAB  2   Ectopic      Multiple  0   Live Births  2            Home Medications    Prior to Admission medications   Medication Sig Start Date End Date Taking? Authorizing Provider  metroNIDAZOLE (METROGEL VAGINAL) 0.75 % vaginal gel Place 1  Applicatorful vaginally at bedtime. 03/19/22  Yes Sangeeta Youse K, PA-C  sulfamethoxazole-trimethoprim (BACTRIM DS) 800-160 MG tablet Take 1 tablet by mouth 2 (two) times daily for 5 days. 03/19/22 03/24/22 Yes Neria Procter, Derry Skill, PA-C    Family History Family History  Problem Relation Age of Onset   Diabetes Father    Hypertension Father    Cancer Paternal Uncle    Cancer Paternal Grandmother    Asthma Mother     Social History Social History   Tobacco Use   Smoking status: Former    Packs/day: 0.25    Types: Cigarettes    Quit date: 02/04/2016    Years since quitting: 6.1   Smokeless tobacco: Never  Vaping Use   Vaping Use: Former  Substance Use Topics   Alcohol use: Yes    Comment: socially   Drug use: Yes    Types: Marijuana     Allergies   Patient has no known allergies.   Review of Systems Review of Systems  Constitutional:  Negative for activity change, appetite change, fatigue and fever.  Gastrointestinal:  Positive for abdominal pain. Negative for diarrhea, nausea and vomiting.  Genitourinary:  Positive for frequency, urgency and vaginal discharge. Negative for dysuria, flank pain, pelvic pain,  vaginal bleeding and vaginal pain.  Musculoskeletal:  Negative for arthralgias, back pain and myalgias.     Physical Exam Triage Vital Signs ED Triage Vitals  Enc Vitals Group     BP 03/19/22 1832 123/84     Pulse Rate 03/19/22 1832 88     Resp 03/19/22 1832 18     Temp 03/19/22 1832 98.6 F (37 C)     Temp Source 03/19/22 1832 Oral     SpO2 03/19/22 1832 98 %     Weight --      Height --      Head Circumference --      Peak Flow --      Pain Score 03/19/22 1830 4     Pain Loc --      Pain Edu? --      Excl. in [place]? --    No data found.  Updated Vital Signs BP 123/84 (BP Location: Left Arm)   Pulse 88   Temp 98.6 F (37 C) (Oral)   Resp 18   LMP 03/02/2022 (Exact Date)   SpO2 98%   Visual Acuity Right Eye Distance:   Left Eye Distance:   Bilateral  Distance:    Right Eye Near:   Left Eye Near:    Bilateral Near:     Physical Exam Vitals reviewed.  Constitutional:      General: She is awake. She is not in acute distress.    Appearance: Normal appearance. She is well-developed. She is not ill-appearing.     Comments: Very pleasant female appears stated age in no acute distress sitting comfortably in exam room  HENT:     Head: Normocephalic and atraumatic.  Cardiovascular:     Rate and Rhythm: Normal rate and regular rhythm.     Heart sounds: Normal heart sounds, S1 normal and S2 normal. No murmur heard. Pulmonary:     Effort: Pulmonary effort is normal.     Breath sounds: Normal breath sounds. No wheezing, rhonchi or rales.     Comments: Clear to auscultation bilaterally Abdominal:     General: Bowel sounds are normal.     Palpations: Abdomen is soft.     Tenderness: There is no abdominal tenderness. There is no right CVA tenderness, left CVA tenderness, guarding or rebound.     Comments: Benign abdominal exam  Genitourinary:    Labia:        Right: No rash or tenderness.        Left: No rash or tenderness.      Vagina: Normal.     Cervix: Normal.     Comments: Nontender slightly enlarged Bartholin cyst noted right side of introitus. Psychiatric:        Behavior: Behavior is cooperative.      UC Treatments / Results  Labs (all labs ordered are listed, but only abnormal results are displayed) Labs Reviewed  POCT URINALYSIS DIPSTICK, ED / UC - Abnormal; Notable for the following components:      Result Value   Hgb urine dipstick SMALL (*)    Protein, ur 100 (*)    Leukocytes,Ua SMALL (*)    All other components within normal limits  URINE CULTURE  POC URINE PREG, ED  CERVICOVAGINAL ANCILLARY ONLY    EKG   Radiology No results found.  Procedures Procedures (including critical care time)  Medications Ordered in UC Medications - No data to display  Initial Impression / Assessment and Plan / UC Course   I  have reviewed the triage vital signs and the nursing notes.  Pertinent labs & imaging results that were available during my care of the patient were reviewed by me and considered in my medical decision making (see chart for details).     UA did show hemoglobin and leukocytes.  Given clinical presentation will treat with Bactrim DS.  She was encouraged to rest and drink plenty fluid.  Urine culture was obtained and we discussed potential need to change or stop antibiotics based on culture results.  If she feels any abdominal pain, pelvic pain, fever, nausea, vomiting she is to be seen immediately.  Discussed that small lesion she has noticed is likely slightly enlarged Bartholin cyst.  No evidence of infection.  Did recommend she follow-up with an OB/GYN.  STI swab was collected today-results pending.  We will treat for bacterial vaginosis with MetroGel.  Discussed that she should not drink any alcohol with this medication due to Antabuse side effects.  She is to wear loosefitting cotton underwear and use hypoallergenic soaps and detergents.  If anything worsens she is to be seen for reevaluation.  Recommended close follow-up with OB/GYN.  Final Clinical Impressions(s) / UC Diagnoses   Final diagnoses:  Urinary frequency  Abnormal urinalysis  Malodorous urine  Vaginal odor  Vaginal discharge  Bartholin cyst     Discharge Instructions      Recommend treat for urinary tract infection.  Take Bactrim DS twice daily for 5 days.  If you develop any oral lesions or rash stop the medication to be seen.  If we need to change your antibiotics or treatment plan based on your culture results we will contact you.  Make sure you rest and drink plenty of fluid.  I have also called in metronidazole for bacterial vaginosis.  We will contact you if we need to arrange additional treatment based on your swab results.  Wear loosefitting cotton underwear and use hypoallergenic soaps and detergents.  Follow-up  with OB/GYN; call to schedule appointment.  If you have any worsening symptoms including abdominal pain, pelvic pain, fever, nausea, vomiting you need to be seen immediately.     ED Prescriptions     Medication Sig Dispense Auth. Provider   sulfamethoxazole-trimethoprim (BACTRIM DS) 800-160 MG tablet Take 1 tablet by mouth 2 (two) times daily for 5 days. 10 tablet Demarqus Jocson K, PA-C   metroNIDAZOLE (METROGEL VAGINAL) 0.75 % vaginal gel Place 1 Applicatorful vaginally at bedtime. 70 g Nary Sneed, Derry Skill, PA-C      PDMP not reviewed this encounter.   Terrilee Croak, PA-C 03/19/22 1915

## 2022-03-19 NOTE — Discharge Instructions (Signed)
Recommend treat for urinary tract infection.  Take Bactrim DS twice daily for 5 days.  If you develop any oral lesions or rash stop the medication to be seen.  If we need to change your antibiotics or treatment plan based on your culture results we will contact you.  Make sure you rest and drink plenty of fluid.  I have also called in metronidazole for bacterial vaginosis.  We will contact you if we need to arrange additional treatment based on your swab results.  Wear loosefitting cotton underwear and use hypoallergenic soaps and detergents.  Follow-up with OB/GYN; call to schedule appointment.  If you have any worsening symptoms including abdominal pain, pelvic pain, fever, nausea, vomiting you need to be seen immediately.

## 2022-03-19 NOTE — ED Triage Notes (Signed)
Patient states that since last week her urine has a foul odor, she is also worried about BV. She is having right sided abdominal pain.

## 2022-03-20 ENCOUNTER — Telehealth (HOSPITAL_COMMUNITY): Payer: Self-pay | Admitting: Emergency Medicine

## 2022-03-20 LAB — CERVICOVAGINAL ANCILLARY ONLY
Bacterial Vaginitis (gardnerella): POSITIVE — AB
Candida Glabrata: NEGATIVE
Candida Vaginitis: NEGATIVE
Chlamydia: NEGATIVE
Comment: NEGATIVE
Comment: NEGATIVE
Comment: NEGATIVE
Comment: NEGATIVE
Comment: NEGATIVE
Comment: NORMAL
Neisseria Gonorrhea: POSITIVE — AB
Trichomonas: NEGATIVE

## 2022-03-20 NOTE — Telephone Encounter (Signed)
Per protocol, patient will need treatment with IM ROcephin '500mg'$  for positive Gonorrhea.   Was treated at visit with Metrogel Attempted to reach patient x 1, VM is not setup [facility] notified

## 2022-03-21 ENCOUNTER — Ambulatory Visit (HOSPITAL_COMMUNITY)
Admission: EM | Admit: 2022-03-21 | Discharge: 2022-03-21 | Disposition: A | Discharge status: Home / Self Care | Payer: Medicaid Other | Attending: Internal Medicine | Admitting: Internal Medicine

## 2022-03-21 ENCOUNTER — Encounter (HOSPITAL_COMMUNITY): Payer: Self-pay | Admitting: Emergency Medicine

## 2022-03-21 DIAGNOSIS — A549 Gonococcal infection, unspecified: Secondary | ICD-10-CM

## 2022-03-21 MED ORDER — LIDOCAINE HCL (PF) 1 % IJ SOLN
INTRAMUSCULAR | Status: AC
  Filled 2022-03-21: qty 2

## 2022-03-21 MED ORDER — CEFTRIAXONE SODIUM 500 MG IJ SOLR
INTRAMUSCULAR | Status: AC
  Filled 2022-03-21: qty 500

## 2022-03-21 MED ORDER — LIDOCAINE HCL (PF) 1 % IJ SOLN
INTRAMUSCULAR | Status: AC
Start: 2022-03-21 — End: ?
  Filled 2022-03-21: qty 2

## 2022-03-21 MED ORDER — CEFTRIAXONE SODIUM 500 MG IJ SOLR
500.0000 mg | Freq: Once | INTRAMUSCULAR | Status: AC
  Administered 2022-03-21: 500 mg via INTRAMUSCULAR

## 2022-03-21 NOTE — ED Triage Notes (Signed)
Here for treatment for recent positive STD test. Will verify with provider and likely give Rocephin IM.

## 2022-03-22 LAB — URINE CULTURE: Culture: >=100000 — AB

## 2022-03-27 ENCOUNTER — Ambulatory Visit (HOSPITAL_COMMUNITY)
Admission: EM | Admit: 2022-03-27 | Discharge: 2022-03-27 | Disposition: A | Discharge status: Home / Self Care | Payer: Medicaid Other | Attending: Urgent Care | Admitting: Urgent Care

## 2022-03-27 ENCOUNTER — Encounter (HOSPITAL_COMMUNITY): Payer: Self-pay

## 2022-03-27 ENCOUNTER — Ambulatory Visit (INDEPENDENT_AMBULATORY_CARE_PROVIDER_SITE_OTHER): Payer: Medicaid Other

## 2022-03-27 DIAGNOSIS — N760 Acute vaginitis: Secondary | ICD-10-CM

## 2022-03-27 DIAGNOSIS — M25512 Pain in left shoulder: Secondary | ICD-10-CM

## 2022-03-27 DIAGNOSIS — S46219A Strain of muscle, fascia and tendon of other parts of biceps, unspecified arm, initial encounter: Secondary | ICD-10-CM | POA: Diagnosis not present

## 2022-03-27 MED ORDER — FLUCONAZOLE 150 MG PO TABS: 150.0000 mg | ORAL_TABLET | Freq: Once | ORAL | 0 refills | Status: AC

## 2022-03-27 MED ORDER — NAPROXEN 375 MG PO TABS: 375.0000 mg | ORAL_TABLET | Freq: Two times a day (BID) | ORAL | 0 refills | Status: DC

## 2022-03-27 NOTE — Discharge Instructions (Addendum)
Given your recent antibiotic use, will start a single tab of diflucan. Return for a re-swab in 4-5 weeks if symptoms persist.  Your xray does not show any fractures. Note is made of an Os Acromiale. This is unlikely to be the cause of your pain, however I would like you to follow up with ortho in 1-2 weeks.  Given location, I suspect a bicep strain. Please see the attached handout on rehab exercises. Ice your shoulder three times daily for the next 3 days. Take the NSAID called in today twice daily with food, do not take any additional OTC NSAIDS (motrin, aleve, ibuprofen). Follow up if your symptoms persist >7-10 days

## 2022-03-27 NOTE — ED Provider Notes (Signed)
[place]    CSN: [id number] Arrival date & time: 03/27/22  1651      History   Chief Complaint Chief Complaint  Patient presents with   Shoulder Injury    left    HPI Angela Cortez is a 31 y.o. female.   Pleasant 31 year old female presents today primarily due to concern of left shoulder pain.  She states she was roughhousing 2 days ago.  She denied any pain initially, but states she woke up the next morning with left shoulder pain.  She points to her anterior shoulder extending down into her left bicep.  She states any range of motion seems to make it worse.  It is extremely painful and weak when trying to lift above her head.  She has not tried any over-the-counter treatments. Additionally, patient is complaining of a white clumpy discharge.  No itching.  She was just recently treated for gonorrhea and bacterial vaginosis.  She is concerned that her discharge now is "different than it was previously".   Shoulder Injury    History reviewed. No pertinent past medical history.  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   ASCUS of cervix with negative high risk HPV 05/31/2020   Obesity in pregnancy 05/28/2020   Mild tetrahydrocannabinol (THC) abuse 10/15/2016    Past Surgical History:  Procedure Laterality Date   ACROMIO-CLAVICULAR JOINT REPAIR     CESAREAN SECTION N/A 12/17/2020   Procedure: CESAREAN SECTION;  Surgeon: Osborne Oman, MD;  Location: [facility];  Service: Obstetrics;  Laterality: N/A;  Nonreassuring FHR Tracing Arrest of Dilation    OB History     Gravida  5   Para  2   Term  2   Preterm      AB  2   Living  2      SAB      IAB  2   Ectopic      Multiple  0   Live Births  2            Home Medications    Prior to Admission medications   Medication Sig Start Date End Date Taking? Authorizing Provider  fluconazole (DIFLUCAN) 150 MG tablet Take 1 tablet (150 mg total) by  mouth once for 1 dose. 03/27/22 03/27/22 Yes Emmaline Wahba L, PA  naproxen (NAPROSYN) 375 MG tablet Take 1 tablet (375 mg total) by mouth 2 (two) times daily with a meal. As needed 03/27/22  Yes Channie Bostick L, PA    Family History Family History  Problem Relation Age of Onset   Diabetes Father    Hypertension Father    Cancer Paternal Uncle    Cancer Paternal Grandmother    Asthma Mother     Social History Social History   Tobacco Use   Smoking status: Former    Packs/day: 0.25    Types: Cigarettes    Quit date: 02/04/2016    Years since quitting: 6.1   Smokeless tobacco: Never  Vaping Use   Vaping Use: Former  Substance Use Topics   Alcohol use: Yes    Comment: socially   Drug use: Yes    Types: Marijuana     Allergies   Patient has no known allergies.   Review of Systems Review of Systems  Genitourinary:  Positive for vaginal discharge.  Musculoskeletal:  Positive for arthralgias.  All other systems reviewed and are negative.    Physical Exam Triage  Vital Signs ED Triage Vitals  Enc Vitals Group     BP 03/27/22 1800 123/85     Pulse Rate 03/27/22 1800 88     Resp 03/27/22 1800 17     Temp 03/27/22 1800 98.1 F (36.7 C)     Temp Source 03/27/22 1800 Oral     SpO2 03/27/22 1800 100 %     Weight --      Height --      Head Circumference --      Peak Flow --      Pain Score 03/27/22 1806 10     Pain Loc --      Pain Edu? --      Excl. in [place]? --    No data found.  Updated Vital Signs BP 123/85 (BP Location: Left Arm)   Pulse 88   Temp 98.1 F (36.7 C) (Oral)   Resp 17   LMP 03/02/2022 (Exact Date)   SpO2 100%   Visual Acuity Right Eye Distance:   Left Eye Distance:   Bilateral Distance:    Right Eye Near:   Left Eye Near:    Bilateral Near:     Physical Exam Vitals and nursing note reviewed. Exam conducted with a chaperone present.  Constitutional:      General: She is not in acute distress.    Appearance: Normal appearance. She  is normal weight. She is not ill-appearing, toxic-appearing or diaphoretic.  HENT:     Head: Normocephalic and atraumatic.  Genitourinary:    Comments: deferred Musculoskeletal:        General: Tenderness (reproducible tenderness to palpation of L proximal bicep) present. No swelling, deformity or signs of injury.     Right lower leg: No edema.     Left lower leg: No edema.     Comments: Decreased ROM due to pain Negative hawkin and neer Negative O-briens Negative sulcus  Skin:    General: Skin is warm and dry.     Capillary Refill: Capillary refill takes less than 2 seconds.     Coloration: Skin is not jaundiced.     Findings: No bruising, erythema or rash.  Neurological:     General: No focal deficit present.     Mental Status: She is alert and oriented to person, place, and time.     Sensory: No sensory deficit.     Motor: Weakness (due to pain or L shoulder) present.     Coordination: Coordination normal.     Gait: Gait normal.     Deep Tendon Reflexes: Reflexes normal.      UC Treatments / Results  Labs (all labs ordered are listed, but only abnormal results are displayed) Labs Reviewed  CERVICOVAGINAL ANCILLARY ONLY    EKG   Radiology DG Shoulder Left  Result Date: 03/27/2022 CLINICAL DATA:  Fall 2 days ago with left shoulder pain, initial encounter EXAM: LEFT SHOULDER - 2+ VIEW COMPARISON:  None Available. FINDINGS: Small os acromiale is noted. No acute fracture or dislocation is seen. Underlying bony thorax and soft tissues are within normal limits. IMPRESSION: No acute abnormality Electronically Signed   By: Inez Catalina M.D.   On: 03/27/2022 19:19    Procedures Procedures (including critical care time)  Medications Ordered in UC Medications - No data to display  Initial Impression / Assessment and Plan / UC Course  I have reviewed the triage vital signs and the nursing notes.  Pertinent labs & imaging results that were available during  my care of the  patient were reviewed by me and considered in my medical decision making (see chart for details).     Vaginitis -originally Aptima swab was ordered, however due to the nature of the swab and a recent positive gonorrhea, repeating so soon not recommended.  Given description, we will start with Diflucan suspecting this is an antibiotic induced vaginitis. Biceps strain - OS acromiale likely incidental, however will refer to ortho/sports med for follow up.  NSAIDs scheduled twice daily with food, ice, exercises as discussed.  Call sports med in the morning to schedule follow-up.  Final Clinical Impressions(s) / UC Diagnoses   Final diagnoses:  Vaginitis and vulvovaginitis  Biceps strain, initial encounter     Discharge Instructions      Given your recent antibiotic use, will start a single tab of diflucan. Return for a re-swab in 4-5 weeks if symptoms persist.  Your xray does not show any fractures. Note is made of an Os Acromiale. This is unlikely to be the cause of your pain, however I would like you to follow up with ortho in 1-2 weeks.  Given location, I suspect a bicep strain. Please see the attached handout on rehab exercises. Ice your shoulder three times daily for the next 3 days. Take the NSAID called in today twice daily with food, do not take any additional OTC NSAIDS (motrin, aleve, ibuprofen). Follow up if your symptoms persist >7-10 days    ED Prescriptions     Medication Sig Dispense Auth. Provider   fluconazole (DIFLUCAN) 150 MG tablet Take 1 tablet (150 mg total) by mouth once for 1 dose. 1 tablet Ilyssa Grennan L, PA   naproxen (NAPROSYN) 375 MG tablet Take 1 tablet (375 mg total) by mouth 2 (two) times daily with a meal. As needed 20 tablet Mylin Gignac L, PA      PDMP not reviewed this encounter.   Chaney Malling, [place] 03/27/22 2031

## 2022-03-27 NOTE — ED Triage Notes (Signed)
Pt reports possible left shoulder dislocation, states she cant lift her shoulder. Pt shared she got tackled the other day and started having pain that night. No pain medication taken.  Pt also reports she finished her medication for UTI and STD, after finishing she states she has a different white discharge, nauseated and having hot flashes.

## 2022-04-02 DIAGNOSIS — H5213 Myopia, bilateral: Secondary | ICD-10-CM | POA: Diagnosis not present

## 2022-04-08 NOTE — ED Provider Notes (Signed)
[facility]    CSN: [id number] Arrival date & time: 02/06/22  1722      History   Chief Complaint Chief Complaint  Patient presents with   SEXUALLY TRANSMITTED DISEASE    HPI Angela Cortez is a 31 y.o. female.   Pt complains of vaginal odor that started about three days ago.  Denies vaginal discharge, rash, vaginal bleeding, abdominal pain, fever, chills.  She is requesting STD testing today.      History reviewed. No pertinent past medical history.  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   ASCUS of cervix with negative high risk HPV 05/31/2020   Obesity in pregnancy 05/28/2020   Mild tetrahydrocannabinol (THC) abuse 10/15/2016    Past Surgical History:  Procedure Laterality Date   ACROMIO-CLAVICULAR JOINT REPAIR     CESAREAN SECTION N/A 12/17/2020   Procedure: CESAREAN SECTION;  Surgeon: Osborne Oman, MD;  Location: [facility];  Service: Obstetrics;  Laterality: N/A;  Nonreassuring FHR Tracing Arrest of Dilation    OB History     Gravida  5   Para  2   Term  2   Preterm      AB  2   Living  2      SAB      IAB  2   Ectopic      Multiple  0   Live Births  2            Home Medications    Prior to Admission medications   Medication Sig Start Date End Date Taking? Authorizing Provider  naproxen (NAPROSYN) 375 MG tablet Take 1 tablet (375 mg total) by mouth 2 (two) times daily with a meal. As needed 03/27/22   Chaney Malling, PA    Family History Family History  Problem Relation Age of Onset   Diabetes Father    Hypertension Father    Cancer Paternal Uncle    Cancer Paternal Grandmother    Asthma Mother     Social History Social History   Tobacco Use   Smoking status: Former    Packs/day: 0.25    Types: Cigarettes    Quit date: 02/04/2016    Years since quitting: 6.1   Smokeless tobacco: Never  Vaping Use   Vaping Use: Former  Substance Use Topics   Alcohol use: Yes     Comment: socially   Drug use: Yes    Types: Marijuana     Allergies   Patient has no known allergies.   Review of Systems Review of Systems  Constitutional:  Negative for chills and fever.  HENT:  Negative for ear pain and sore throat.   Eyes:  Negative for pain and visual disturbance.  Respiratory:  Negative for cough and shortness of breath.   Cardiovascular:  Negative for chest pain and palpitations.  Gastrointestinal:  Negative for abdominal pain and vomiting.  Genitourinary:  Negative for dysuria and hematuria.       Vaginal odor  Musculoskeletal:  Negative for arthralgias and back pain.  Skin:  Negative for color change and rash.  Neurological:  Negative for seizures and syncope.  All other systems reviewed and are negative.    Physical Exam Triage Vital Signs ED Triage Vitals [02/06/22 1824]  Enc Vitals Group     BP 122/80     Pulse Rate 94     Resp 18     Temp 99.4 F (37.4 C)  Temp Source Oral     SpO2 97 %     Weight      Height      Head Circumference      Peak Flow      Pain Score 0     Pain Loc      Pain Edu?      Excl. in [place]?    No data found.  Updated Vital Signs BP 122/80 (BP Location: Left Arm)   Pulse 94   Temp 99.4 F (37.4 C) (Oral)   Resp 18   LMP 01/30/2022   SpO2 97%   Breastfeeding No   Visual Acuity Right Eye Distance:   Left Eye Distance:   Bilateral Distance:    Right Eye Near:   Left Eye Near:    Bilateral Near:     Physical Exam Vitals and nursing note reviewed.  Constitutional:      General: She is not in acute distress.    Appearance: She is well-developed.  HENT:     Head: Normocephalic and atraumatic.  Eyes:     Conjunctiva/sclera: Conjunctivae normal.  Cardiovascular:     Rate and Rhythm: Normal rate and regular rhythm.     Heart sounds: No murmur heard. Pulmonary:     Effort: Pulmonary effort is normal. No respiratory distress.     Breath sounds: Normal breath sounds.  Abdominal:      Palpations: Abdomen is soft.     Tenderness: There is no abdominal tenderness.  Musculoskeletal:        General: No swelling.     Cervical back: Neck supple.  Skin:    General: Skin is warm and dry.     Capillary Refill: Capillary refill takes less than 2 seconds.  Neurological:     Mental Status: She is alert.  Psychiatric:        Mood and Affect: Mood normal.      UC Treatments / Results  Labs (all labs ordered are listed, but only abnormal results are displayed) Labs Reviewed  CERVICOVAGINAL ANCILLARY ONLY - Abnormal; Notable for the following components:      Result Value   Bacterial Vaginitis (gardnerella) Positive (*)    All other components within normal limits    EKG   Radiology No results found.  Procedures Procedures (including critical care time)  Medications Ordered in UC Medications - No data to display  Initial Impression / Assessment and Plan / UC Course  I have reviewed the triage vital signs and the nursing notes.  Pertinent labs & imaging results that were available during my care of the patient were reviewed by me and considered in my medical decision making (see chart for details).     Will treat for BV.  Cervicovaginal self swab in clinic today, results pending.  Will change treatment plan if indicated based on results.  Advised abstaining from sexual intercourse until treatment completed.  Final Clinical Impressions(s) / UC Diagnoses   Final diagnoses:  Bacterial vaginitis     Discharge Instructions      Use metrogel as prescribed Will call with lab results and treat if indicated    ED Prescriptions     Medication Sig Dispense Auth. Provider   metroNIDAZOLE (METROGEL VAGINAL) 0.75 % vaginal gel Place 1 Applicatorful vaginally daily for 5 days. 50 g Ward, Lenise Arena, PA-C      PDMP not reviewed this encounter.   Ward, Lenise Arena, PA-C 04/08/22 1727

## 2022-04-12 DIAGNOSIS — Z419 Encounter for procedure for purposes other than remedying health state, unspecified: Secondary | ICD-10-CM | POA: Diagnosis not present

## 2022-05-12 ENCOUNTER — Encounter (HOSPITAL_COMMUNITY): Payer: Self-pay

## 2022-05-12 ENCOUNTER — Ambulatory Visit (HOSPITAL_COMMUNITY)
Admission: EM | Admit: 2022-05-12 | Discharge: 2022-05-12 | Disposition: A | Discharge status: Home / Self Care | Payer: Medicaid Other | Attending: Urgent Care | Admitting: Urgent Care

## 2022-05-12 DIAGNOSIS — N761 Subacute and chronic vaginitis: Secondary | ICD-10-CM

## 2022-05-12 DIAGNOSIS — Z419 Encounter for procedure for purposes other than remedying health state, unspecified: Secondary | ICD-10-CM | POA: Diagnosis not present

## 2022-05-12 LAB — POC URINE PREG, ED
Preg Test, Ur: NEGATIVE
Preg Test, Ur: NEGATIVE

## 2022-05-12 LAB — POCT URINALYSIS DIPSTICK, ED / UC
Bilirubin Urine: NEGATIVE
Glucose, UA: NEGATIVE mg/dL
Hgb urine dipstick: NEGATIVE
Leukocytes,Ua: NEGATIVE
Nitrite: NEGATIVE
Protein, ur: NEGATIVE mg/dL
Specific Gravity, Urine: 1.02 (ref 1.005–1.030)
Urobilinogen, UA: 0.2 mg/dL (ref 0.0–1.0)
pH: 7 (ref 5.0–8.0)

## 2022-05-12 MED ORDER — CLINDAMYCIN HCL 300 MG PO CAPS: 300.0000 mg | ORAL_CAPSULE | Freq: Two times a day (BID) | ORAL | 0 refills | Status: AC

## 2022-05-12 NOTE — Discharge Instructions (Addendum)
Your urine sample is normal.  We will call with the results of the Aptima swab.   Please do not resume intercourse until your results have been received. Due to your recurrence of bacterial vaginosis, lets try clindamycin instead.  We will do this twice daily for 7 days. Please take this medication with food.  It is also recommended you eat yogurt and take an over-the-counter probiotic to prevent GI side effects. Please do not re-use vaginal applicators.

## 2022-05-12 NOTE — ED Triage Notes (Signed)
Abdominal cramps x last Thursday. Period ended and Patient was treated for STD, UTI and BV on 03/27/22.    Patient having BV, UTI symptoms and abdominal cramping at the end of taking medications.

## 2022-05-12 NOTE — ED Provider Notes (Signed)
[facility]    CSN: [id number] Arrival date & time: 05/12/22  1126      History   Chief Complaint Chief Complaint  Patient presents with   Urinary Tract Infection   Vaginal Discharge   Abdominal Pain    HPI Angela Cortez is a 31 y.o. female.   31 year old female presents today due to concerns of recurrent vaginal discharge and pelvic cramping.  She had gonorrhea earlier in August, states symptoms feel similar.  She denies any known exposures.  She reports also a history of recurrent BV.  She does not like taking the pills that therefore uses the vaginal suppositories.  She states that she did not use the sticks appropriately and thus has been cleaning off the suppository and reusing it.  She denies douching, tries products to try and keep it clean.  Just got off menstrual period.  Denies dysuria, hematuria, flank pain, fever but does admit to some frequency.   Urinary Tract Infection Associated symptoms: abdominal pain and vaginal discharge   Vaginal Discharge Associated symptoms: abdominal pain   Abdominal Pain Associated symptoms: vaginal discharge   Shoulder Injury Associated symptoms include abdominal pain.    History reviewed. No pertinent past medical history.  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   ASCUS of cervix with negative high risk HPV 05/31/2020   Obesity in pregnancy 05/28/2020   Mild tetrahydrocannabinol (THC) abuse 10/15/2016    Past Surgical History:  Procedure Laterality Date   ACROMIO-CLAVICULAR JOINT REPAIR     CESAREAN SECTION N/A 12/17/2020   Procedure: CESAREAN SECTION;  Surgeon: Osborne Oman, MD;  Location: [facility];  Service: Obstetrics;  Laterality: N/A;  Nonreassuring FHR Tracing Arrest of Dilation    OB History     Gravida  5   Para  2   Term  2   Preterm      AB  2   Living  2      SAB      IAB  2   Ectopic      Multiple  0   Live Births  2             Home Medications    Prior to Admission medications   Medication Sig Start Date End Date Taking? Authorizing Provider  clindamycin (CLEOCIN) 300 MG capsule Take 1 capsule (300 mg total) by mouth in the morning and at bedtime for 7 days. 05/12/22 05/19/22 Yes Rafi Kenneth L, PA  naproxen (NAPROSYN) 375 MG tablet Take 1 tablet (375 mg total) by mouth 2 (two) times daily with a meal. As needed 03/27/22   Chaney Malling, PA    Family History Family History  Problem Relation Age of Onset   Diabetes Father    Hypertension Father    Cancer Paternal Uncle    Cancer Paternal Grandmother    Asthma Mother     Social History Social History   Tobacco Use   Smoking status: Former    Packs/day: 0.25    Types: Cigarettes    Quit date: 02/04/2016    Years since quitting: 6.2   Smokeless tobacco: Never  Vaping Use   Vaping Use: Former  Substance Use Topics   Alcohol use: Yes    Comment: socially   Drug use: Yes    Types: Marijuana     Allergies   Patient has no known allergies.   Review of Systems Review of Systems  Gastrointestinal:  Positive for abdominal pain.  Genitourinary:  Positive for vaginal discharge.  Musculoskeletal:  Negative for arthralgias.  All other systems reviewed and are negative.    Physical Exam Triage Vital Signs ED Triage Vitals  Enc Vitals Group     BP 03/27/22 1800 123/85     Pulse Rate 03/27/22 1800 88     Resp 03/27/22 1800 17     Temp 03/27/22 1800 98.1 F (36.7 C)     Temp Source 03/27/22 1800 Oral     SpO2 03/27/22 1800 100 %     Weight --      Height --      Head Circumference --      Peak Flow --      Pain Score 03/27/22 1806 10     Pain Loc --      Pain Edu? --      Excl. in [place]? --    No data found.  Updated Vital Signs BP (!) 123/98 (BP Location: Left Arm)   Pulse 65   Temp 98.2 F (36.8 C) (Oral)   Resp 16   LMP 05/01/2022 (Exact Date)   SpO2 100%   Visual Acuity Right Eye Distance:   Left Eye Distance:    Bilateral Distance:    Right Eye Near:   Left Eye Near:    Bilateral Near:     Physical Exam Vitals and nursing note reviewed.  Constitutional:      General: She is not in acute distress.    Appearance: She is well-developed and normal weight. She is not ill-appearing or toxic-appearing.  HENT:     Head: Normocephalic and atraumatic.  Eyes:     Conjunctiva/sclera: Conjunctivae normal.  Cardiovascular:     Rate and Rhythm: Normal rate and regular rhythm.     Heart sounds: No murmur heard. Pulmonary:     Effort: Pulmonary effort is normal. No respiratory distress.     Breath sounds: Normal breath sounds.  Abdominal:     General: Abdomen is flat. Bowel sounds are normal. There is no distension.     Palpations: Abdomen is soft.     Tenderness: There is no abdominal tenderness.  Genitourinary:    Comments: deferred Musculoskeletal:        General: No swelling.     Cervical back: Neck supple.  Skin:    General: Skin is warm and dry.     Capillary Refill: Capillary refill takes less than 2 seconds.  Neurological:     Mental Status: She is alert.  Psychiatric:        Mood and Affect: Mood normal.      UC Treatments / Results  Labs (all labs ordered are listed, but only abnormal results are displayed) Labs Reviewed  POCT URINALYSIS DIPSTICK, ED / UC - Abnormal; Notable for the following components:      Result Value   Ketones, ur TRACE (*)    All other components within normal limits  POC URINE PREG, ED  POC URINE PREG, ED  CERVICOVAGINAL ANCILLARY ONLY    EKG   Radiology No results found.  Procedures Procedures (including critical care time)  Medications Ordered in UC Medications - No data to display  Initial Impression / Assessment and Plan / UC Course  I have reviewed the triage vital signs and the nursing notes.  Pertinent labs & imaging results that were available during my care of the patient were reviewed by me and considered in my medical decision  making (see chart for details).     Vaginitis -this appears to be a recurrent issue.  Patient has had STIs in the past, adequately treated.  We will obtain Aptima swab today.  It appears that she also has recurrent bacterial vaginosis, has been only using the vaginal suppositories and sounds like it has been used inappropriately.  She does not want p.o. Flagyl, and Tindamax is not covered through her insurance.  She is requesting to try p.o. clindamycin, understanding this is not first-line therapy.  Recommended follow-up with a gynecologist due to the recurrence of her vaginal irritation.  UA not indicative of UTI at present time.  Final Clinical Impressions(s) / UC Diagnoses   Final diagnoses:  Subacute vaginitis     Discharge Instructions      Your urine sample is normal.  We will call with the results of the Aptima swab.   Please do not resume intercourse until your results have been received. Due to your recurrence of bacterial vaginosis, lets try clindamycin instead.  We will do this twice daily for 7 days. Please take this medication with food.  It is also recommended you eat yogurt and take an over-the-counter probiotic to prevent GI side effects. Please do not re-use vaginal applicators.   ED Prescriptions     Medication Sig Dispense Auth. Provider   clindamycin (CLEOCIN) 300 MG capsule Take 1 capsule (300 mg total) by mouth in the morning and at bedtime for 7 days. 14 capsule Adian Jablonowski L, PA      PDMP not reviewed this encounter.   Chaney Malling, [place] 03/27/22 2031    Chaney Malling, [place] 05/12/22 2047

## 2022-05-13 LAB — CERVICOVAGINAL ANCILLARY ONLY
Bacterial Vaginitis (gardnerella): POSITIVE — AB
Candida Glabrata: NEGATIVE
Candida Vaginitis: NEGATIVE
Chlamydia: NEGATIVE
Comment: NEGATIVE
Comment: NEGATIVE
Comment: NEGATIVE
Comment: NEGATIVE
Comment: NEGATIVE
Comment: NORMAL
Neisseria Gonorrhea: NEGATIVE
Trichomonas: NEGATIVE

## 2022-06-12 DIAGNOSIS — Z419 Encounter for procedure for purposes other than remedying health state, unspecified: Secondary | ICD-10-CM | POA: Diagnosis not present

## 2022-07-12 DIAGNOSIS — Z419 Encounter for procedure for purposes other than remedying health state, unspecified: Secondary | ICD-10-CM | POA: Diagnosis not present

## 2022-07-19 ENCOUNTER — Ambulatory Visit (HOSPITAL_COMMUNITY)
Admission: EM | Admit: 2022-07-19 | Discharge: 2022-07-19 | Disposition: A | Discharge status: Home / Self Care | Payer: Medicaid Other | Attending: Internal Medicine | Admitting: Internal Medicine

## 2022-07-19 ENCOUNTER — Encounter (HOSPITAL_COMMUNITY): Payer: Self-pay

## 2022-07-19 DIAGNOSIS — Z113 Encounter for screening for infections with a predominantly sexual mode of transmission: Secondary | ICD-10-CM | POA: Diagnosis not present

## 2022-07-19 DIAGNOSIS — N390 Urinary tract infection, site not specified: Secondary | ICD-10-CM | POA: Diagnosis not present

## 2022-07-19 LAB — POCT URINALYSIS DIPSTICK, ED / UC
Bilirubin Urine: NEGATIVE
Glucose, UA: NEGATIVE mg/dL
Ketones, ur: NEGATIVE mg/dL
Nitrite: POSITIVE — AB
Protein, ur: NEGATIVE mg/dL
Specific Gravity, Urine: >=1.03 (ref 1.005–1.030)
Urobilinogen, UA: 0.2 mg/dL (ref 0.0–1.0)
pH: 5.5 (ref 5.0–8.0)

## 2022-07-19 MED ORDER — CEPHALEXIN 500 MG PO CAPS: 500.0000 mg | ORAL_CAPSULE | Freq: Two times a day (BID) | ORAL | 0 refills | Status: AC

## 2022-07-19 NOTE — ED Provider Notes (Signed)
[place]    CSN: [id number] Arrival date & time: 07/19/22  1752      History   Chief Complaint Chief Complaint  Patient presents with   Urinary Tract Infection    HPI Angela Cortez is a 31 y.o. female presents urgent care today with complaints of low back pain and foul-smelling urine.  Patient reports symptoms ongoing x 1 week with associated urinary frequency.  She denies any recent fever, chills, abdominal or pelvic pain.  Denies any abnormal vaginal discharge or bleeding.  Also requesting STI testing.    History reviewed. No pertinent past medical history.  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   ASCUS of cervix with negative high risk HPV 05/31/2020   Obesity in pregnancy 05/28/2020   Mild tetrahydrocannabinol (THC) abuse 10/15/2016    Past Surgical History:  Procedure Laterality Date   ACROMIO-CLAVICULAR JOINT REPAIR     CESAREAN SECTION N/A 12/17/2020   Procedure: CESAREAN SECTION;  Surgeon: Osborne Oman, MD;  Location: [facility];  Service: Obstetrics;  Laterality: N/A;  Nonreassuring FHR Tracing Arrest of Dilation    OB History     Gravida  5   Para  2   Term  2   Preterm      AB  2   Living  2      SAB      IAB  2   Ectopic      Multiple  0   Live Births  2            Home Medications    Prior to Admission medications   Medication Sig Start Date End Date Taking? Authorizing Provider  cephALEXin (KEFLEX) 500 MG capsule Take 1 capsule (500 mg total) by mouth 2 (two) times daily for 7 days. 07/19/22 07/26/22 Yes Rudolpho Sevin, NP  naproxen (NAPROSYN) 375 MG tablet Take 1 tablet (375 mg total) by mouth 2 (two) times daily with a meal. As needed 03/27/22   Chaney Malling, PA    Family History Family History  Problem Relation Age of Onset   Diabetes Father    Hypertension Father    Cancer Paternal Uncle    Cancer Paternal Grandmother    Asthma Mother     Social  History Social History   Tobacco Use   Smoking status: Former    Packs/day: 0.25    Types: Cigarettes    Quit date: 02/04/2016    Years since quitting: 6.4   Smokeless tobacco: Never  Vaping Use   Vaping Use: Former  Substance Use Topics   Alcohol use: Yes    Comment: socially   Drug use: Yes    Types: Marijuana     Allergies   Patient has no known allergies.   Review of Systems Review of Systems   Physical Exam Triage Vital Signs ED Triage Vitals [07/19/22 1921]  Enc Vitals Group     BP 121/69     Pulse Rate 85     Resp 16     Temp 97.8 F (36.6 C)     Temp Source Oral     SpO2 98 %     Weight      Height      Head Circumference      Peak Flow      Pain Score      Pain Loc      Pain Edu?  Excl. in [place]?    No data found.  Updated Vital Signs BP 121/69 (BP Location: Left Arm)   Pulse 85   Temp 97.8 F (36.6 C) (Oral)   Resp 16   LMP 06/26/2022   SpO2 98%   Visual Acuity Right Eye Distance:   Left Eye Distance:   Bilateral Distance:    Right Eye Near:   Left Eye Near:    Bilateral Near:     Physical Exam Constitutional:      General: She is not in acute distress.    Appearance: Normal appearance. She is not ill-appearing or toxic-appearing.  HENT:     Mouth/Throat:     Mouth: Mucous membranes are moist.  Cardiovascular:     Rate and Rhythm: Normal rate and regular rhythm.  Pulmonary:     Effort: Pulmonary effort is normal.     Breath sounds: Normal breath sounds.  Abdominal:     General: Bowel sounds are normal.     Palpations: Abdomen is soft.     Tenderness: There is no abdominal tenderness. There is right CVA tenderness and left CVA tenderness. There is no guarding or rebound.  Skin:    General: Skin is warm and dry.  Neurological:     General: No focal deficit present.     Mental Status: She is alert and oriented to person, place, and time.  Psychiatric:        Mood and Affect: Mood normal.        Behavior: Behavior  normal.      UC Treatments / Results  Labs (all labs ordered are listed, but only abnormal results are displayed) Labs Reviewed  POCT URINALYSIS DIPSTICK, ED / UC - Abnormal; Notable for the following components:      Result Value   Hgb urine dipstick TRACE (*)    Nitrite POSITIVE (*)    Leukocytes,Ua TRACE (*)    All other components within normal limits  URINE CULTURE  CERVICOVAGINAL ANCILLARY ONLY    EKG   Radiology No results found.  Procedures Procedures (including critical care time)  Medications Ordered in UC Medications - No data to display  Initial Impression / Assessment and Plan / UC Course  I have reviewed the triage vital signs and the nursing notes.  Pertinent labs & imaging results that were available during my care of the patient were reviewed by me and considered in my medical decision making (see chart for details).  UTI -Urinalysis positive for nitrites and hemoglobin consistent with UTI.  No evidence of systemic illness, patient afebrile, VSS -Keflex twice daily x 7 days.  Will send urine for culture  -Strict follow-up precautions discussed -STI screening at patient request.  No treatment given today.  Treat accordingly based on results.  Reviewed expections re: course of current medical issues. Questions answered. Outlined signs and symptoms indicating need for more acute intervention. Pt verbalized understanding. AVS given  Final Clinical Impressions(s) / UC Diagnoses   Final diagnoses:  Urinary tract infection without hematuria, site unspecified  Routine screening for STI (sexually transmitted infection)     Discharge Instructions      I am treating you today for a urinary tract infection.  Please start antibiotics this evening and take as prescribed. Make sure you are drinking lots of fluids. Please follow-up if you develop any worsening symptoms or any fever.  We have also screening today for STDs.  The clinic will call you if you  need any treatment.  ED Prescriptions     Medication Sig Dispense Auth. Provider   cephALEXin (KEFLEX) 500 MG capsule Take 1 capsule (500 mg total) by mouth 2 (two) times daily for 7 days. 14 capsule Rudolpho Sevin, NP      PDMP not reviewed this encounter.   Rudolpho Sevin, NP 07/19/22 2016

## 2022-07-19 NOTE — Discharge Instructions (Signed)
I am treating you today for a urinary tract infection.  Please start antibiotics this evening and take as prescribed. Make sure you are drinking lots of fluids. Please follow-up if you develop any worsening symptoms or any fever.  We have also screening today for STDs.  The clinic will call you if you need any treatment.

## 2022-07-19 NOTE — ED Triage Notes (Signed)
Pt reports bilateral side pain x 1 week. Pt reports her urine has an odor.

## 2022-07-22 LAB — CERVICOVAGINAL ANCILLARY ONLY
Bacterial Vaginitis (gardnerella): POSITIVE — AB
Candida Glabrata: NEGATIVE
Candida Vaginitis: NEGATIVE
Chlamydia: NEGATIVE
Comment: NEGATIVE
Comment: NEGATIVE
Comment: NEGATIVE
Comment: NEGATIVE
Comment: NEGATIVE
Comment: NORMAL
Neisseria Gonorrhea: NEGATIVE
Trichomonas: NEGATIVE

## 2022-07-22 LAB — URINE CULTURE: Culture: >=100000 — AB

## 2022-07-23 ENCOUNTER — Telehealth (HOSPITAL_COMMUNITY): Payer: Self-pay | Admitting: Emergency Medicine

## 2022-07-23 MED ORDER — METRONIDAZOLE 500 MG PO TABS: 500.0000 mg | ORAL_TABLET | Freq: Two times a day (BID) | ORAL | 0 refills | Status: DC

## 2022-08-08 ENCOUNTER — Inpatient Hospital Stay (HOSPITAL_COMMUNITY): Payer: Medicaid Other

## 2022-08-08 ENCOUNTER — Other Ambulatory Visit: Payer: Self-pay

## 2022-08-08 ENCOUNTER — Inpatient Hospital Stay (HOSPITAL_COMMUNITY)
Admission: AD | Admit: 2022-08-08 | Discharge: 2022-08-08 | Disposition: A | Discharge status: Home / Self Care | Payer: Medicaid Other | Attending: Obstetrics & Gynecology | Admitting: Obstetrics & Gynecology

## 2022-08-08 ENCOUNTER — Encounter (HOSPITAL_COMMUNITY): Payer: Self-pay

## 2022-08-08 DIAGNOSIS — M62830 Muscle spasm of back: Secondary | ICD-10-CM | POA: Insufficient documentation

## 2022-08-08 DIAGNOSIS — Z3A01 Less than 8 weeks gestation of pregnancy: Secondary | ICD-10-CM | POA: Diagnosis not present

## 2022-08-08 DIAGNOSIS — O26891 Other specified pregnancy related conditions, first trimester: Secondary | ICD-10-CM | POA: Insufficient documentation

## 2022-08-08 DIAGNOSIS — O209 Hemorrhage in early pregnancy, unspecified: Secondary | ICD-10-CM | POA: Diagnosis not present

## 2022-08-08 DIAGNOSIS — M545 Low back pain, unspecified: Secondary | ICD-10-CM | POA: Diagnosis not present

## 2022-08-08 DIAGNOSIS — D251 Intramural leiomyoma of uterus: Secondary | ICD-10-CM | POA: Diagnosis not present

## 2022-08-08 DIAGNOSIS — O99891 Other specified diseases and conditions complicating pregnancy: Secondary | ICD-10-CM | POA: Diagnosis not present

## 2022-08-08 DIAGNOSIS — O3411 Maternal care for benign tumor of corpus uteri, first trimester: Secondary | ICD-10-CM | POA: Insufficient documentation

## 2022-08-08 DIAGNOSIS — Z87440 Personal history of urinary (tract) infections: Secondary | ICD-10-CM | POA: Diagnosis not present

## 2022-08-08 DIAGNOSIS — O3680X0 Pregnancy with inconclusive fetal viability, not applicable or unspecified: Secondary | ICD-10-CM

## 2022-08-08 DIAGNOSIS — Z3687 Encounter for antenatal screening for uncertain dates: Secondary | ICD-10-CM | POA: Insufficient documentation

## 2022-08-08 HISTORY — DX: Other specified health status: Z78.9

## 2022-08-08 LAB — URINALYSIS, ROUTINE W REFLEX MICROSCOPIC
Bilirubin Urine: NEGATIVE
Glucose, UA: NEGATIVE mg/dL
Ketones, ur: NEGATIVE mg/dL
Leukocytes,Ua: NEGATIVE
Nitrite: NEGATIVE
Protein, ur: NEGATIVE mg/dL
Specific Gravity, Urine: 1.026 (ref 1.005–1.030)
pH: 5 (ref 5.0–8.0)

## 2022-08-08 LAB — CBC
HCT: 40 % (ref 36.0–46.0)
Hemoglobin: 13.2 g/dL (ref 12.0–15.0)
MCH: 30.7 pg (ref 26.0–34.0)
MCHC: 33 g/dL (ref 30.0–36.0)
MCV: 93 fL (ref 80.0–100.0)
Platelets: 332 10*3/uL (ref 150–400)
RBC: 4.3 MIL/uL (ref 3.87–5.11)
RDW: 13.2 % (ref 11.5–15.5)
WBC: 7.3 10*3/uL (ref 4.0–10.5)
nRBC: 0 % (ref 0.0–0.2)

## 2022-08-08 LAB — WET PREP, GENITAL
Clue Cells Wet Prep HPF POC: NONE SEEN
Sperm: NONE SEEN
Trich, Wet Prep: NONE SEEN
WBC, Wet Prep HPF POC: <10 (ref <10)
Yeast Wet Prep HPF POC: NONE SEEN

## 2022-08-08 LAB — POCT PREGNANCY, URINE: Preg Test, Ur: POSITIVE — AB

## 2022-08-08 LAB — HCG, QUANTITATIVE, PREGNANCY: hCG, Beta Chain, Quant, S: 24019 m[IU]/mL — ABNORMAL HIGH (ref <5)

## 2022-08-08 MED ORDER — CYCLOBENZAPRINE HCL 5 MG PO TABS: 5.0000 mg | ORAL_TABLET | Freq: Three times a day (TID) | ORAL | 0 refills | Status: DC | PRN

## 2022-08-08 NOTE — MAU Note (Signed)
Angela Cortez is a 31 y.o. at 66w1dhere in MAU reporting: was recently treated for UTI, states she took her abx as Rx. Urine started having an odor again on Monday. No other urinary symptoms. Having lower back pain, no abdominal pain. Thinks she pulled something in her back. Seeing some pink discharge.  LMP: 06/26/2022  Onset of complaint: ongoing  Pain score: 10/10  Vitals:   08/08/22 1807  BP: 108/71  Pulse: 92  Resp: 16  Temp: 97.8 F (36.6 C)  SpO2: 97%     FHT:NA  Lab orders placed from triage: ua, upt

## 2022-08-08 NOTE — MAU Provider Note (Incomplete Revision)
History     [id number]  Arrival date and time: 08/08/22 1714    Chief Complaint  Patient presents with   Back Pain   Foul smelling urine     HPI Angela Cortez is a 31 y.o. at 56w1dwho presents for back pain, urinary complaint, & vaginal spotting. Was treated for UTI & BV earlier this month. Completed antibiotics for both as prescribed. Has had intercourse since then.  Currently reports right low back pain that is constant & worse with movement. Also noticed some pink spotting when she wipes today. Has noticed foul smell to urine recently.  Denies fever, abdominal pain, dysuria, or vaginal discharge.    OB History     Gravida  6   Para  2   Term  2   Preterm      AB  2   Living  2      SAB      IAB  2   Ectopic      Multiple  0   Live Births  2           No past medical history on file.  Past Surgical History:  Procedure Laterality Date   ACROMIO-CLAVICULAR JOINT REPAIR     CESAREAN SECTION N/A 12/17/2020   Procedure: CESAREAN SECTION;  Surgeon: AOsborne Oman MD;  Location: [facility];  Service: Obstetrics;  Laterality: N/A;  Nonreassuring FHR Tracing Arrest of Dilation    Family History  Problem Relation Age of Onset   Diabetes Father    Hypertension Father    Cancer Paternal Uncle    Cancer Paternal Grandmother    Asthma Mother     No Known Allergies  No current facility-administered medications on file prior to encounter.   Current Outpatient Medications on File Prior to Encounter  Medication Sig Dispense Refill   metroNIDAZOLE (FLAGYL) 500 MG tablet Take 1 tablet (500 mg total) by mouth 2 (two) times daily. 14 tablet 0     ROS Pertinent positives and negative per HPI, all others reviewed and negative  Physical Exam   BP 108/71 (BP Location: Right Arm)   Pulse 92   Temp 97.8 F (36.6 C) (Oral)   Resp 16   Ht '5\' 3"'$  (1.6 m)   Wt 80 kg   LMP 06/26/2022   SpO2 97% Comment: room air  BMI 31.23 kg/m   Patient Vitals  for the past 24 hrs:  BP Temp Temp src Pulse Resp SpO2 Height Weight  08/08/22 1807 108/71 97.8 F (36.6 C) Oral 92 16 97 % -- --  08/08/22 1803 -- -- -- -- -- -- '5\' 3"'$  (1.6 m) 80 kg    Physical Exam Vitals and nursing note reviewed.  Constitutional:      General: She is not in acute distress.    Appearance: Normal appearance.  HENT:     Head: Normocephalic and atraumatic.  Eyes:     General: No scleral icterus.    Conjunctiva/sclera: Conjunctivae normal.  Pulmonary:     Effort: Pulmonary effort is normal. No respiratory distress.  Abdominal:     Palpations: Abdomen is soft.     Tenderness: There is abdominal tenderness. There is right CVA tenderness and rebound. There is no left CVA tenderness or guarding.  Skin:    General: Skin is warm and dry.  Neurological:     Mental Status: She is alert.      Labs Results for orders placed or performed during the  hospital encounter of 08/08/22 (from the past 24 hour(s))  Pregnancy, urine POC     Status: Abnormal   Collection Time: 08/08/22  6:02 PM  Result Value Ref Range   Preg Test, Ur POSITIVE (A) NEGATIVE  Urinalysis, Routine w reflex microscopic Urine, Clean Catch     Status: Abnormal   Collection Time: 08/08/22  6:14 PM  Result Value Ref Range   Color, Urine AMBER (A) YELLOW   APPearance HAZY (A) CLEAR   Specific Gravity, Urine 1.026 1.005 - 1.030   pH 5.0 5.0 - 8.0   Glucose, UA NEGATIVE NEGATIVE mg/dL   Hgb urine dipstick SMALL (A) NEGATIVE   Bilirubin Urine NEGATIVE NEGATIVE   Ketones, ur NEGATIVE NEGATIVE mg/dL   Protein, ur NEGATIVE NEGATIVE mg/dL   Nitrite NEGATIVE NEGATIVE   Leukocytes,Ua NEGATIVE NEGATIVE   RBC / HPF 0-5 0 - 5 RBC/hpf   WBC, UA 0-5 0 - 5 WBC/hpf   Bacteria, UA RARE (A) NONE SEEN   Squamous Epithelial / LPF 0-5 0 - 5 /HPF   Mucus PRESENT   Wet prep, genital     Status: None   Collection Time: 08/08/22  7:40 PM  Result Value Ref Range   Yeast Wet Prep HPF POC NONE SEEN NONE SEEN   Trich,  Wet Prep NONE SEEN NONE SEEN   Clue Cells Wet Prep HPF POC NONE SEEN NONE SEEN   WBC, Wet Prep HPF POC <10 <10   Sperm NONE SEEN     Imaging No results found.  MAU Course  Procedures Lab Orders         Wet prep, genital         Culture, OB Urine         Urinalysis, Routine w reflex microscopic Urine, Clean Catch         CBC         hCG, quantitative, pregnancy         Pregnancy, urine POC    No orders of the defined types were placed in this encounter.  Imaging Orders         US OB LESS THAN 14 WEEKS WITH OB TRANSVAGINAL      MDM U/a appears improved from previous sample. Will send for culture Wet prep negative RH positive  Labs & imaging in process  Care turned over to [place], NP 08/08/2022 8:16 PM  Assessment and Plan

## 2022-08-08 NOTE — MAU Provider Note (Addendum)
History     [id number]  Arrival date and time: 08/08/22 1714    Chief Complaint  Patient presents with   Back Pain   Foul smelling urine     HPI Angela Cortez is a 31 y.o. at 42w1dwho presents for back pain, urinary complaint, & vaginal spotting. Was treated for UTI & BV earlier this month. Completed antibiotics for both as prescribed. Has had intercourse since then.  Currently reports right low back pain that is constant & worse with movement. Also noticed some pink spotting when she wipes today. Has noticed foul smell to urine recently.  Denies fever, abdominal pain, dysuria, or vaginal discharge.    OB History     Gravida  6   Para  2   Term  2   Preterm      AB  2   Living  2      SAB      IAB  2   Ectopic      Multiple  0   Live Births  2           No past medical history on file.  Past Surgical History:  Procedure Laterality Date   ACROMIO-CLAVICULAR JOINT REPAIR     CESAREAN SECTION N/A 12/17/2020   Procedure: CESAREAN SECTION;  Surgeon: AOsborne Oman MD;  Location: [facility];  Service: Obstetrics;  Laterality: N/A;  Nonreassuring FHR Tracing Arrest of Dilation    Family History  Problem Relation Age of Onset   Diabetes Father    Hypertension Father    Cancer Paternal Uncle    Cancer Paternal Grandmother    Asthma Mother     No Known Allergies  No current facility-administered medications on file prior to encounter.   Current Outpatient Medications on File Prior to Encounter  Medication Sig Dispense Refill   metroNIDAZOLE (FLAGYL) 500 MG tablet Take 1 tablet (500 mg total) by mouth 2 (two) times daily. 14 tablet 0     Review of Systems  Constitutional:  Negative for chills and fever.  Gastrointestinal:  Negative for abdominal pain.  Genitourinary:  Negative for dysuria.  Musculoskeletal:  Positive for back pain.   Pertinent positives and negative per HPI, all others reviewed and negative  Physical Exam   BP  108/71 (BP Location: Right Arm)   Pulse 92   Temp 97.8 F (36.6 C) (Oral)   Resp 16   Ht '5\' 3"'$  (1.6 m)   Wt 80 kg   LMP 06/26/2022   SpO2 97% Comment: room air  BMI 31.23 kg/m   Patient Vitals for the past 24 hrs:  BP Temp Temp src Pulse Resp SpO2 Height Weight  08/08/22 1807 108/71 97.8 F (36.6 C) Oral 92 16 97 % -- --  08/08/22 1803 -- -- -- -- -- -- '5\' 3"'$  (1.6 m) 80 kg    Physical Exam Vitals and nursing note reviewed.  Constitutional:      General: She is not in acute distress.    Appearance: Normal appearance.  HENT:     Head: Normocephalic and atraumatic.  Eyes:     General: No scleral icterus.    Conjunctiva/sclera: Conjunctivae normal.  Pulmonary:     Effort: Pulmonary effort is normal. No respiratory distress.  Abdominal:     Palpations: Abdomen is soft.     Tenderness: There is abdominal tenderness. There is right CVA tenderness and rebound. There is no left CVA tenderness or guarding.  Skin:  General: Skin is warm and dry.  Neurological:     Mental Status: She is alert.      Labs Results for orders placed or performed during the hospital encounter of 08/08/22 (from the past 24 hour(s))  Pregnancy, urine POC     Status: Abnormal   Collection Time: 08/08/22  6:02 PM  Result Value Ref Range   Preg Test, Ur POSITIVE (A) NEGATIVE  Urinalysis, Routine w reflex microscopic Urine, Clean Catch     Status: Abnormal   Collection Time: 08/08/22  6:14 PM  Result Value Ref Range   Color, Urine AMBER (A) YELLOW   APPearance HAZY (A) CLEAR   Specific Gravity, Urine 1.026 1.005 - 1.030   pH 5.0 5.0 - 8.0   Glucose, UA NEGATIVE NEGATIVE mg/dL   Hgb urine dipstick SMALL (A) NEGATIVE   Bilirubin Urine NEGATIVE NEGATIVE   Ketones, ur NEGATIVE NEGATIVE mg/dL   Protein, ur NEGATIVE NEGATIVE mg/dL   Nitrite NEGATIVE NEGATIVE   Leukocytes,Ua NEGATIVE NEGATIVE   RBC / HPF 0-5 0 - 5 RBC/hpf   WBC, UA 0-5 0 - 5 WBC/hpf   Bacteria, UA RARE (A) NONE SEEN   Squamous  Epithelial / LPF 0-5 0 - 5 /HPF   Mucus PRESENT   Wet prep, genital     Status: None   Collection Time: 08/08/22  7:40 PM  Result Value Ref Range   Yeast Wet Prep HPF POC NONE SEEN NONE SEEN   Trich, Wet Prep NONE SEEN NONE SEEN   Clue Cells Wet Prep HPF POC NONE SEEN NONE SEEN   WBC, Wet Prep HPF POC <10 <10   Sperm NONE SEEN    Imaging No results found.  MAU Course  Procedures Lab Orders         Wet prep, genital         Culture, OB Urine         Urinalysis, Routine w reflex microscopic Urine, Clean Catch         CBC         hCG, quantitative, pregnancy         Pregnancy, urine POC    No orders of the defined types were placed in this encounter.  Imaging Orders         US OB LESS THAN 14 WEEKS WITH OB TRANSVAGINAL      MDM U/a appears improved from previous sample. Will send for culture Wet prep negative RH positive  Labs & imaging in process  Care turned over to [place], NP 08/08/2022 8:16 PM   Results for orders placed or performed during the hospital encounter of 08/08/22 (from the past 24 hour(s))  Pregnancy, urine POC     Status: Abnormal   Collection Time: 08/08/22  6:02 PM  Result Value Ref Range   Preg Test, Ur POSITIVE (A) NEGATIVE  Urinalysis, Routine w reflex microscopic Urine, Clean Catch     Status: Abnormal   Collection Time: 08/08/22  6:14 PM  Result Value Ref Range   Color, Urine AMBER (A) YELLOW   APPearance HAZY (A) CLEAR   Specific Gravity, Urine 1.026 1.005 - 1.030   pH 5.0 5.0 - 8.0   Glucose, UA NEGATIVE NEGATIVE mg/dL   Hgb urine dipstick SMALL (A) NEGATIVE   Bilirubin Urine NEGATIVE NEGATIVE   Ketones, ur NEGATIVE NEGATIVE mg/dL   Protein, ur NEGATIVE NEGATIVE mg/dL   Nitrite NEGATIVE NEGATIVE   Leukocytes,Ua NEGATIVE NEGATIVE   RBC /  HPF 0-5 0 - 5 RBC/hpf   WBC, UA 0-5 0 - 5 WBC/hpf   Bacteria, UA RARE (A) NONE SEEN   Squamous Epithelial / LPF 0-5 0 - 5 /HPF   Mucus PRESENT   Wet prep, genital      Status: None   Collection Time: 08/08/22  7:40 PM  Result Value Ref Range   Yeast Wet Prep HPF POC NONE SEEN NONE SEEN   Trich, Wet Prep NONE SEEN NONE SEEN   Clue Cells Wet Prep HPF POC NONE SEEN NONE SEEN   WBC, Wet Prep HPF POC <10 <10   Sperm NONE SEEN   CBC     Status: None   Collection Time: 08/08/22  8:33 PM  Result Value Ref Range   WBC 7.3 4.0 - 10.5 K/uL   RBC 4.30 3.87 - 5.11 MIL/uL   Hemoglobin 13.2 12.0 - 15.0 g/dL   HCT 40.0 36.0 - 46.0 %   MCV 93.0 80.0 - 100.0 fL   MCH 30.7 26.0 - 34.0 pg   MCHC 33.0 30.0 - 36.0 g/dL   RDW 13.2 11.5 - 15.5 %   Platelets 332 150 - 400 K/uL   nRBC 0.0 0.0 - 0.2 %  hCG, quantitative, pregnancy     Status: Abnormal   Collection Time: 08/08/22  8:33 PM  Result Value Ref Range   hCG, Beta Chain, Quant, S 24,019 (H) <5 mIU/mL   US OB LESS THAN 14 WEEKS WITH OB TRANSVAGINAL  Result Date: 08/08/2022 CLINICAL DATA:  [id number] Vaginal bleeding in pregnancy, first trimester [id number] EXAM: OBSTETRIC <14 WK [place] AND TRANSVAGINAL OB US TECHNIQUE: Transvaginal ultrasound was performed for complete evaluation of the gestation as well as the maternal uterus, adnexal regions, and pelvic cul-de-sac. COMPARISON:  12/16/2020. FINDINGS: Intrauterine gestational sac: Single Yolk sac:  Visualized. Embryo:  Visualized. Cardiac Activity: Visualized. Heart Rate: 95 bpm CRL:   0.3 cm = 5 weeks 6 days [place] EDC: 04/04/2023 Subchorionic hemorrhage:  None visualized. Adnexa: No masses or fluid collections. Posterior intramural fibroid measuring 1.1 cm. IMPRESSION: Single viable intrauterine 5 weeks day pregnancy with fetal heart motion. Ultrasound [facility] 04/04/2023. There is a small fibroid. Electronically Signed   By: Sammie Bench M.D.   On: 08/08/2022 21:38     Discussed results with patient Reviewed would recommend starting prenatal care Pelvic rest DIscussed muscle spasm in low back (states she injured it last night).  Will prescribe Flexeril for pain   Discussed ice  and heat and tips given on mobility and PT  Assessment and Plan  A:  [place] IUP at [redacted]w[redacted]d      Bleeding in early pregnancy       Low back spasm  P;  Discharge home       Rx Flexeril for back pain       Conservative care for back pain       Followup in office for prenatal care       Encouraged to return if she develops worsening of symptoms, increase in pain, fever, or other concerning symptoms.   WSeabron Spates CNM

## 2022-08-10 LAB — CULTURE, OB URINE
Culture: >=100000 — AB
Special Requests: NORMAL

## 2022-08-11 ENCOUNTER — Other Ambulatory Visit: Payer: Self-pay | Admitting: Advanced Practice Midwife

## 2022-08-11 ENCOUNTER — Encounter: Payer: Self-pay | Admitting: Advanced Practice Midwife

## 2022-08-11 DIAGNOSIS — O2341 Unspecified infection of urinary tract in pregnancy, first trimester: Secondary | ICD-10-CM

## 2022-08-11 MED ORDER — CEFADROXIL 500 MG PO CAPS: 500.0000 mg | ORAL_CAPSULE | Freq: Two times a day (BID) | ORAL | 0 refills | Status: DC

## 2022-08-11 NOTE — Progress Notes (Signed)
+   UTI per MAU urine culture. Will notify patient via active MyChart.  Mallie Snooks, [place], MSN, CNM Certified Nurse Midwife, Product/process development scientist for [employer], [place]

## 2022-08-12 ENCOUNTER — Other Ambulatory Visit: Payer: Self-pay | Admitting: Advanced Practice Midwife

## 2022-08-12 DIAGNOSIS — Z419 Encounter for procedure for purposes other than remedying health state, unspecified: Secondary | ICD-10-CM | POA: Diagnosis not present

## 2022-08-12 MED ORDER — CEFADROXIL 500 MG PO CAPS: 500.0000 mg | ORAL_CAPSULE | Freq: Two times a day (BID) | ORAL | 0 refills | Status: DC

## 2022-08-12 NOTE — Progress Notes (Signed)
Patient's call returned. Pharmacy updated. Rx resent to [facility] on [address].  Mallie Snooks, [place], MSN, CNM Certified Nurse Midwife, Product/process development scientist for [employer], [place]

## 2022-08-15 ENCOUNTER — Other Ambulatory Visit: Payer: Self-pay | Admitting: Emergency Medicine

## 2022-08-15 MED ORDER — DICLEGIS 10-10 MG PO TBEC: DELAYED_RELEASE_TABLET | ORAL | 0 refills | Status: DC

## 2022-08-15 NOTE — Progress Notes (Signed)
Rx for nausea

## 2022-09-09 ENCOUNTER — Encounter: Payer: Medicaid Other | Admitting: Family Medicine

## 2022-09-12 DIAGNOSIS — Z419 Encounter for procedure for purposes other than remedying health state, unspecified: Secondary | ICD-10-CM | POA: Diagnosis not present

## 2022-10-11 DIAGNOSIS — Z419 Encounter for procedure for purposes other than remedying health state, unspecified: Secondary | ICD-10-CM | POA: Diagnosis not present

## 2022-11-04 ENCOUNTER — Encounter (HOSPITAL_COMMUNITY): Payer: Self-pay | Admitting: Emergency Medicine

## 2022-11-04 ENCOUNTER — Ambulatory Visit (HOSPITAL_COMMUNITY)
Admission: EM | Admit: 2022-11-04 | Discharge: 2022-11-04 | Disposition: A | Discharge status: Home / Self Care | Payer: Medicaid Other | Attending: Nurse Practitioner | Admitting: Nurse Practitioner

## 2022-11-04 DIAGNOSIS — Z113 Encounter for screening for infections with a predominantly sexual mode of transmission: Secondary | ICD-10-CM | POA: Diagnosis not present

## 2022-11-04 DIAGNOSIS — N760 Acute vaginitis: Secondary | ICD-10-CM | POA: Diagnosis not present

## 2022-11-04 DIAGNOSIS — R102 Pelvic and perineal pain: Secondary | ICD-10-CM

## 2022-11-04 LAB — POCT URINALYSIS DIPSTICK, ED / UC
Bilirubin Urine: NEGATIVE
Glucose, UA: NEGATIVE mg/dL
Nitrite: NEGATIVE
Protein, ur: NEGATIVE mg/dL
Specific Gravity, Urine: 1.025 (ref 1.005–1.030)
Urobilinogen, UA: 0.2 mg/dL (ref 0.0–1.0)
pH: 6.5 (ref 5.0–8.0)

## 2022-11-04 LAB — POC URINE PREG, ED: Preg Test, Ur: NEGATIVE

## 2022-11-04 MED ORDER — METRONIDAZOLE 500 MG PO TABS: 500.0000 mg | ORAL_TABLET | Freq: Two times a day (BID) | ORAL | 0 refills | Status: DC

## 2022-11-04 NOTE — ED Provider Notes (Signed)
[name]    CSN: [id number] Arrival date & time: 11/04/22  1826      History   Chief Complaint Chief Complaint  Patient presents with   Abdominal Pain    HPI Angela Cortez is a 32 y.o. female.   Subjective:   Angela Cortez is a 32 y.o. female who presents for evaluation of vaginal discharge and lower abdominal pain.  Symptoms have been present for the past week.  Patient says that her vaginal discharge does have some odor.  Patient is unclear what color the discharge is as she recently had her Depo injection and is currently spotting.  Patient denies any fevers, nausea, vomiting, dysuria, vaginal itching, rash or sores.  She is sexually active and has had only 1 female partner over the past 3 months.  She does not use condoms.  Patient reports that she feels as if she has bacterial vaginosis.  Patient states that she has had this several times before and her current symptoms feel similar.  Patient also requesting STD testing at this time.    The following portions of the patient's history were reviewed and updated as appropriate: allergies, current medications, past family history, past medical history, past social history, past surgical history, and problem list.       Past Medical History:  Diagnosis Date   Medical history non-contributory     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   ASCUS of cervix with negative high risk HPV 05/31/2020   Obesity in pregnancy 05/28/2020   Mild tetrahydrocannabinol (THC) abuse 10/15/2016    Past Surgical History:  Procedure Laterality Date   ACROMIO-CLAVICULAR JOINT REPAIR     CESAREAN SECTION N/A 12/17/2020   Procedure: CESAREAN SECTION;  Surgeon: Osborne Oman, MD;  Location: [facility];  Service: Obstetrics;  Laterality: N/A;  Nonreassuring FHR Tracing Arrest of Dilation    OB History     Gravida  6   Para  3   Term  3   Preterm      AB  2   Living  2       SAB      IAB  2   Ectopic      Multiple  0   Live Births  2            Home Medications    Prior to Admission medications   Medication Sig Start Date End Date Taking? Authorizing Provider  metroNIDAZOLE (FLAGYL) 500 MG tablet Take 1 tablet (500 mg total) by mouth 2 (two) times daily. 11/04/22  Yes Enrique Sack, FNP    Family History Family History  Problem Relation Age of Onset   Diabetes Father    Hypertension Father    Cancer Paternal Uncle    Cancer Paternal Grandmother    Asthma Mother     Social History Social History   Tobacco Use   Smoking status: Former    Packs/day: .25    Types: Cigarettes    Quit date: 02/04/2016    Years since quitting: 6.7   Smokeless tobacco: Never  Vaping Use   Vaping Use: Former  Substance Use Topics   Alcohol use: Yes    Comment: socially   Drug use: Yes    Types: Marijuana     Allergies   Patient has no known allergies.   Review of Systems Review of Systems  Constitutional:  Negative for fever.  Gastrointestinal:  Positive  for abdominal pain. Negative for nausea and vomiting.  Genitourinary:  Positive for vaginal discharge. Negative for dysuria and frequency.  Musculoskeletal:  Negative for back pain.  All other systems reviewed and are negative.    Physical Exam Triage Vital Signs ED Triage Vitals  Enc Vitals Group     BP 11/04/22 2026 107/75     Pulse Rate 11/04/22 2026 74     Resp 11/04/22 2026 15     Temp 11/04/22 2026 99 F (37.2 C)     Temp Source 11/04/22 2026 Oral     SpO2 11/04/22 2026 98 %     Weight --      Height --      Head Circumference --      Peak Flow --      Pain Score 11/04/22 2025 5     Pain Loc --      Pain Edu? --      Excl. in [place]? --    No data found.  Updated Vital Signs BP 107/75 (BP Location: Left Arm)   Pulse 74   Temp 99 F (37.2 C) (Oral)   Resp 15   LMP 06/26/2022   SpO2 98%   Visual Acuity Right Eye Distance:   Left Eye Distance:   Bilateral  Distance:    Right Eye Near:   Left Eye Near:    Bilateral Near:     Physical Exam Vitals reviewed.  Constitutional:      General: She is not in acute distress.    Appearance: She is well-developed. She is not ill-appearing or toxic-appearing.  HENT:     Head: Normocephalic.  Cardiovascular:     Rate and Rhythm: Normal rate.  Pulmonary:     Effort: Pulmonary effort is normal.  Abdominal:     General: Bowel sounds are normal.     Palpations: Abdomen is soft.     Tenderness: There is no abdominal tenderness. There is no right CVA tenderness or left CVA tenderness.  Genitourinary:    Comments: Deferred.  Patient perform self swab for testing. Skin:    General: Skin is warm and dry.  Neurological:     General: No focal deficit present.     Mental Status: She is alert and oriented to person, place, and time.      UC Treatments / Results  Labs (all labs ordered are listed, but only abnormal results are displayed) Labs Reviewed  POCT URINALYSIS DIPSTICK, ED / UC - Abnormal; Notable for the following components:      Result Value   Ketones, ur TRACE (*)    Hgb urine dipstick SMALL (*)    Leukocytes,Ua TRACE (*)    All other components within normal limits  URINE CULTURE  POC URINE PREG, ED  CERVICOVAGINAL ANCILLARY ONLY    EKG   Radiology No results found.  Procedures Procedures (including critical care time)  Medications Ordered in UC Medications - No data to display  Initial Impression / Assessment and Plan / UC Course  I have reviewed the triage vital signs and the nursing notes.  Pertinent labs & imaging results that were available during my care of the patient were reviewed by me and considered in my medical decision making (see chart for details).     33 year old female presenting with lower abdominal pain and vaginal discharge.  Urinalysis unremarkable.  Urine pregnancy negative.  Patient has low-grade fever of 99.  Vital signs stable.  Physical exam  as above.  Pelvic exam deferred.  Patient perform self swab for testing.  Will treat with Flagyl.  Further treatment pending results of outstanding test. Abstinence from intercourse discussed. Partner notification discussed. Discussed safe sex.  Today's evaluation has revealed no signs of a dangerous process. Discussed diagnosis with patient and/or guardian. Patient and/or guardian aware of their diagnosis, possible red flag symptoms to watch out for and need for close follow up. Patient and/or guardian understands verbal and written discharge instructions. Patient and/or guardian comfortable with plan and disposition.  Patient and/or guardian has a clear mental status at this time, good insight into illness (after discussion and teaching) and has clear judgment to make decisions regarding their care  Documentation was completed with the aid of voice recognition software. Transcription may contain typographical errors. Final Clinical Impressions(s) / UC Diagnoses   Final diagnoses:  Suprapubic pain  Vaginitis and vulvovaginitis  Screen for STD (sexually transmitted disease)     Discharge Instructions      Testing for gonorrhea, chlamydia and trichomonas is pending. You should not have any sexual activity until you receive the results of the tests. You will only be notified for positive results. You may go online to [facility] and review your results. Practice safe sex practices by wearing a condom every time you have sex. Remember that people who have STIs may not experience any symptoms. However, even without symptoms, these infections can be spread from person to person and require treatment. STIs can be treated, and many STIs can be cured. However, some STIs cannot be cured and will affect you for the rest of your life. It's important to be checked regularly for STIs. You should also consider taking pre-exposure prophylaxis (PrEP) to prevent HIV infection.     ED Prescriptions     Medication  Sig Dispense Auth. Provider   metroNIDAZOLE (FLAGYL) 500 MG tablet Take 1 tablet (500 mg total) by mouth 2 (two) times daily. 14 tablet Enrique Sack, FNP      PDMP not reviewed this encounter.   Enrique Sack, [place] 11/04/22 2109

## 2022-11-04 NOTE — ED Triage Notes (Signed)
Pt had abortion in January and was told that was given medications for infection. Pt c/o mid lower abd pains since last week. Reports just started on Depo shot, recently had some vaginal spotting. Pt reports noticed swelling in left groin area. Wants testing for STDs. Hasn't taken medications for pain

## 2022-11-04 NOTE — Discharge Instructions (Addendum)
Testing for gonorrhea, chlamydia and trichomonas is pending. You should not have any sexual activity until you receive the results of the tests. You will only be notified for positive results. You may go online to MyChart and review your results. Practice safe sex practices by wearing a condom every time you have sex. Remember that people who have STIs may not experience any symptoms. However, even without symptoms, these infections can be spread from person to person and require treatment. STIs can be treated, and many STIs can be cured. However, some STIs cannot be cured and will affect you for the rest of your life. It's important to be checked regularly for STIs. You should also consider taking pre-exposure prophylaxis (PrEP) to prevent HIV infection. 

## 2022-11-05 LAB — CERVICOVAGINAL ANCILLARY ONLY
Bacterial Vaginitis (gardnerella): NEGATIVE
Candida Glabrata: NEGATIVE
Candida Vaginitis: NEGATIVE
Chlamydia: NEGATIVE
Comment: NEGATIVE
Comment: NEGATIVE
Comment: NEGATIVE
Comment: NEGATIVE
Comment: NEGATIVE
Comment: NORMAL
Neisseria Gonorrhea: NEGATIVE
Trichomonas: NEGATIVE

## 2022-11-05 LAB — URINE CULTURE: Culture: NO GROWTH

## 2022-11-11 DIAGNOSIS — Z419 Encounter for procedure for purposes other than remedying health state, unspecified: Secondary | ICD-10-CM | POA: Diagnosis not present

## 2022-12-11 DIAGNOSIS — Z419 Encounter for procedure for purposes other than remedying health state, unspecified: Secondary | ICD-10-CM | POA: Diagnosis not present

## 2023-01-11 DIAGNOSIS — Z419 Encounter for procedure for purposes other than remedying health state, unspecified: Secondary | ICD-10-CM | POA: Diagnosis not present

## 2023-02-10 ENCOUNTER — Ambulatory Visit (HOSPITAL_COMMUNITY)
Admission: EM | Admit: 2023-02-10 | Discharge: 2023-02-10 | Disposition: A | Discharge status: Home / Self Care | Payer: Commercial Managed Care - HMO | Attending: Urgent Care | Admitting: Urgent Care

## 2023-02-10 ENCOUNTER — Encounter (HOSPITAL_COMMUNITY): Payer: Self-pay

## 2023-02-10 DIAGNOSIS — Z419 Encounter for procedure for purposes other than remedying health state, unspecified: Secondary | ICD-10-CM | POA: Diagnosis not present

## 2023-02-10 DIAGNOSIS — N760 Acute vaginitis: Secondary | ICD-10-CM | POA: Insufficient documentation

## 2023-02-10 DIAGNOSIS — B9689 Other specified bacterial agents as the cause of diseases classified elsewhere: Secondary | ICD-10-CM | POA: Insufficient documentation

## 2023-02-10 LAB — POCT URINALYSIS DIP (MANUAL ENTRY)
Bilirubin, UA: NEGATIVE
Glucose, UA: NEGATIVE mg/dL
Ketones, POC UA: NEGATIVE mg/dL
Nitrite, UA: NEGATIVE
Protein Ur, POC: NEGATIVE mg/dL
Spec Grav, UA: 1.02 (ref 1.010–1.025)
Urobilinogen, UA: 0.2 E.U./dL
pH, UA: 6.5 (ref 5.0–8.0)

## 2023-02-10 LAB — POCT URINE PREGNANCY: Preg Test, Ur: NEGATIVE

## 2023-02-10 MED ORDER — METRONIDAZOLE 500 MG PO TABS: 500.0000 mg | ORAL_TABLET | Freq: Two times a day (BID) | ORAL | 0 refills | Status: DC

## 2023-02-10 NOTE — ED Triage Notes (Signed)
Pt is here for STD testing. Pt thinks she has BV.

## 2023-02-10 NOTE — Discharge Instructions (Addendum)
You were tested today for gonorrhea, chlamydia, trichomonas, BV, and yeast.  We will call you with the results of your test once received.  Please avoid all forms of intercourse until test results received, and if positive for any STI, all partners will need to complete entire course of antibiotics prior to resuming.  As always, practice safer sexual practices by using protection each and every time, and limiting number of partners.   Start taking flagyl twice daily until gone. Do not drink any alcohol while on this medication.

## 2023-02-10 NOTE — ED Provider Notes (Signed)
[facility]    CSN: [id number] Arrival date & time: 02/10/23  1847      History   Chief Complaint Chief Complaint  Patient presents with   Exposure to STD    HPI Angela Cortez is a 32 y.o. female.   32 year old female with a known history of recurrent bacterial vaginosis presents today due to concern of another episode.  She reports some pelvic cramping for the past 2 days.  Recently off menses.  States there is a vaginal odor which is consistent with prior BV.  She denies dysuria, hematuria, lesions, pruritus.  Patient denies a known exposure to STI. States usually she will get inguinal lymphadenopathy during these episodes but has not noted any this time.     Past Medical History:  Diagnosis Date   Medical history non-contributory     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   ASCUS of cervix with negative high risk HPV 05/31/2020   Obesity in pregnancy 05/28/2020   Mild tetrahydrocannabinol (THC) abuse 10/15/2016    Past Surgical History:  Procedure Laterality Date   ACROMIO-CLAVICULAR JOINT REPAIR     CESAREAN SECTION N/A 12/17/2020   Procedure: CESAREAN SECTION;  Surgeon: Tereso Newcomer, MD;  Location: [facility];  Service: Obstetrics;  Laterality: N/A;  Nonreassuring FHR Tracing Arrest of Dilation    OB History     Gravida  6   Para  3   Term  3   Preterm      AB  2   Living  2      SAB      IAB  2   Ectopic      Multiple  0   Live Births  2            Home Medications    Prior to Admission medications   Medication Sig Start Date End Date Taking? Authorizing Provider  metroNIDAZOLE (FLAGYL) 500 MG tablet Take 1 tablet (500 mg total) by mouth 2 (two) times daily. 02/10/23   Maretta Bees, PA    Family History Family History  Problem Relation Age of Onset   Diabetes Father    Hypertension Father    Cancer Paternal Uncle    Cancer Paternal Grandmother    Asthma Mother      Social History Social History   Tobacco Use   Smoking status: Former    Packs/day: .25    Types: Cigarettes    Quit date: 02/04/2016    Years since quitting: 7.0   Smokeless tobacco: Never  Vaping Use   Vaping Use: Former  Substance Use Topics   Alcohol use: Yes    Comment: socially   Drug use: Yes    Types: Marijuana     Allergies   Patient has no known allergies.   Review of Systems Review of Systems As per HPI  Physical Exam Triage Vital Signs ED Triage Vitals [02/10/23 1933]  Enc Vitals Group     BP 132/83     Pulse Rate 83     Resp 18     Temp 98.4 F (36.9 C)     Temp Source Oral     SpO2 98 %     Weight      Height      Head Circumference      Peak Flow      Pain Score      Pain Loc  Pain Edu?      Excl. in [place]?    No data found.  Updated Vital Signs BP 132/83 (BP Location: Left Arm)   Pulse 83   Temp 98.4 F (36.9 C) (Oral)   Resp 18   LMP 07/15/2022   SpO2 98%   Visual Acuity Right Eye Distance:   Left Eye Distance:   Bilateral Distance:    Right Eye Near:   Left Eye Near:    Bilateral Near:     Physical Exam Vitals and nursing note reviewed.  Constitutional:      General: She is not in acute distress.    Appearance: Normal appearance. She is well-developed. She is not ill-appearing, toxic-appearing or diaphoretic.  HENT:     Head: Normocephalic and atraumatic.  Eyes:     Conjunctiva/sclera: Conjunctivae normal.  Cardiovascular:     Rate and Rhythm: Normal rate and regular rhythm.  Pulmonary:     Effort: Pulmonary effort is normal. No respiratory distress.     Breath sounds: Normal breath sounds.  Abdominal:     General: Abdomen is flat. There is no distension.     Palpations: Abdomen is soft. There is no mass.     Tenderness: There is no abdominal tenderness. There is no right CVA tenderness, left CVA tenderness, guarding or rebound.  Genitourinary:    Comments: Exam offered, declined Musculoskeletal:         General: No swelling.     Cervical back: Neck supple.  Skin:    General: Skin is warm and dry.     Capillary Refill: Capillary refill takes less than 2 seconds.     Coloration: Skin is not jaundiced.     Findings: No bruising, erythema or rash.  Neurological:     Mental Status: She is alert.  Psychiatric:        Mood and Affect: Mood normal.      UC Treatments / Results  Labs (all labs ordered are listed, but only abnormal results are displayed) Labs Reviewed  POCT URINALYSIS DIP (MANUAL ENTRY) - Abnormal; Notable for the following components:      Result Value   Blood, UA trace-lysed (*)    Leukocytes, UA Small (1+) (*)    All other components within normal limits  POCT URINE PREGNANCY  CERVICOVAGINAL ANCILLARY ONLY    EKG   Radiology No results found.  Procedures Procedures (including critical care time)  Medications Ordered in UC Medications - No data to display  Initial Impression / Assessment and Plan / UC Course  I have reviewed the triage vital signs and the nursing notes.  Pertinent labs & imaging results that were available during my care of the patient were reviewed by me and considered in my medical decision making (see chart for details).     Vaginitis -patient reports symptoms consistent with prior bacterial vaginosis infections.  Will start metronidazole twice daily.  Aptima swab was collected.  Small leuks noted in UA however patient is asymptomatic therefore no culture indicated at this time.   Final Clinical Impressions(s) / UC Diagnoses   Final diagnoses:  Bacterial vaginosis     Discharge Instructions      You were tested today for gonorrhea, chlamydia, trichomonas, BV, and yeast.  We will call you with the results of your test once received.  Please avoid all forms of intercourse until test results received, and if positive for any STI, all partners will need to complete entire course of antibiotics prior to  resuming.  As always,  practice safer sexual practices by using protection each and every time, and limiting number of partners.   Start taking flagyl twice daily until gone. Do not drink any alcohol while on this medication.     ED Prescriptions     Medication Sig Dispense Auth. Provider   metroNIDAZOLE (FLAGYL) 500 MG tablet Take 1 tablet (500 mg total) by mouth 2 (two) times daily. 14 tablet Sanela Evola L, [place]      PDMP not reviewed this encounter.   Maretta Bees, [place] 02/10/23 2107

## 2023-02-11 LAB — CERVICOVAGINAL ANCILLARY ONLY
Bacterial Vaginitis (gardnerella): POSITIVE — AB
Candida Glabrata: NEGATIVE
Candida Vaginitis: POSITIVE — AB
Chlamydia: NEGATIVE
Comment: NEGATIVE
Comment: NEGATIVE
Comment: NEGATIVE
Comment: NEGATIVE
Comment: NEGATIVE
Comment: NORMAL
Neisseria Gonorrhea: NEGATIVE
Trichomonas: POSITIVE — AB

## 2023-02-12 ENCOUNTER — Telehealth (HOSPITAL_COMMUNITY): Payer: Self-pay | Admitting: Emergency Medicine

## 2023-02-12 MED ORDER — FLUCONAZOLE 150 MG PO TABS: 150.0000 mg | ORAL_TABLET | Freq: Once | ORAL | 0 refills | Status: AC

## 2023-03-03 ENCOUNTER — Ambulatory Visit (HOSPITAL_COMMUNITY)
Admission: EM | Admit: 2023-03-03 | Discharge: 2023-03-03 | Disposition: A | Discharge status: Home / Self Care | Payer: Commercial Managed Care - HMO | Attending: Family Medicine | Admitting: Family Medicine

## 2023-03-03 ENCOUNTER — Encounter (HOSPITAL_COMMUNITY): Payer: Self-pay

## 2023-03-03 DIAGNOSIS — Z8619 Personal history of other infectious and parasitic diseases: Secondary | ICD-10-CM | POA: Diagnosis not present

## 2023-03-03 DIAGNOSIS — R102 Pelvic and perineal pain: Secondary | ICD-10-CM | POA: Diagnosis not present

## 2023-03-03 DIAGNOSIS — N760 Acute vaginitis: Secondary | ICD-10-CM | POA: Insufficient documentation

## 2023-03-03 DIAGNOSIS — Z8742 Personal history of other diseases of the female genital tract: Secondary | ICD-10-CM | POA: Insufficient documentation

## 2023-03-03 DIAGNOSIS — B9689 Other specified bacterial agents as the cause of diseases classified elsewhere: Secondary | ICD-10-CM | POA: Insufficient documentation

## 2023-03-03 MED ORDER — METRONIDAZOLE 500 MG PO TABS: 500.0000 mg | ORAL_TABLET | Freq: Two times a day (BID) | ORAL | 0 refills | Status: AC

## 2023-03-03 NOTE — ED Provider Notes (Signed)
[facility]    CSN: [id number] Arrival date & time: 03/03/23  1855      History   Chief Complaint Chief Complaint  Patient presents with   Abdominal Pain    HPI Angela Cortez is a 32 y.o. female.    Abdominal Pain Here for some abdominal cramping that has been bothering her for 2 or 3 days.  No dysuria and no fever or chills.  No vomiting and no abdominal pain.  3 weeks ago she was seen here in had a vaginal swab done.  She was positive for trichomoniasis, BV, and Candida.  At that time she was having some abdominal cramping.  She took her metronidazole and her partner told her that he also was treated.  Shortly after they had intercourse in the last week, she began having this abdominal cramping again and is having some discharge also.  Last menstrual cycle was in December of last year.  She states she was on Depo-Provera until the last injection in February or March.  She has done home pregnancy tests in the last few days that were negative.  Past Medical History:  Diagnosis Date   Medical history non-contributory     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   ASCUS of cervix with negative high risk HPV 05/31/2020   Obesity in pregnancy 05/28/2020   Mild tetrahydrocannabinol (THC) abuse 10/15/2016    Past Surgical History:  Procedure Laterality Date   ACROMIO-CLAVICULAR JOINT REPAIR     CESAREAN SECTION N/A 12/17/2020   Procedure: CESAREAN SECTION;  Surgeon: Tereso Newcomer, MD;  Location: [facility];  Service: Obstetrics;  Laterality: N/A;  Nonreassuring FHR Tracing Arrest of Dilation    OB History     Gravida  6   Para  3   Term  3   Preterm      AB  2   Living  2      SAB      IAB  2   Ectopic      Multiple  0   Live Births  2            Home Medications    Prior to Admission medications   Medication Sig Start Date End Date Taking? Authorizing Provider  metroNIDAZOLE (FLAGYL) 500  MG tablet Take 1 tablet (500 mg total) by mouth 2 (two) times daily for 7 days. 03/03/23 03/10/23 Yes Sagrario Lineberry, Janace Aris, MD    Family History Family History  Problem Relation Age of Onset   Diabetes Father    Hypertension Father    Cancer Paternal Uncle    Cancer Paternal Grandmother    Asthma Mother     Social History Social History   Tobacco Use   Smoking status: Former    Current packs/day: 0.00    Types: Cigarettes    Quit date: 02/04/2016    Years since quitting: 7.0   Smokeless tobacco: Never  Vaping Use   Vaping status: Former  Substance Use Topics   Alcohol use: Yes    Comment: socially   Drug use: Yes    Types: Marijuana     Allergies   Patient has no known allergies.   Review of Systems Review of Systems  Gastrointestinal:  Positive for abdominal pain.     Physical Exam Triage Vital Signs ED Triage Vitals  Encounter Vitals Group     BP 03/03/23 1955 119/86     Systolic  BP Percentile --      Diastolic BP Percentile --      Pulse Rate 03/03/23 1955 68     Resp 03/03/23 1955 14     Temp 03/03/23 1955 98.9 F (37.2 C)     Temp Source 03/03/23 1955 Oral     SpO2 03/03/23 1955 97 %     Weight --      Height --      Head Circumference --      Peak Flow --      Pain Score 03/03/23 1957 1     Pain Loc --      Pain Education --      Exclude from Growth Chart --    No data found.  Updated Vital Signs BP 119/86 (BP Location: Left Arm)   Pulse 68   Temp 98.9 F (37.2 C) (Oral)   Resp 14   LMP 07/15/2022   SpO2 97%   Visual Acuity Right Eye Distance:   Left Eye Distance:   Bilateral Distance:    Right Eye Near:   Left Eye Near:    Bilateral Near:     Physical Exam Vitals reviewed.  Constitutional:      General: She is not in acute distress.    Appearance: She is not toxic-appearing.  Cardiovascular:     Rate and Rhythm: Normal rate and regular rhythm.  Pulmonary:     Effort: Pulmonary effort is normal.     Breath sounds: Normal  breath sounds.  Abdominal:     Palpations: Abdomen is soft.     Tenderness: There is no abdominal tenderness.  Skin:    Coloration: Skin is not jaundiced or pale.  Neurological:     General: No focal deficit present.     Mental Status: She is alert and oriented to person, place, and time.  Psychiatric:        Behavior: Behavior normal.      UC Treatments / Results  Labs (all labs ordered are listed, but only abnormal results are displayed) Labs Reviewed  CERVICOVAGINAL ANCILLARY ONLY    EKG   Radiology No results found.  Procedures Procedures (including critical care time)  Medications Ordered in UC Medications - No data to display  Initial Impression / Assessment and Plan / UC Course  I have reviewed the triage vital signs and the nursing notes.  Pertinent labs & imaging results that were available during my care of the patient were reviewed by me and considered in my medical decision making (see chart for details).        Vaginal self swab is done tonight.  We will let her know any positives and treat per protocol.  Flagyl was sent to empirically treat possible BV or recurrence of trichomonas. Final Clinical Impressions(s) / UC Diagnoses   Final diagnoses:  Pelvic pain in female     Discharge Instructions      Take metronidazole 500 mg--1 tablet 2 times daily for 7 days.  Avoid drinking alcohol within 72 hours of taking this medication  Staff will notify you if there is anything positive on your swab       ED Prescriptions     Medication Sig Dispense Auth. Provider   metroNIDAZOLE (FLAGYL) 500 MG tablet Take 1 tablet (500 mg total) by mouth 2 (two) times daily for 7 days. 14 tablet Hilery Wintle, Janace Aris, MD      PDMP not reviewed this encounter.   Zenia Resides, MD 03/03/23  2022  

## 2023-03-03 NOTE — Discharge Instructions (Addendum)
Take metronidazole 500 mg--1 tablet 2 times daily for 7 days.  Avoid drinking alcohol within 72 hours of taking this medication  Staff will notify you if there is anything positive on your swab

## 2023-03-03 NOTE — ED Triage Notes (Signed)
Patient c/o lower abdominal pain and states that she has frequent BV. Patient states she was prescribed treatment for trich and she thinks her sexual partner lied about getting treated and she thinks she may have trich again.

## 2023-03-05 LAB — CERVICOVAGINAL ANCILLARY ONLY
Bacterial Vaginitis (gardnerella): POSITIVE — AB
Candida Glabrata: NEGATIVE
Candida Vaginitis: NEGATIVE
Chlamydia: NEGATIVE
Comment: NEGATIVE
Comment: NEGATIVE
Comment: NEGATIVE
Comment: NEGATIVE
Comment: NEGATIVE
Comment: NORMAL
Neisseria Gonorrhea: NEGATIVE
Trichomonas: NEGATIVE

## 2023-03-13 DIAGNOSIS — Z419 Encounter for procedure for purposes other than remedying health state, unspecified: Secondary | ICD-10-CM | POA: Diagnosis not present

## 2023-04-13 DIAGNOSIS — Z419 Encounter for procedure for purposes other than remedying health state, unspecified: Secondary | ICD-10-CM | POA: Diagnosis not present

## 2023-05-02 ENCOUNTER — Ambulatory Visit (HOSPITAL_COMMUNITY)
Admission: EM | Admit: 2023-05-02 | Discharge: 2023-05-02 | Disposition: A | Discharge status: Home / Self Care | Payer: Commercial Managed Care - HMO | Attending: Physician Assistant | Admitting: Physician Assistant

## 2023-05-02 ENCOUNTER — Encounter (HOSPITAL_COMMUNITY): Payer: Self-pay

## 2023-05-02 DIAGNOSIS — Z113 Encounter for screening for infections with a predominantly sexual mode of transmission: Secondary | ICD-10-CM | POA: Diagnosis not present

## 2023-05-02 DIAGNOSIS — N898 Other specified noninflammatory disorders of vagina: Secondary | ICD-10-CM | POA: Diagnosis not present

## 2023-05-02 LAB — RPR: RPR Ser Ql: NONREACTIVE

## 2023-05-02 LAB — HIV ANTIBODY (ROUTINE TESTING W REFLEX): HIV Screen 4th Generation wRfx: NONREACTIVE

## 2023-05-02 MED ORDER — METRONIDAZOLE 500 MG PO TABS: 500.0000 mg | ORAL_TABLET | Freq: Two times a day (BID) | ORAL | 0 refills | Status: DC

## 2023-05-02 NOTE — Discharge Instructions (Signed)
We are treating you for bacterial vaginosis.  Take metronidazole twice daily for 7 days.  Do not drink any alcohol while on this medication for 3 days after completing course as it will cause you to vomit.  We will contact you if any of the testing is positive and we need to arrange treatment.  Wear loosefitting cotton underwear and use hypoallergenic soaps and detergents.  Abstain from sex and to receive results.  If you have any worsening symptoms including abdominal pain, pelvic pain, fever, nausea, vomiting you need to be seen immediately.  Use a condom with each sexual encounter.

## 2023-05-02 NOTE — ED Provider Notes (Signed)
[facility]    CSN: [id number] Arrival date & time: 05/02/23  1025      History   Chief Complaint Chief Complaint  Patient presents with   SEXUALLY TRANSMITTED DISEASE   Vaginal Discharge    HPI Angela Cortez is a 32 y.o. female.   Patient presents today with a 1 week history of vaginal discharge.  She reports that it is copious and malodorous.  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  She did change her soap before her symptoms began and believes this triggered her symptoms.  She is sexually active but has not been so in the past few months.  She is interested in complete STI panel just to make sure that STIs are not contributing to her symptoms.  She has no concern for pregnancy.  She denies any recent antibiotics.  She denies history of diabetes or SGLT2 inhibitor.  She has not tried any over-the-counter medication for symptom management.    Past Medical History:  Diagnosis Date   Medical history non-contributory     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   ASCUS of cervix with negative high risk HPV 05/31/2020   Obesity in pregnancy 05/28/2020   Mild tetrahydrocannabinol (THC) abuse 10/15/2016    Past Surgical History:  Procedure Laterality Date   ACROMIO-CLAVICULAR JOINT REPAIR     CESAREAN SECTION N/A 12/17/2020   Procedure: CESAREAN SECTION;  Surgeon: Tereso Newcomer, MD;  Location: [facility];  Service: Obstetrics;  Laterality: N/A;  Nonreassuring FHR Tracing Arrest of Dilation    OB History     Gravida  6   Para  3   Term  3   Preterm      AB  2   Living  2      SAB      IAB  2   Ectopic      Multiple  0   Live Births  2            Home Medications    Prior to Admission medications   Medication Sig Start Date End Date Taking? Authorizing Provider  metroNIDAZOLE (FLAGYL) 500 MG tablet Take 1 tablet (500 mg total)  by mouth 2 (two) times daily. 05/02/23  Yes Breiona Couvillon, Noberto Retort, PA-C    Family History Family History  Problem Relation Age of Onset   Diabetes Father    Hypertension Father    Cancer Paternal Uncle    Cancer Paternal Grandmother    Asthma Mother     Social History Social History   Tobacco Use   Smoking status: Former    Current packs/day: 0.00    Types: Cigarettes    Quit date: 02/04/2016    Years since quitting: 7.2   Smokeless tobacco: Never  Vaping Use   Vaping status: Former  Substance Use Topics   Alcohol use: Yes    Comment: socially   Drug use: Yes    Types: Marijuana     Allergies   Patient has no known allergies.   Review of Systems Review of Systems  Constitutional:  Positive for activity change. Negative for appetite change, fatigue and fever.  Gastrointestinal:  Negative for abdominal pain, diarrhea, nausea and vomiting.  Genitourinary:  Positive for vaginal discharge. Negative for dysuria, frequency, pelvic pain, urgency, vaginal bleeding and vaginal pain.     Physical Exam Triage Vital Signs  ED Triage Vitals [05/02/23 1123]  Encounter Vitals Group     BP 111/75     Systolic BP Percentile      Diastolic BP Percentile      Pulse Rate 99     Resp 18     Temp 98.2 F (36.8 C)     Temp Source Oral     SpO2 98 %     Weight 170 lb (77.1 kg)     Height 5\' 3"  (1.6 m)     Head Circumference      Peak Flow      Pain Score 0     Pain Loc      Pain Education      Exclude from Growth Chart    No data found.  Updated Vital Signs BP 111/75 (BP Location: Right Arm)   Pulse 99   Temp 98.2 F (36.8 C) (Oral)   Resp 18   Ht 5\' 3"  (1.6 m)   Wt 170 lb (77.1 kg)   LMP  (LMP Unknown)   SpO2 98%   BMI 30.11 kg/m   Visual Acuity Right Eye Distance:   Left Eye Distance:   Bilateral Distance:    Right Eye Near:   Left Eye Near:    Bilateral Near:     Physical Exam Vitals reviewed.  Constitutional:      General: She is awake. She is not in  acute distress.    Appearance: Normal appearance. She is well-developed. She is not ill-appearing.     Comments: Very pleasant female appears stated age in no acute distress sitting comfortably in exam room  HENT:     Head: Normocephalic and atraumatic.  Cardiovascular:     Rate and Rhythm: Normal rate and regular rhythm.     Heart sounds: Normal heart sounds, S1 normal and S2 normal. No murmur heard. Pulmonary:     Effort: Pulmonary effort is normal.     Breath sounds: Normal breath sounds. No wheezing, rhonchi or rales.     Comments: Clear to auscultation bilaterally Abdominal:     General: Bowel sounds are normal.     Palpations: Abdomen is soft.     Tenderness: There is no abdominal tenderness. There is no right CVA tenderness, left CVA tenderness, guarding or rebound.     Comments: Benign abdominal exam  Psychiatric:        Behavior: Behavior is cooperative.      UC Treatments / Results  Labs (all labs ordered are listed, but only abnormal results are displayed) Labs Reviewed  RPR  HIV ANTIBODY (ROUTINE TESTING W REFLEX)  CERVICOVAGINAL ANCILLARY ONLY    EKG   Radiology No results found.  Procedures Procedures (including critical care time)  Medications Ordered in UC Medications - No data to display  Initial Impression / Assessment and Plan / UC Course  I have reviewed the triage vital signs and the nursing notes.  Pertinent labs & imaging results that were available during my care of the patient were reviewed by me and considered in my medical decision making (see chart for details).     Patient is well-appearing, afebrile, nontoxic, nontachycardic.  Vital signs and physical exam are reassuring with no indication for emergent evaluation or imaging.  STI swab was collected and is pending.  Given her clinical presentation will treat for bacterial vaginosis.  Metronidazole sent to pharmacy.  We discussed that she should avoid alcohol while on this medication for  3 days after completing course.  We will contact her if any of her other testing is positive and we need to arrange additional treatment.  Discussed the importance of safe sex practices.  If she develops any pelvic pain, abdominal pain, fever, nausea, vomiting she needs to be seen immediately.  Strict return precautions given.   Final Clinical Impressions(s) / UC Diagnoses   Final diagnoses:  Vaginal discharge  Screening examination for STI     Discharge Instructions      We are treating you for bacterial vaginosis.  Take metronidazole twice daily for 7 days.  Do not drink any alcohol while on this medication for 3 days after completing course as it will cause you to vomit.  We will contact you if any of the testing is positive and we need to arrange treatment.  Wear loosefitting cotton underwear and use hypoallergenic soaps and detergents.  Abstain from sex and to receive results.  If you have any worsening symptoms including abdominal pain, pelvic pain, fever, nausea, vomiting you need to be seen immediately.  Use a condom with each sexual encounter.    ED Prescriptions     Medication Sig Dispense Auth. Provider   metroNIDAZOLE (FLAGYL) 500 MG tablet Take 1 tablet (500 mg total) by mouth 2 (two) times daily. 14 tablet Jyron Turman, Noberto Retort, PA-C      PDMP not reviewed this encounter.   Jeani Hawking, PA-C 05/02/23 1159

## 2023-05-02 NOTE — ED Triage Notes (Addendum)
Patient thinks she has BV but wanting full panel STD testing and blood work. Has re-occurring BV. Onset of current symptoms 1 week.   Not currently sexually active. No new partners for the last 2-3 months.

## 2023-05-05 LAB — CERVICOVAGINAL ANCILLARY ONLY
Bacterial Vaginitis (gardnerella): POSITIVE — AB
Candida Glabrata: NEGATIVE
Candida Vaginitis: NEGATIVE
Chlamydia: NEGATIVE
Comment: NEGATIVE
Comment: NEGATIVE
Comment: NEGATIVE
Comment: NEGATIVE
Comment: NEGATIVE
Comment: NORMAL
Neisseria Gonorrhea: NEGATIVE
Trichomonas: NEGATIVE

## 2023-05-13 DIAGNOSIS — Z419 Encounter for procedure for purposes other than remedying health state, unspecified: Secondary | ICD-10-CM | POA: Diagnosis not present

## 2023-06-11 ENCOUNTER — Ambulatory Visit (HOSPITAL_COMMUNITY): Payer: Commercial Managed Care - HMO

## 2023-06-13 DIAGNOSIS — Z419 Encounter for procedure for purposes other than remedying health state, unspecified: Secondary | ICD-10-CM | POA: Diagnosis not present

## 2023-07-13 DIAGNOSIS — Z419 Encounter for procedure for purposes other than remedying health state, unspecified: Secondary | ICD-10-CM | POA: Diagnosis not present

## 2023-07-18 ENCOUNTER — Encounter (HOSPITAL_COMMUNITY): Payer: Self-pay | Admitting: Emergency Medicine

## 2023-07-18 ENCOUNTER — Ambulatory Visit (HOSPITAL_COMMUNITY)
Admission: EM | Admit: 2023-07-18 | Discharge: 2023-07-18 | Disposition: A | Discharge status: Home / Self Care | Payer: Commercial Managed Care - HMO | Attending: Family Medicine | Admitting: Family Medicine

## 2023-07-18 DIAGNOSIS — N760 Acute vaginitis: Secondary | ICD-10-CM | POA: Diagnosis not present

## 2023-07-18 MED ORDER — METRONIDAZOLE 0.75 % VA GEL: 1.0000 | Freq: Every day | VAGINAL | 0 refills | Status: AC

## 2023-07-18 NOTE — Discharge Instructions (Signed)
You were seen today for vaginal discharge.  I will treat you for BV today.  I have sent out the vaginal gel for treatment.  Your swab will be resulted tomorrow and you will be notified of anything else that needs treatment.

## 2023-07-18 NOTE — ED Triage Notes (Signed)
Pt c/o vaginal odor and discharge for 1 week.

## 2023-07-18 NOTE — ED Provider Notes (Signed)
[facility]    CSN: [id number] Arrival date & time: 07/18/23  1419      History   Chief Complaint Chief Complaint  Patient presents with   Vaginitis    HPI Angela Cortez is a 32 y.o. female.   Patient is here for vaginal d/c and irritation for about a week.  She has h/o BV and this is similar.  She did change her soap and feels that is the cause. She has not been sexually active recently.  No known exposures.  No fevers/chills.  No urinary symptoms.  She prefers the vaginal insert for treatment.   Past Medical History:  Diagnosis Date   Medical history non-contributory     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   ASCUS of cervix with negative high risk HPV 05/31/2020   Obesity in pregnancy 05/28/2020   Mild tetrahydrocannabinol (THC) abuse 10/15/2016    Past Surgical History:  Procedure Laterality Date   ACROMIO-CLAVICULAR JOINT REPAIR     CESAREAN SECTION N/A 12/17/2020   Procedure: CESAREAN SECTION;  Surgeon: Tereso Newcomer, MD;  Location: [facility];  Service: Obstetrics;  Laterality: N/A;  Nonreassuring FHR Tracing Arrest of Dilation    OB History     Gravida  6   Para  3   Term  3   Preterm      AB  2   Living  2      SAB      IAB  2   Ectopic      Multiple  0   Live Births  2            Home Medications    Prior to Admission medications   Medication Sig Start Date End Date Taking? Authorizing Provider  metroNIDAZOLE (FLAGYL) 500 MG tablet Take 1 tablet (500 mg total) by mouth 2 (two) times daily. Patient not taking: Reported on 07/18/2023 05/02/23   Raspet, Noberto Retort, PA-C    Family History Family History  Problem Relation Age of Onset   Diabetes Father    Hypertension Father    Cancer Paternal Uncle    Cancer Paternal Grandmother    Asthma Mother     Social History Social History   Tobacco Use   Smoking status: Former    Current packs/day: 0.00    Types: Cigarettes     Quit date: 02/04/2016    Years since quitting: 7.4   Smokeless tobacco: Never  Vaping Use   Vaping status: Former  Substance Use Topics   Alcohol use: Yes    Comment: socially   Drug use: Yes    Types: Marijuana     Allergies   Patient has no known allergies.   Review of Systems Review of Systems  Constitutional: Negative.   HENT: Negative.    Cardiovascular: Negative.   Gastrointestinal: Negative.   Genitourinary:  Positive for vaginal discharge.     Physical Exam Triage Vital Signs ED Triage Vitals [07/18/23 1452]  Encounter Vitals Group     BP 124/81     Systolic BP Percentile      Diastolic BP Percentile      Pulse      Resp 16     Temp 98 F (36.7 C)     Temp Source Oral     SpO2      Weight      Height      Head Circumference  Peak Flow      Pain Score 0     Pain Loc      Pain Education      Exclude from Growth Chart    No data found.  Updated Vital Signs BP 124/81 (BP Location: Left Arm)   Temp 98 F (36.7 C) (Oral)   Resp 16   Visual Acuity Right Eye Distance:   Left Eye Distance:   Bilateral Distance:    Right Eye Near:   Left Eye Near:    Bilateral Near:     Physical Exam Constitutional:      Appearance: Normal appearance.  Cardiovascular:     Rate and Rhythm: Normal rate and regular rhythm.  Pulmonary:     Effort: Pulmonary effort is normal.     Breath sounds: Normal breath sounds.  Neurological:     General: No focal deficit present.     Mental Status: She is alert.  Psychiatric:        Mood and Affect: Mood normal.      UC Treatments / Results  Labs (all labs ordered are listed, but only abnormal results are displayed) Labs Reviewed  CERVICOVAGINAL ANCILLARY ONLY    EKG   Radiology No results found.  Procedures Procedures (including critical care time)  Medications Ordered in UC Medications - No data to display  Initial Impression / Assessment and Plan / UC Course  I have reviewed the triage  vital signs and the nursing notes.  Pertinent labs & imaging results that were available during my care of the patient were reviewed by me and considered in my medical decision making (see chart for details).   Final Clinical Impressions(s) / UC Diagnoses   Final diagnoses:  Vaginitis and vulvovaginitis     Discharge Instructions      You were seen today for vaginal discharge.  I will treat you for BV today.  I have sent out the vaginal gel for treatment.  Your swab will be resulted tomorrow and you will be notified of anything else that needs treatment.     ED Prescriptions     Medication Sig Dispense Auth. Provider   metroNIDAZOLE (METROGEL) 0.75 % vaginal gel Place 1 Applicatorful vaginally at bedtime for 5 days. 50 g Jannifer Franklin, MD      PDMP not reviewed this encounter.   Jannifer Franklin, MD 07/18/23 1505

## 2023-07-21 LAB — CERVICOVAGINAL ANCILLARY ONLY
Bacterial Vaginitis (gardnerella): POSITIVE — AB
Candida Glabrata: NEGATIVE
Candida Vaginitis: NEGATIVE
Chlamydia: NEGATIVE
Comment: NEGATIVE
Comment: NEGATIVE
Comment: NEGATIVE
Comment: NEGATIVE
Comment: NEGATIVE
Comment: NORMAL
Neisseria Gonorrhea: NEGATIVE
Trichomonas: NEGATIVE

## 2023-07-24 ENCOUNTER — Telehealth (HOSPITAL_COMMUNITY): Payer: Self-pay | Admitting: *Deleted

## 2023-07-24 MED ORDER — FLUCONAZOLE 150 MG PO TABS
150.0000 mg | ORAL_TABLET | ORAL | 0 refills | Status: AC
Start: 2023-07-24 — End: 2023-07-28

## 2023-07-24 NOTE — Telephone Encounter (Signed)
I have sent fluconazole in case this is a yeast infection. But, it is possible this is a reaction to the medication. If not improving in about 3 days of starting the fluconazole, needs to be seen again

## 2023-07-24 NOTE — Telephone Encounter (Signed)
Pt aware and verbalized understanding.  

## 2023-07-24 NOTE — Telephone Encounter (Addendum)
She states she started the Metrogel, she is now having vaginal itching and increased vaginal discharge which is thicker than before. She would like to know if she can have diflucan called in or does she need to be seen again. Advised her I would send message ot provider and call her back

## 2023-07-24 NOTE — Telephone Encounter (Signed)
LMOM

## 2023-08-13 DIAGNOSIS — Z419 Encounter for procedure for purposes other than remedying health state, unspecified: Secondary | ICD-10-CM | POA: Diagnosis not present

## 2023-09-13 DIAGNOSIS — Z419 Encounter for procedure for purposes other than remedying health state, unspecified: Secondary | ICD-10-CM | POA: Diagnosis not present

## 2023-09-16 ENCOUNTER — Encounter (HOSPITAL_COMMUNITY): Payer: Self-pay | Admitting: *Deleted

## 2023-09-16 ENCOUNTER — Ambulatory Visit (HOSPITAL_COMMUNITY)
Admission: EM | Admit: 2023-09-16 | Discharge: 2023-09-16 | Disposition: A | Discharge status: Home / Self Care | Payer: Medicaid Other | Attending: Family Medicine | Admitting: Family Medicine

## 2023-09-16 DIAGNOSIS — N898 Other specified noninflammatory disorders of vagina: Secondary | ICD-10-CM | POA: Diagnosis not present

## 2023-09-16 DIAGNOSIS — N926 Irregular menstruation, unspecified: Secondary | ICD-10-CM

## 2023-09-16 DIAGNOSIS — R1030 Lower abdominal pain, unspecified: Secondary | ICD-10-CM

## 2023-09-16 LAB — POCT URINALYSIS DIP (MANUAL ENTRY)
Bilirubin, UA: NEGATIVE
Glucose, UA: NEGATIVE mg/dL
Ketones, POC UA: NEGATIVE mg/dL
Leukocytes, UA: NEGATIVE
Nitrite, UA: NEGATIVE
Protein Ur, POC: NEGATIVE mg/dL
Spec Grav, UA: >=1.03 — AB (ref 1.010–1.025)
Urobilinogen, UA: 0.2 U/dL
pH, UA: 5.5 (ref 5.0–8.0)

## 2023-09-16 LAB — POCT URINE PREGNANCY: Preg Test, Ur: NEGATIVE

## 2023-09-16 MED ORDER — METRONIDAZOLE 500 MG PO TABS: 500.0000 mg | ORAL_TABLET | Freq: Two times a day (BID) | ORAL | 0 refills | Status: DC

## 2023-09-16 NOTE — Discharge Instructions (Signed)
 You were treated today for probable BV.  I have sent out flagyl  twice/day x 7 days.  Your swab will be resulted tomorrow and if there is anything else that needs to be treated you will be notified.  You pregnancy test was negative.  I recommend you monitor this, and make an appointment for your annual exam.  If you continue to have issues then discuss with your provider.

## 2023-09-16 NOTE — ED Provider Notes (Signed)
 [facility]    CSN: [id number] Arrival date & time: 09/16/23  9046      History   Chief Complaint Chief Complaint  Patient presents with   SEXUALLY TRANSMITTED DISEASE   Abdominal Pain    HPI Angela Cortez is a 33 y.o. female.    Abdominal Pain Associated symptoms: vaginal discharge    Patient is here for lower abdominal pain x several days.  Foul smelling vaginal discharge x 1 week.  No vaginal itching.  She just finished her period but having some spotting.  No known std exposures recently.  No fevers/chills.   No n/v.  No urinary symptoms.  No frequency, pain or burning.  No diarrhea or constipation.  She does have h/o frequent BV, and this seems similar.  She used to be on Depo, last shot was in June 2024.  She did have bleeding, and then normal periods for several months.       Past Medical History:  Diagnosis Date   Medical history non-contributory     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   ASCUS of cervix with negative high risk HPV 05/31/2020   Obesity in pregnancy 05/28/2020   Mild tetrahydrocannabinol (THC) abuse 10/15/2016    Past Surgical History:  Procedure Laterality Date   ACROMIO-CLAVICULAR JOINT REPAIR     CESAREAN SECTION N/A 12/17/2020   Procedure: CESAREAN SECTION;  Surgeon: Herchel Gloris LABOR, MD;  Location: [facility];  Service: Obstetrics;  Laterality: N/A;  Nonreassuring FHR Tracing Arrest of Dilation    OB History     Gravida  6   Para  3   Term  3   Preterm      AB  2   Living  2      SAB      IAB  2   Ectopic      Multiple  0   Live Births  2            Home Medications    Prior to Admission medications   Not on File    Family History Family History  Problem Relation Age of Onset   Diabetes Father    Hypertension Father    Cancer Paternal Uncle    Cancer Paternal Grandmother    Asthma Mother     Social History Social History   Tobacco  Use   Smoking status: Former    Current packs/day: 0.00    Types: Cigarettes    Quit date: 02/04/2016    Years since quitting: 7.6   Smokeless tobacco: Never  Vaping Use   Vaping status: Former  Substance Use Topics   Alcohol use: Yes    Comment: socially   Drug use: Yes    Types: Marijuana     Allergies   Patient has no known allergies.   Review of Systems Review of Systems  Constitutional: Negative.   HENT: Negative.    Respiratory: Negative.    Cardiovascular: Negative.   Gastrointestinal:  Positive for abdominal pain.  Genitourinary:  Positive for vaginal discharge.  Musculoskeletal: Negative.   Psychiatric/Behavioral: Negative.       Physical Exam Triage Vital Signs ED Triage Vitals  Encounter Vitals Group     BP 09/16/23 1129 122/80     Systolic BP Percentile --      Diastolic BP Percentile --      Pulse Rate 09/16/23 1129 74     Resp 09/16/23  1129 18     Temp 09/16/23 1129 98.3 F (36.8 C)     Temp Source 09/16/23 1129 Oral     SpO2 09/16/23 1129 100 %     Weight --      Height --      Head Circumference --      Peak Flow --      Pain Score 09/16/23 1128 3     Pain Loc --      Pain Education --      Exclude from Growth Chart --    No data found.  Updated Vital Signs BP 122/80 (BP Location: Left Arm)   Pulse 74   Temp 98.3 F (36.8 C) (Oral)   Resp 18   LMP 09/06/2023 (Exact Date)   SpO2 100%   Visual Acuity Right Eye Distance:   Left Eye Distance:   Bilateral Distance:    Right Eye Near:   Left Eye Near:    Bilateral Near:     Physical Exam Constitutional:      General: She is not in acute distress.    Appearance: She is well-developed and normal weight. She is not ill-appearing or toxic-appearing.  Cardiovascular:     Rate and Rhythm: Normal rate and regular rhythm.  Pulmonary:     Effort: Pulmonary effort is normal.     Breath sounds: Normal breath sounds.  Abdominal:     General: Bowel sounds are normal.     Palpations:  Abdomen is soft.     Tenderness: There is abdominal tenderness in the suprapubic area. There is no right CVA tenderness, left CVA tenderness, guarding or rebound.  Skin:    General: Skin is warm.  Neurological:     General: No focal deficit present.     Mental Status: She is alert.  Psychiatric:        Mood and Affect: Mood normal.      UC Treatments / Results  Labs (all labs ordered are listed, but only abnormal results are displayed) Labs Reviewed  POCT URINALYSIS DIP (MANUAL ENTRY) - Abnormal; Notable for the following components:      Result Value   Color, UA straw (*)    Spec Grav, UA >=1.030 (*)    Blood, UA large (*)    All other components within normal limits  POCT URINE PREGNANCY  CERVICOVAGINAL ANCILLARY ONLY   UPT negative  EKG   Radiology No results found.  Procedures Procedures (including critical care time)  Medications Ordered in UC Medications - No data to display  Initial Impression / Assessment and Plan / UC Course  I have reviewed the triage vital signs and the nursing notes.  Pertinent labs & imaging results that were available during my care of the patient were reviewed by me and considered in my medical decision making (see chart for details).    Final Clinical Impressions(s) / UC Diagnoses   Final diagnoses:  Lower abdominal pain  Abnormal menstrual periods  Vaginal discharge     Discharge Instructions      You were treated today for probable BV.  I have sent out flagyl  twice/day x 7 days.  Your swab will be resulted tomorrow and if there is anything else that needs to be treated you will be notified.  You pregnancy test was negative.  I recommend you monitor this, and make an appointment for your annual exam.  If you continue to have issues then discuss with your provider.  ED Prescriptions     Medication Sig Dispense Auth. Provider   metroNIDAZOLE  (FLAGYL ) 500 MG tablet Take 1 tablet (500 mg total) by mouth 2 (two) times  daily. 14 tablet Darral Longs, MD      PDMP not reviewed this encounter.   Darral Longs, MD 09/16/23 [phone]

## 2023-09-16 NOTE — ED Triage Notes (Signed)
Pt states she has lower abdominal pain X 3 days. She also had foul smelling vaginal discharge X  1 week. She states she just finished her cycle and now she is spotting again

## 2023-09-17 LAB — CERVICOVAGINAL ANCILLARY ONLY
Bacterial Vaginitis (gardnerella): POSITIVE — AB
Candida Glabrata: NEGATIVE
Candida Vaginitis: NEGATIVE
Chlamydia: NEGATIVE
Comment: NEGATIVE
Comment: NEGATIVE
Comment: NEGATIVE
Comment: NEGATIVE
Comment: NEGATIVE
Comment: NORMAL
Neisseria Gonorrhea: NEGATIVE
Trichomonas: NEGATIVE

## 2023-10-11 DIAGNOSIS — Z419 Encounter for procedure for purposes other than remedying health state, unspecified: Secondary | ICD-10-CM | POA: Diagnosis not present

## 2023-10-31 ENCOUNTER — Encounter (HOSPITAL_COMMUNITY): Payer: Self-pay | Admitting: *Deleted

## 2023-10-31 ENCOUNTER — Ambulatory Visit (HOSPITAL_COMMUNITY)
Admission: EM | Admit: 2023-10-31 | Discharge: 2023-10-31 | Disposition: A | Discharge status: Home / Self Care | Attending: Family Medicine | Admitting: Family Medicine

## 2023-10-31 DIAGNOSIS — R102 Pelvic and perineal pain: Secondary | ICD-10-CM | POA: Insufficient documentation

## 2023-10-31 DIAGNOSIS — N760 Acute vaginitis: Secondary | ICD-10-CM | POA: Insufficient documentation

## 2023-10-31 MED ORDER — LIDOCAINE HCL (PF) 1 % IJ SOLN
INTRAMUSCULAR | Status: AC
  Filled 2023-10-31: qty 2

## 2023-10-31 MED ORDER — DOXYCYCLINE HYCLATE 100 MG PO CAPS: 100.0000 mg | ORAL_CAPSULE | Freq: Two times a day (BID) | ORAL | 0 refills | Status: AC

## 2023-10-31 MED ORDER — CEFTRIAXONE SODIUM 500 MG IJ SOLR
500.0000 mg | INTRAMUSCULAR | Status: DC
  Administered 2023-10-31: 500 mg via INTRAMUSCULAR

## 2023-10-31 MED ORDER — METRONIDAZOLE 500 MG PO TABS
500.0000 mg | ORAL_TABLET | Freq: Two times a day (BID) | ORAL | 0 refills | Status: AC
Start: 2023-10-31 — End: 2023-11-07

## 2023-10-31 MED ORDER — FLUCONAZOLE 100 MG PO TABS: ORAL_TABLET | ORAL | 0 refills | Status: DC

## 2023-10-31 MED ORDER — CEFTRIAXONE SODIUM 500 MG IJ SOLR
INTRAMUSCULAR | Status: AC
  Filled 2023-10-31: qty 500

## 2023-10-31 NOTE — Discharge Instructions (Signed)
 Staff will notify you if there is anything positive on the swab.  You have been given a shot of ceftriaxone 500 mg; this is for potential gonorrhea  Take metronidazole 500 mg--1 tablet 2 times daily for 7 days.  Avoid drinking alcohol within 72 hours of taking this medication.  This medication treats BV and  Take doxycycline 100 mg --1 capsule 2 times daily for 7 days; this is to treat potential chlamydia.  Fluconazole 100 mg--2 tablets by mouth the first day then 1 tablet by mouth daily for 6 more days.  This is a more prolonged treatment for yeast.

## 2023-10-31 NOTE — ED Triage Notes (Signed)
 Pt states that she started having vaginal odor a couple days ago, she states she has recurrent BV. She states Wednesday she started with cottage cheese like discharge and itchy. She used fluconazole once. She is also having a little abdominal pain.    She complain of left hip pain that comes and goes but radiates down her leg when it comes.

## 2023-10-31 NOTE — ED Provider Notes (Signed)
 [facility]    CSN: [id number] Arrival date & time: 10/31/23  0818      History   Chief Complaint Chief Complaint  Patient presents with   Abdominal Pain   Vaginal Discharge   Vaginal Itching   Hip Pain    HPI Angela Cortez is a 33 y.o. female.    Abdominal Pain Associated symptoms: vaginal discharge   Vaginal Discharge Associated symptoms: abdominal pain and vaginal itching   Vaginal Itching Associated symptoms include abdominal pain.  Hip Pain Associated symptoms include abdominal pain.  Here for vaginal irritation that began about 3/17. Then 3 days ago she noted a thick vaginal dc like cottage cheese. Has taken 1 diflucan, and no improvement. Having lots of itching, and now her perineum burn when urine hits it, since she has been scratching. She also had some lower abdominal pain, a mild ache. No dysuria or frequency.  LMP 10/12/23.  She states at first this was like her recurrent BV, but she is now very concerned that she has an STD, and requests empiric treatment today.  Past Medical History:  Diagnosis Date   Medical history non-contributory     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   ASCUS of cervix with negative high risk HPV 05/31/2020   Obesity in pregnancy 05/28/2020   Mild tetrahydrocannabinol (THC) abuse 10/15/2016    Past Surgical History:  Procedure Laterality Date   ACROMIO-CLAVICULAR JOINT REPAIR     CESAREAN SECTION N/A 12/17/2020   Procedure: CESAREAN SECTION;  Surgeon: Tereso Newcomer, MD;  Location: [facility];  Service: Obstetrics;  Laterality: N/A;  Nonreassuring FHR Tracing Arrest of Dilation    OB History     Gravida  6   Para  3   Term  3   Preterm      AB  2   Living  2      SAB      IAB  2   Ectopic      Multiple  0   Live Births  2            Home Medications    Prior to Admission medications   Medication Sig Start Date End Date Taking? Authorizing  Provider  doxycycline (VIBRAMYCIN) 100 MG capsule Take 1 capsule (100 mg total) by mouth 2 (two) times daily for 7 days. 10/31/23 11/07/23 Yes Zenia Resides, MD  fluconazole (DIFLUCAN) 100 MG tablet 2 tablets by mouth the first day, then 1 tablet daily for 6 more days. 10/31/23  Yes Zenia Resides, MD  metroNIDAZOLE (FLAGYL) 500 MG tablet Take 1 tablet (500 mg total) by mouth 2 (two) times daily for 7 days. 10/31/23 11/07/23 Yes Tahjanae Blankenburg, Janace Aris, MD    Family History Family History  Problem Relation Age of Onset   Diabetes Father    Hypertension Father    Cancer Paternal Uncle    Cancer Paternal Grandmother    Asthma Mother     Social History Social History   Tobacco Use   Smoking status: Former    Current packs/day: 0.00    Types: Cigarettes    Quit date: 02/04/2016    Years since quitting: 7.7   Smokeless tobacco: Never  Vaping Use   Vaping status: Former  Substance Use Topics   Alcohol use: Yes    Comment: socially   Drug use: Yes    Types: Marijuana     Allergies  Patient has no known allergies.   Review of Systems Review of Systems  Gastrointestinal:  Positive for abdominal pain.  Genitourinary:  Positive for vaginal discharge.     Physical Exam Triage Vital Signs ED Triage Vitals  Encounter Vitals Group     BP 10/31/23 0849 119/80     Systolic BP Percentile --      Diastolic BP Percentile --      Pulse Rate 10/31/23 0849 81     Resp 10/31/23 0849 16     Temp 10/31/23 0849 98.2 F (36.8 C)     Temp Source 10/31/23 0849 Oral     SpO2 10/31/23 0849 100 %     Weight --      Height --      Head Circumference --      Peak Flow --      Pain Score 10/31/23 0848 3     Pain Loc --      Pain Education --      Exclude from Growth Chart --    No data found.  Updated Vital Signs BP 119/80 (BP Location: Left Arm)   Pulse 81   Temp 98.2 F (36.8 C) (Oral)   Resp 16   LMP 10/12/2023 (Exact Date)   SpO2 100%   Visual Acuity Right Eye  Distance:   Left Eye Distance:   Bilateral Distance:    Right Eye Near:   Left Eye Near:    Bilateral Near:     Physical Exam Vitals reviewed.  Constitutional:      General: She is not in acute distress.    Appearance: She is not ill-appearing, toxic-appearing or diaphoretic.  HENT:     Mouth/Throat:     Mouth: Mucous membranes are moist.  Cardiovascular:     Rate and Rhythm: Normal rate and regular rhythm.     Heart sounds: No murmur heard. Pulmonary:     Effort: Pulmonary effort is normal.     Breath sounds: Normal breath sounds.  Abdominal:     General: There is no distension.     Palpations: Abdomen is soft.     Comments: There is some tenderness in LLQ. No mass or guarding or distention  Skin:    Coloration: Skin is not jaundiced or pale.  Neurological:     General: No focal deficit present.     Mental Status: She is alert and oriented to person, place, and time.  Psychiatric:        Behavior: Behavior normal.      UC Treatments / Results  Labs (all labs ordered are listed, but only abnormal results are displayed) Labs Reviewed  CERVICOVAGINAL ANCILLARY ONLY    EKG   Radiology No results found.  Procedures Procedures (including critical care time)  Medications Ordered in UC Medications  cefTRIAXone (ROCEPHIN) injection 500 mg (500 mg Intramuscular Given 10/31/23 0934)    Initial Impression / Assessment and Plan / UC Course  I have reviewed the triage vital signs and the nursing notes.  Pertinent labs & imaging results that were available during my care of the patient were reviewed by me and considered in my medical decision making (see chart for details).     Vaginal self swab is done, and we will notify of any positives on that and treat per protocol.  I feel that she at least has BV and yeast vaginitis, so metronidazole and a 1 week course of fluconazole are sent to the pharmacy.  With her  having some pelvic pain though it is mild, and with  her concern for STD, Rocephin is given for empiric treatment along with doxycycline which is sent to the pharmacy Final Clinical Impressions(s) / UC Diagnoses   Final diagnoses:  Acute vaginitis  Pelvic pain in female     Discharge Instructions      Staff will notify you if there is anything positive on the swab.  You have been given a shot of ceftriaxone 500 mg; this is for potential gonorrhea  Take metronidazole 500 mg--1 tablet 2 times daily for 7 days.  Avoid drinking alcohol within 72 hours of taking this medication.  This medication treats BV and  Take doxycycline 100 mg --1 capsule 2 times daily for 7 days; this is to treat potential chlamydia.  Fluconazole 100 mg--2 tablets by mouth the first day then 1 tablet by mouth daily for 6 more days.  This is a more prolonged treatment for yeast.     ED Prescriptions     Medication Sig Dispense Auth. Provider   doxycycline (VIBRAMYCIN) 100 MG capsule Take 1 capsule (100 mg total) by mouth 2 (two) times daily for 7 days. 14 capsule Zenia Resides, MD   fluconazole (DIFLUCAN) 100 MG tablet 2 tablets by mouth the first day, then 1 tablet daily for 6 more days. 8 tablet Annalise Mcdiarmid, Janace Aris, MD   metroNIDAZOLE (FLAGYL) 500 MG tablet Take 1 tablet (500 mg total) by mouth 2 (two) times daily for 7 days. 14 tablet Khan Chura, Janace Aris, MD      PDMP not reviewed this encounter.   Zenia Resides, MD 10/31/23 [id number]

## 2023-11-03 LAB — CERVICOVAGINAL ANCILLARY ONLY
Bacterial Vaginitis (gardnerella): POSITIVE — AB
Candida Glabrata: NEGATIVE
Candida Vaginitis: POSITIVE — AB
Chlamydia: NEGATIVE
Comment: NEGATIVE
Comment: NEGATIVE
Comment: NEGATIVE
Comment: NEGATIVE
Comment: NEGATIVE
Comment: NORMAL
Neisseria Gonorrhea: NEGATIVE
Trichomonas: NEGATIVE

## 2023-11-13 ENCOUNTER — Ambulatory Visit: Payer: Medicaid Other

## 2023-11-22 DIAGNOSIS — Z419 Encounter for procedure for purposes other than remedying health state, unspecified: Secondary | ICD-10-CM | POA: Diagnosis not present

## 2023-12-09 ENCOUNTER — Ambulatory Visit: Admitting: Obstetrics & Gynecology

## 2023-12-22 DIAGNOSIS — Z419 Encounter for procedure for purposes other than remedying health state, unspecified: Secondary | ICD-10-CM | POA: Diagnosis not present

## 2024-01-22 DIAGNOSIS — Z419 Encounter for procedure for purposes other than remedying health state, unspecified: Secondary | ICD-10-CM | POA: Diagnosis not present

## 2024-02-06 ENCOUNTER — Encounter (HOSPITAL_COMMUNITY): Payer: Self-pay | Admitting: *Deleted

## 2024-02-06 ENCOUNTER — Ambulatory Visit (HOSPITAL_COMMUNITY): Admission: EM | Admit: 2024-02-06 | Discharge: 2024-02-06 | Discharge status: Against Medical Advice

## 2024-02-06 NOTE — ED Notes (Signed)
 Pt not in room, restroom, or lobby, provider notified.

## 2024-02-06 NOTE — ED Triage Notes (Signed)
 Pt states that she has lower abdominal pain X 3 days. She has vaginal discharge since yesterday. She hasn't been taking any meds.

## 2024-02-21 DIAGNOSIS — Z419 Encounter for procedure for purposes other than remedying health state, unspecified: Secondary | ICD-10-CM | POA: Diagnosis not present

## 2024-02-25 ENCOUNTER — Ambulatory Visit: Admitting: Obstetrics and Gynecology

## 2024-02-25 ENCOUNTER — Other Ambulatory Visit (HOSPITAL_COMMUNITY)
Admission: RE | Admit: 2024-02-25 | Discharge: 2024-02-25 | Disposition: A | Discharge status: Home / Self Care | Source: Ambulatory Visit | Attending: Physician Assistant | Admitting: Physician Assistant

## 2024-02-25 ENCOUNTER — Encounter: Payer: Self-pay | Admitting: Obstetrics and Gynecology

## 2024-02-25 VITALS — BP 123/87 | HR 85 | Ht 63.0 in | Wt 200.0 lb

## 2024-02-25 DIAGNOSIS — Z01419 Encounter for gynecological examination (general) (routine) without abnormal findings: Secondary | ICD-10-CM | POA: Insufficient documentation

## 2024-02-25 DIAGNOSIS — F32A Depression, unspecified: Secondary | ICD-10-CM | POA: Diagnosis not present

## 2024-02-25 DIAGNOSIS — Z1331 Encounter for screening for depression: Secondary | ICD-10-CM | POA: Diagnosis not present

## 2024-02-25 DIAGNOSIS — N760 Acute vaginitis: Secondary | ICD-10-CM | POA: Diagnosis not present

## 2024-02-25 DIAGNOSIS — F419 Anxiety disorder, unspecified: Secondary | ICD-10-CM

## 2024-02-25 DIAGNOSIS — B9689 Other specified bacterial agents as the cause of diseases classified elsewhere: Secondary | ICD-10-CM | POA: Diagnosis not present

## 2024-02-25 DIAGNOSIS — Z319 Encounter for procreative management, unspecified: Secondary | ICD-10-CM

## 2024-02-25 LAB — POCT URINE PREGNANCY: Preg Test, Ur: NEGATIVE

## 2024-02-25 MED ORDER — METRONIDAZOLE 500 MG PO TABS: 500.0000 mg | ORAL_TABLET | Freq: Two times a day (BID) | ORAL | 0 refills | Status: AC

## 2024-02-25 MED ORDER — CLINDAMYCIN PHOSPHATE 2 % VA CREA: 1.0000 | TOPICAL_CREAM | Freq: Every day | VAGINAL | 0 refills | Status: DC

## 2024-02-25 MED ORDER — SERTRALINE HCL 50 MG PO TABS: 50.0000 mg | ORAL_TABLET | Freq: Every day | ORAL | 1 refills | Status: AC

## 2024-02-25 NOTE — Progress Notes (Signed)
 ANNUAL EXAM Patient name: Angela Cortez MRN [id number]  Date of birth: 03-19-1991 Chief Complaint:   Annual Exam  History of Present Illness:   Angela Cortez is a 33 y.o. [phone] African-American female being seen today for a routine annual exam.  Current complaints: Recurrent BV for past 2 years. Confirmed with vaginal swabs. Will often get symptoms during/after period. Has had same female partner throughout most of this time. No current symptoms.   Notes she has really been struggling with depression. She will have days where she can't get out of bed. 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 Denies SI. No new physical symptoms expect the associated fatigue. Does not have a hx of MDD or manic symptoms.  Patient's last menstrual period was 02/02/2024.   Desires pregnancy.  Last pap 10/21. Results were: ASCUS w/ HRHPV negative. H/O abnormal pap: none other than '21     02/25/2024    1:18 PM  Depression screen PHQ 2/9  Decreased Interest 0  Down, Depressed, Hopeless 2  PHQ - 2 Score 2  Altered sleeping 3  Tired, decreased energy 2  Change in appetite 0  Feeling bad or failure about yourself  1  Trouble concentrating 0  Moving slowly or fidgety/restless 0  Suicidal thoughts 0  PHQ-9 Score 8        02/25/2024    1:20 PM 05/25/2020    2:04 PM  GAD 7 : Generalized Anxiety Score  Nervous, Anxious, on Edge 1 0  Control/stop worrying 3 0  Worry too much - different things 3 0  Trouble relaxing 2 0  Restless 1 0  Easily annoyed or irritable 3 0  Afraid - awful might happen 1 0  Total GAD 7 Score 14 0     Review of Systems:   Pertinent items are noted in HPI Denies any headaches, blurred vision, fatigue, shortness of breath, chest pain, abdominal pain, abnormal vaginal discharge/itching/odor/irritation, problems with periods, bowel movements, urination, or intercourse unless otherwise stated above. Pertinent History Reviewed:  Reviewed past  medical,surgical, social and family history.  Reviewed problem list, medications and allergies. Physical Assessment:   Vitals:   02/25/24 1308  BP: 123/87  Pulse: 85  Weight: 200 lb (90.7 kg)  Height: 5' 3 (1.6 m)  Body mass index is 35.43 kg/m.        Physical Examination:   General appearance - well appearing, and in no distress  Mental status - alert, oriented to person, place, and time  Psych:  She has a normal mood and affect  Skin - warm and dry, normal color, no suspicious lesions noted  Chest - effort normal, all lung fields clear to auscultation bilaterally  Heart - normal rate and regular rhythm  Neck:  midline trachea, no thyromegaly or nodules  Abdomen - soft, nontender, nondistended, no masses or organomegaly  Pelvic - VULVA: normal appearing vulva with no masses, tenderness or lesions  VAGINA: normal appearing vagina with normal color and discharge, no lesions  CERVIX: normal appearing cervix without discharge or lesions, no CMT  Thin prep pap is done with HR HPV cotesting  UTERUS: uterus is felt to be normal size, shape, consistency and nontender   ADNEXA: No adnexal masses or tenderness noted.  Extremities:  No swelling or varicosities noted  Chaperone present for exam  Results for orders placed or performed in visit on 02/25/24 (from the past 24 hours)  POCT urine pregnancy   Collection Time: 02/25/24  1:43 PM  Result Value Ref Range   Preg Test, Ur Negative Negative    Assessment & Plan:   1. Well woman exam with routine gynecological exam (Primary) Pap collected today. Desires pregnancy. Advised PNV, abstinence from alcohol/substances. Routine STI screening as well. - Cytology - PAP( [place]) - Cervicovaginal ancillary only( [place]) - Hepatitis C Antibody - Hepatitis B Surface AntiGEN - HIV antibody (with reflex) - RPR  2. Anxiety and depression New problem, chronic, uncontrolled. No SI today. Is amenable to starting counseling as well as  SSRI. Discussed Zoloft  is pregnancy safe, common side effects, and 4-6 weeks to start seeing improvement in symptoms. - Ambulatory referral to [facility] - sertraline  (ZOLOFT ) 50 MG tablet; Take 1 tablet (50 mg total) by mouth daily.  Dispense: 90 tablet; Refill: 1  3. Desire for pregnancy - POCT urine pregnancy  4. Recurrent BV Chronic, uncontrolled. - No current symptoms but has had several documented infections in past year. Discussed conservative measures as well as treating partner  Mammogram: @ 40yo, or sooner if problems Colonoscopy: @ 31yo, or sooner if problems  Orders Placed This Encounter  Procedures   Hepatitis C Antibody   Hepatitis B Surface AntiGEN   HIV antibody (with reflex)   RPR   Ambulatory referral to [facility]   POCT urine pregnancy    Meds:  Meds ordered this encounter  Medications   sertraline  (ZOLOFT ) 50 MG tablet    Sig: Take 1 tablet (50 mg total) by mouth daily.    Dispense:  90 tablet    Refill:  1   metroNIDAZOLE  (FLAGYL ) 500 MG tablet    Sig: Take 1 tablet (500 mg total) by mouth 2 (two) times daily for 7 days.    Dispense:  14 tablet    Refill:  0   clindamycin  (CLEOCIN ) 2 % vaginal cream    Sig: Place 1 Applicatorful vaginally at bedtime.    Dispense:  40 g    Refill:  0    Follow-up: Return in about 3 months (around 05/27/2024) for Mood check, recurrent BV.  Almarie CHRISTELLA Moats, MD 02/25/2024 2:00 PM

## 2024-02-25 NOTE — Progress Notes (Signed)
 Pt presents for annual. Pt has no questions or concerns at this time. Pt PHQ-9 is 8, GAD-7 is 14. Pt needs a referral and a secure chat to [facility]

## 2024-02-25 NOTE — Patient Instructions (Signed)
 Natural remedies/prevention to try for bacterial vaginosis: - Take a probiotic tablet/capsule every day for at least 1-2 months - Whenever you have symptoms, use boric acid suppositories or tampons with coconut oil and 2-3 drops of tea tree oil vaginally every night for a week  - Do not use scented soaps/perfumes in the vaginal area and do not overwash multiple times daily - Wear breathable cotton underwear and do not wear tight restrictive clothing - Limit pantyliner use, change your underwear several times daily instead - Use condoms during intercourse

## 2024-02-26 LAB — CERVICOVAGINAL ANCILLARY ONLY
Bacterial Vaginitis (gardnerella): POSITIVE — AB
Candida Glabrata: NEGATIVE
Candida Vaginitis: NEGATIVE
Chlamydia: NEGATIVE
Comment: NEGATIVE
Comment: NEGATIVE
Comment: NEGATIVE
Comment: NEGATIVE
Comment: NEGATIVE
Comment: NORMAL
Neisseria Gonorrhea: NEGATIVE
Trichomonas: NEGATIVE

## 2024-02-26 LAB — HIV ANTIBODY (ROUTINE TESTING W REFLEX): HIV Screen 4th Generation wRfx: NONREACTIVE

## 2024-02-26 LAB — HEPATITIS C ANTIBODY: Hep C Virus Ab: NONREACTIVE

## 2024-02-26 LAB — RPR: RPR Ser Ql: NONREACTIVE

## 2024-02-26 LAB — HEPATITIS B SURFACE ANTIGEN: Hepatitis B Surface Ag: NEGATIVE

## 2024-02-28 ENCOUNTER — Ambulatory Visit: Payer: Self-pay | Admitting: Obstetrics and Gynecology

## 2024-02-28 MED ORDER — METRONIDAZOLE 500 MG PO TABS: 500.0000 mg | ORAL_TABLET | Freq: Two times a day (BID) | ORAL | 0 refills | Status: DC

## 2024-03-02 ENCOUNTER — Other Ambulatory Visit: Payer: Self-pay | Admitting: Obstetrics and Gynecology

## 2024-03-02 DIAGNOSIS — B9689 Other specified bacterial agents as the cause of diseases classified elsewhere: Secondary | ICD-10-CM

## 2024-03-02 MED ORDER — CLINDESSE 2 % VA CREA: 0.5000 g | TOPICAL_CREAM | Freq: Two times a day (BID) | VAGINAL | 0 refills | Status: AC

## 2024-03-02 NOTE — Progress Notes (Signed)
 Sent in clindesse  for insurance coverage

## 2024-03-03 ENCOUNTER — Telehealth: Payer: Self-pay

## 2024-03-03 ENCOUNTER — Other Ambulatory Visit: Payer: Self-pay

## 2024-03-03 MED ORDER — FLUCONAZOLE 150 MG PO TABS
150.0000 mg | ORAL_TABLET | Freq: Once | ORAL | 0 refills | Status: AC
Start: 2024-03-03 — End: 2024-03-03

## 2024-03-03 NOTE — Telephone Encounter (Signed)
 Returned call, pt had questions about flagyl  prescriptions that were sent for her and her partner. Pt states that their is a difference in the pills because of number identifier and it tastes different. Advised pt that the same medication and strength were sent to pharmacy, one could be brand or generic version. Advised pt to consult with pharmacist for more information.

## 2024-03-03 NOTE — Progress Notes (Signed)
 Sent per pt request after antibiotic use.

## 2024-03-05 LAB — CYTOLOGY - PAP
Adequacy: ABSENT
Comment: NEGATIVE
Diagnosis: NEGATIVE
High risk HPV: NEGATIVE

## 2024-03-23 DIAGNOSIS — Z419 Encounter for procedure for purposes other than remedying health state, unspecified: Secondary | ICD-10-CM | POA: Diagnosis not present

## 2024-04-23 DIAGNOSIS — Z419 Encounter for procedure for purposes other than remedying health state, unspecified: Secondary | ICD-10-CM | POA: Diagnosis not present

## 2024-04-29 ENCOUNTER — Encounter: Payer: Self-pay | Admitting: Licensed Clinical Social Worker

## 2024-05-19 ENCOUNTER — Encounter (HOSPITAL_COMMUNITY): Payer: Self-pay

## 2024-05-19 ENCOUNTER — Ambulatory Visit (HOSPITAL_COMMUNITY)
Admission: EM | Admit: 2024-05-19 | Discharge: 2024-05-19 | Disposition: A | Discharge status: Home / Self Care | Attending: Physician Assistant | Admitting: Physician Assistant

## 2024-05-19 DIAGNOSIS — R112 Nausea with vomiting, unspecified: Secondary | ICD-10-CM | POA: Diagnosis not present

## 2024-05-19 DIAGNOSIS — Z3202 Encounter for pregnancy test, result negative: Secondary | ICD-10-CM | POA: Diagnosis not present

## 2024-05-19 DIAGNOSIS — R110 Nausea: Secondary | ICD-10-CM

## 2024-05-19 LAB — POCT URINALYSIS DIP (MANUAL ENTRY)
Bilirubin, UA: NEGATIVE
Glucose, UA: NEGATIVE mg/dL
Ketones, POC UA: NEGATIVE mg/dL
Leukocytes, UA: NEGATIVE
Nitrite, UA: NEGATIVE
Protein Ur, POC: NEGATIVE mg/dL
Spec Grav, UA: 1.025 (ref 1.010–1.025)
Urobilinogen, UA: 0.2 U/dL
pH, UA: 6 (ref 5.0–8.0)

## 2024-05-19 LAB — POCT URINE PREGNANCY: Preg Test, Ur: NEGATIVE

## 2024-05-19 NOTE — Discharge Instructions (Signed)
 Repeat pregnancy test in 3-4 days.  Follow up with Gyn for recheck if period does not start in the next 1 week

## 2024-05-19 NOTE — ED Provider Notes (Signed)
 [facility]    CSN: [id number] Arrival date & time: 05/19/24  9195      History   Chief Complaint Chief Complaint  Patient presents with   Nausea   Breast Pain    HPI Angela Cortez is a 33 y.o. female.   Patient complains of bilateral breast discomfort, nausea and amenorrhea.  Patient reports that her menstrual cycle is 5 to 6 days late.  Patient reports she took a home pregnancy test which was negative.  Patient is concerned that she could be pregnant.  Patient has had similar symptoms in the past with pregnancy.  Patient denies any fever or chills she is not having any vomiting she is not having any diarrhea.  Patient denies any abdominal pain.  Patient reports that she has had normal gynecologic exams.  Patient denies any fever or chills she has not had any constipation.  Patient denies any risk of STD     Past Medical History:  Diagnosis Date   Medical history non-contributory     Patient Active Problem List   Diagnosis Date Noted   Well woman exam with routine gynecological exam 02/25/2024   Recurrent BV 02/25/2024   Ptyalism 07/05/2020   ASCUS of cervix with negative high risk HPV 05/31/2020   Obesity in pregnancy 05/28/2020   Mild tetrahydrocannabinol (THC) abuse 10/15/2016    Past Surgical History:  Procedure Laterality Date   ACROMIO-CLAVICULAR JOINT REPAIR     CESAREAN SECTION N/A 12/17/2020   Procedure: CESAREAN SECTION;  Surgeon: Herchel Gloris LABOR, MD;  Location: [facility];  Service: Obstetrics;  Laterality: N/A;  Nonreassuring FHR Tracing Arrest of Dilation    OB History     Gravida  6   Para  3   Term  3   Preterm      AB  2   Living  2      SAB      IAB  2   Ectopic      Multiple  0   Live Births  2            Home Medications    Prior to Admission medications   Medication Sig Start Date End Date Taking? Authorizing Provider  clindamycin  (CLEOCIN ) 2 % vaginal cream Place 1 Applicatorful vaginally at  bedtime. 02/25/24   Nicholaus Almarie HERO, MD  fluconazole  (DIFLUCAN ) 100 MG tablet 2 tablets by mouth the first day, then 1 tablet daily for 6 more days. Patient not taking: No sig reported 10/31/23   Vonna Sharlet POUR, MD  metroNIDAZOLE  (FLAGYL ) 500 MG tablet Take 1 tablet (500 mg total) by mouth 2 (two) times daily. 02/28/24   Nicholaus Almarie HERO, MD  sertraline  (ZOLOFT ) 50 MG tablet Take 1 tablet (50 mg total) by mouth daily. 02/25/24   Nicholaus Almarie HERO, MD    Family History Family History  Problem Relation Age of Onset   Diabetes Father    Hypertension Father    Cancer Paternal Uncle    Cancer Paternal Grandmother    Asthma Mother     Social History Social History   Tobacco Use   Smoking status: Former    Current packs/day: 0.00    Types: Cigarettes    Quit date: 02/04/2016    Years since quitting: 8.2   Smokeless tobacco: Never  Vaping Use   Vaping status: Former  Substance Use Topics   Alcohol use: Yes    Comment: socially   Drug use: Yes  Types: Marijuana     Allergies   Patient has no known allergies.   Review of Systems Review of Systems  All other systems reviewed and are negative.    Physical Exam Triage Vital Signs ED Triage Vitals  Encounter Vitals Group     BP 05/19/24 0832 121/75     Girls Systolic BP Percentile --      Girls Diastolic BP Percentile --      Boys Systolic BP Percentile --      Boys Diastolic BP Percentile --      Pulse Rate 05/19/24 0832 95     Resp 05/19/24 0832 20     Temp 05/19/24 0832 98.5 F (36.9 C)     Temp Source 05/19/24 0832 Oral     SpO2 05/19/24 0832 98 %     Weight --      Height --      Head Circumference --      Peak Flow --      Pain Score 05/19/24 0833 0     Pain Loc --      Pain Education --      Exclude from Growth Chart --    No data found.  Updated Vital Signs BP 121/75 (BP Location: Left Arm)   Pulse 95   Temp 98.5 F (36.9 C) (Oral)   Resp 20   SpO2 98%   Visual Acuity Right Eye  Distance:   Left Eye Distance:   Bilateral Distance:    Right Eye Near:   Left Eye Near:    Bilateral Near:     Physical Exam Vitals and nursing note reviewed.  Constitutional:      Appearance: She is well-developed.  HENT:     Head: Normocephalic.  Cardiovascular:     Rate and Rhythm: Normal rate.  Pulmonary:     Effort: Pulmonary effort is normal.  Abdominal:     General: Abdomen is flat. There is no distension.     Palpations: Abdomen is soft.  Musculoskeletal:        General: Normal range of motion.     Cervical back: Normal range of motion.  Skin:    General: Skin is warm.  Neurological:     General: No focal deficit present.     Mental Status: She is alert and oriented to person, place, and time.      UC Treatments / Results  Labs (all labs ordered are listed, but only abnormal results are displayed) Labs Reviewed  POCT URINALYSIS DIP (MANUAL ENTRY) - Abnormal; Notable for the following components:      Result Value   Blood, UA trace-intact (*)    All other components within normal limits  POCT URINE PREGNANCY    EKG UA is negative except for a trace of blood urine pregnancy is negative  Radiology No results found.  Procedures Procedures (including critical care time)  Medications Ordered in UC Medications - No data to display  Initial Impression / Assessment and Plan / UC Course  I have reviewed the triage vital signs and the nursing notes.  Pertinent labs & imaging results that were available during my care of the patient were reviewed by me and considered in my medical decision making (see chart for details).  Clinical Course as of 05/19/24 1042  Wed May 19, 2024  9143 POCT urine pregnancy [LS]    Clinical Course User Index [LS] Flint Sonny POUR, PA-C    Patient counseled on amenorrhea.  She  is advised to repeat a pregnancy test in 3 to 4 days.  Patient is given referral information for gynecology if she does not start her menstrual cycle  within the next week.  Patient is advised to return if any problems Final Clinical Impressions(s) / UC Diagnoses   Final diagnoses:  Nausea and vomiting, unspecified vomiting type  Nausea     Discharge Instructions      Repeat pregnancy test in 3-4 days.  Follow up with Gyn for recheck if period does not start in the next 1 week    ED Prescriptions   None    PDMP not reviewed this encounter. An After Visit Summary was printed and given to the patient.       Flint Sonny POUR, PA-C 05/19/24 1045

## 2024-05-19 NOTE — ED Triage Notes (Signed)
 Patient presents with 3-4 day missed cycle. Patients states she has nausea and her breast are tender. Negative home pregnancy test.

## 2024-05-23 DIAGNOSIS — Z419 Encounter for procedure for purposes other than remedying health state, unspecified: Secondary | ICD-10-CM | POA: Diagnosis not present

## 2024-05-24 ENCOUNTER — Other Ambulatory Visit (HOSPITAL_COMMUNITY)
Admission: RE | Admit: 2024-05-24 | Discharge: 2024-05-24 | Disposition: A | Discharge status: Home / Self Care | Source: Ambulatory Visit | Attending: Physician Assistant | Admitting: Physician Assistant

## 2024-05-24 ENCOUNTER — Ambulatory Visit (INDEPENDENT_AMBULATORY_CARE_PROVIDER_SITE_OTHER): Admitting: Physician Assistant

## 2024-05-24 ENCOUNTER — Encounter: Payer: Self-pay | Admitting: Physician Assistant

## 2024-05-24 VITALS — BP 127/90 | HR 80 | Ht 63.0 in | Wt 206.4 lb

## 2024-05-24 DIAGNOSIS — N926 Irregular menstruation, unspecified: Secondary | ICD-10-CM | POA: Diagnosis not present

## 2024-05-24 DIAGNOSIS — N898 Other specified noninflammatory disorders of vagina: Secondary | ICD-10-CM | POA: Diagnosis not present

## 2024-05-24 LAB — POCT URINE PREGNANCY: Preg Test, Ur: NEGATIVE

## 2024-05-24 NOTE — Progress Notes (Signed)
 GYNECOLOGY  VISIT   HPI: Angela Cortez is a 33 y.o. single female [phone] here for period being 8 days late, previously had nausea with several negative pregnancy tests at home and urgent care. Today she has started spotting. She would like to be tested for BV. Patient denies fever, chills, nausea, vomiting, cramping, abdominal pain.   GYNECOLOGIC HISTORY: Patient's last menstrual period was 04/18/2024 (exact date). Contraception: none.   Menopausal hormone therapy:  premenopausal Last mammogram: never done due to age  Last pap smear:  Diagnosis  Date Value Ref Range Status  02/25/2024   Final   - Negative for intraepithelial lesion or malignancy (NILM)           OB History     Gravida  6   Para  3   Term  3   Preterm      AB  2   Living  3      SAB      IAB  2   Ectopic      Multiple  0   Live Births  3              Patient Active Problem List   Diagnosis Date Noted   Well woman exam with routine gynecological exam 02/25/2024   Recurrent BV 02/25/2024   Ptyalism 07/05/2020   ASCUS of cervix with negative high risk HPV 05/31/2020   Obesity in pregnancy 05/28/2020   Mild tetrahydrocannabinol (THC) abuse 10/15/2016    Past Medical History:  Diagnosis Date   Medical history non-contributory     Past Surgical History:  Procedure Laterality Date   ACROMIO-CLAVICULAR JOINT REPAIR     CESAREAN SECTION N/A 12/17/2020   Procedure: CESAREAN SECTION;  Surgeon: Herchel Gloris LABOR, MD;  Location: [facility];  Service: Obstetrics;  Laterality: N/A;  Nonreassuring FHR Tracing Arrest of Dilation    Current Outpatient Medications  Medication Sig Dispense Refill   sertraline  (ZOLOFT ) 50 MG tablet Take 1 tablet (50 mg total) by mouth daily. 90 tablet 1   clindamycin  (CLEOCIN ) 2 % vaginal cream Place 1 Applicatorful vaginally at bedtime. (Patient not taking: Reported on 05/24/2024) 40 g 0   fluconazole  (DIFLUCAN ) 100 MG tablet 2 tablets by mouth the first day,  then 1 tablet daily for 6 more days. (Patient not taking: Reported on 05/24/2024) 8 tablet 0   metroNIDAZOLE  (FLAGYL ) 500 MG tablet Take 1 tablet (500 mg total) by mouth 2 (two) times daily. (Patient not taking: Reported on 05/24/2024) 14 tablet 0   No current facility-administered medications for this visit.     ALLERGIES: Patient has no known allergies.  Family History  Problem Relation Age of Onset   Diabetes Father    Hypertension Father    Cancer Paternal Uncle    Cancer Paternal Grandmother    Asthma Mother     Social History   Socioeconomic History   Marital status: Single    Spouse name: Not on file   Number of children: Not on file   Years of education: Not on file   Highest education level: Not on file  Occupational History   Not on file  Tobacco Use   Smoking status: Every Day    Current packs/day: 0.00    Types: Cigarettes    Last attempt to quit: 02/04/2016    Years since quitting: 8.3   Smokeless tobacco: Never  Vaping Use   Vaping status: Former  Substance and Sexual Activity   Alcohol use:  Yes    Comment: socially   Drug use: Yes    Types: Marijuana   Sexual activity: Yes    Partners: Male    Birth control/protection: None  Other Topics Concern   Not on file  Social History Narrative   Not on file   Social Drivers of Health   Financial Resource Strain: Not on file  Food Insecurity: Not on file  Transportation Needs: Not on file  Physical Activity: Not on file  Stress: Not on file  Social Connections: Not on file  Intimate Partner Violence: Not on file    Review of Systems  PHYSICAL EXAMINATION:    BP (!) 127/90   Pulse 80   Ht 5' 3 (1.6 m)   Wt 206 lb 6.4 oz (93.6 kg)   LMP 04/18/2024 (Exact Date)   BMI 36.56 kg/m     General appearance: alert, cooperative and appears stated age Head: Normocephalic, without obvious abnormality, atraumatic Lungs: normal respiratory effort Heart: regular rate  Abdomen: soft, non-tender, no  masses,  no organomegaly Extremities: extremities normal, atraumatic, no cyanosis or edema Neurologic: Grossly normal  Pelvic: External genitalia:  no lesions              Urethra:  normal appearing urethra with no masses, tenderness or lesions              Bartholins and Skenes: normal                 Vagina: +Bleeding in vaginal vault without clots or tissue. External os closed. Normal appearing vagina with normal color and discharge, no lesions              Cervix: no lesions               Chaperone was present for exam  ASSESSMENT & PLAN   1. Late menses (Primary) Patient's menses is 8 days late. One the first couple of days that she was late, she had accompanying nausea, but negative at-home and urgent care pregnancy tests. She now has blood in vaginal vault which may be her period after all. Considered chemical pregnancy as well. Will run AUB labs to ensure no other cause of bleeding. Will consider pelvic US  if beta hcg negative and still having unusual pattern of bleeding.  - POCT urine pregnancy - TSH Rfx on Abnormal to Free T4 - CBC - Beta hCG quant (ref lab) - Prolactin  2. Vaginal discharge - Cervicovaginal ancillary only( [place])   An After Visit Summary was printed and given to the patient.  Webber Michiels E Briseyda Fehr, PA-C 10/13/20253:05 PM

## 2024-05-24 NOTE — Progress Notes (Signed)
 Late period. Went to urgent care, neg preg test.   Was spotting this morning, but period is over a week late.   Pt concerned of BV. Symptoms started yesterday. Pt has history of reoccuring BV.   BP 129/90 and 127/90.

## 2024-05-25 LAB — CERVICOVAGINAL ANCILLARY ONLY
Bacterial Vaginitis (gardnerella): POSITIVE — AB
Candida Glabrata: NEGATIVE
Candida Vaginitis: NEGATIVE
Chlamydia: NEGATIVE
Comment: NEGATIVE
Comment: NEGATIVE
Comment: NEGATIVE
Comment: NEGATIVE
Comment: NEGATIVE
Comment: NORMAL
Neisseria Gonorrhea: NEGATIVE
Trichomonas: NEGATIVE

## 2024-05-25 LAB — BETA HCG QUANT (REF LAB): hCG Quant: <1 m[IU]/mL

## 2024-05-25 LAB — CBC
Hematocrit: 38.9 % (ref 34.0–46.6)
Hemoglobin: 12.4 g/dL (ref 11.1–15.9)
MCH: 31.4 pg (ref 26.6–33.0)
MCHC: 31.9 g/dL (ref 31.5–35.7)
MCV: 99 fL — ABNORMAL HIGH (ref 79–97)
Platelets: 333 x10E3/uL (ref 150–450)
RBC: 3.95 x10E6/uL (ref 3.77–5.28)
RDW: 11.9 % (ref 11.7–15.4)
WBC: 8.7 x10E3/uL (ref 3.4–10.8)

## 2024-05-25 LAB — PROLACTIN: Prolactin: 9.8 ng/mL (ref 4.8–33.4)

## 2024-05-25 LAB — TSH RFX ON ABNORMAL TO FREE T4: TSH: 1.76 u[IU]/mL (ref 0.450–4.500)

## 2024-05-28 ENCOUNTER — Telehealth: Payer: Self-pay

## 2024-05-28 ENCOUNTER — Ambulatory Visit (HOSPITAL_COMMUNITY): Attending: Physician Assistant

## 2024-05-28 MED ORDER — METRONIDAZOLE 500 MG PO TABS: 500.0000 mg | ORAL_TABLET | Freq: Two times a day (BID) | ORAL | 0 refills | Status: DC

## 2024-05-28 NOTE — Telephone Encounter (Signed)
 Returned call for rx after +BV, sent per protocol. Pt wants to know if u/s appt today is necessary after hcg results. Messaged provider and advised pt that if she has had her cycle she does dont need u/s. Pt states only spotting so she will go to the appt.

## 2024-05-29 ENCOUNTER — Ambulatory Visit: Payer: Self-pay | Admitting: Physician Assistant

## 2024-08-14 ENCOUNTER — Other Ambulatory Visit: Payer: Self-pay

## 2024-08-14 ENCOUNTER — Inpatient Hospital Stay (HOSPITAL_COMMUNITY)
Admission: AD | Admit: 2024-08-14 | Discharge: 2024-08-14 | Disposition: A | Discharge status: Home / Self Care | Attending: Obstetrics and Gynecology | Admitting: Obstetrics and Gynecology

## 2024-08-14 DIAGNOSIS — N926 Irregular menstruation, unspecified: Secondary | ICD-10-CM

## 2024-08-14 DIAGNOSIS — N912 Amenorrhea, unspecified: Secondary | ICD-10-CM | POA: Diagnosis present

## 2024-08-14 DIAGNOSIS — N946 Dysmenorrhea, unspecified: Secondary | ICD-10-CM

## 2024-08-14 DIAGNOSIS — Z3202 Encounter for pregnancy test, result negative: Secondary | ICD-10-CM | POA: Insufficient documentation

## 2024-08-14 LAB — POCT PREGNANCY, URINE: Preg Test, Ur: NEGATIVE

## 2024-08-14 NOTE — MAU Note (Signed)
 Angela Cortez is a 34 y.o. at Unknown here in MAU reporting: Period is late but HPT has been negative. Feeling lower abdominal cramping and pelvic pressure. States this happened in October as well. Denies any vaginal bleeding or spotting.   LMP: 07/09/24 Onset of complaint: 9 days late on period  Pain score: 0- slight cramping  Vitals:   08/14/24 1151  BP: 129/86  Pulse: 89  Resp: 17  Temp: 98 F (36.7 C)  SpO2: 99%     FHT:n/a Lab orders placed from triage:  n/a

## 2024-08-14 NOTE — MAU Provider Note (Signed)
 " History     [id number]  Arrival date and time: 08/14/24 1029    No chief complaint on file.    HPI Angela Cortez is a 34 y.o. who presents for missed period. She is 9 days late and concerned she could be pregnant. UPT at home negative. She has no complaints, denies VB, vaginal irritation, dysuria/urgency/frequency. Was seen for similar complaint at [facility] in October of 2025 but did not follow up.       Past Medical History:  Diagnosis Date   Medical history non-contributory     Past Surgical History:  Procedure Laterality Date   ACROMIO-CLAVICULAR JOINT REPAIR     CESAREAN SECTION N/A 12/17/2020   Procedure: CESAREAN SECTION;  Surgeon: Herchel Gloris LABOR, MD;  Location: [facility];  Service: Obstetrics;  Laterality: N/A;  Nonreassuring FHR Tracing Arrest of Dilation    Family History  Problem Relation Age of Onset   Diabetes Father    Hypertension Father    Cancer Paternal Uncle    Cancer Paternal Grandmother    Asthma Mother     Social History   Socioeconomic History   Marital status: Single    Spouse name: Not on file   Number of children: Not on file   Years of education: Not on file   Highest education level: Not on file  Occupational History   Not on file  Tobacco Use   Smoking status: Every Day    Current packs/day: 0.00    Average packs/day: 0.3 packs/day    Types: Cigarettes    Last attempt to quit: 02/04/2016    Years since quitting: 8.5   Smokeless tobacco: Never  Vaping Use   Vaping status: Former  Substance and Sexual Activity   Alcohol use: Yes    Comment: socially   Drug use: Yes    Types: Marijuana   Sexual activity: Yes    Partners: Male    Birth control/protection: None  Other Topics Concern   Not on file  Social History Narrative   Not on file   Social Drivers of Health   Tobacco Use: High Risk (05/24/2024)   Patient History    Smoking Tobacco Use: Every Day    Smokeless Tobacco Use: Never    Passive Exposure: Not on file   Financial Resource Strain: Not on file  Food Insecurity: Not on file  Transportation Needs: Not on file  Physical Activity: Not on file  Stress: Not on file  Social Connections: Not on file  Intimate Partner Violence: Not on file  Depression (PHQ2-9): Medium Risk (02/25/2024)   Depression (PHQ2-9)    PHQ-2 Score: 8  Alcohol Screen: Not on file  Housing: Not on file  Utilities: Not on file  Health Literacy: Not on file    Allergies[1]  Medications Ordered Prior to Encounter[2]  Pertinent positives and negative per HPI, all others reviewed and negative  Physical Exam   BP 129/86 (BP Location: Right Arm)   Pulse 89   Temp 98 F (36.7 C) (Oral)   Resp 17   Ht 5' 3 (1.6 m)   Wt 97.8 kg   SpO2 99%   BMI 38.17 kg/m   Patient Vitals for the past 24 hrs:  BP Temp Temp src Pulse Resp SpO2 Height Weight  08/14/24 1151 129/86 98 F (36.7 C) Oral 89 17 99 % 5' 3 (1.6 m) 97.8 kg    Physical Exam Vitals and nursing note reviewed.  Constitutional:  Appearance: She is well-developed.  HENT:     Head: Normocephalic and atraumatic.     Mouth/Throat:     Mouth: Mucous membranes are moist.  Eyes:     Extraocular Movements: Extraocular movements intact.  Cardiovascular:     Rate and Rhythm: Normal rate and regular rhythm.  Pulmonary:     Effort: Pulmonary effort is normal.  Abdominal:     Palpations: Abdomen is soft.     Tenderness: There is no abdominal tenderness.  Skin:    Capillary Refill: Capillary refill takes less than 2 seconds.  Neurological:     General: No focal deficit present.     Mental Status: She is alert.     Labs No results found for this or any previous visit (from the past 24 hours).  Imaging No results found.  MAU Course  Procedures Lab Orders  No laboratory test(s) ordered today   No orders of the defined types were placed in this encounter.  Imaging Orders  No imaging studies ordered today    MDM Low (Level 2)  Assessment  and Plan  Missed period  Dysmenorrhea    Angela Cortez is a 34 y.o. who presents for missed period.   -UPT in MAU negative -Patient has no other complaints at this time  -Encouraged patient to follow up with OB provider for dysmenorrhea work up.  -Stable for discharge.    [name] L [name], MD/MHA 08/14/2024 12:06 PM  Allergies as of 08/14/2024   No Known Allergies      Medication List     STOP taking these medications    clindamycin  2 % vaginal cream Commonly known as: CLEOCIN    fluconazole  100 MG tablet Commonly known as: Diflucan    metroNIDAZOLE  500 MG tablet Commonly known as: FLAGYL        TAKE these medications    sertraline  50 MG tablet Commonly known as: Zoloft  Take 1 tablet (50 mg total) by mouth daily.           [1] No Known Allergies [2]  No current facility-administered medications on file prior to encounter.   Current Outpatient Medications on File Prior to Encounter  Medication Sig Dispense Refill   clindamycin  (CLEOCIN ) 2 % vaginal cream Place 1 Applicatorful vaginally at bedtime. (Patient not taking: Reported on 05/24/2024) 40 g 0   fluconazole  (DIFLUCAN ) 100 MG tablet 2 tablets by mouth the first day, then 1 tablet daily for 6 more days. (Patient not taking: Reported on 05/24/2024) 8 tablet 0   metroNIDAZOLE  (FLAGYL ) 500 MG tablet Take 1 tablet (500 mg total) by mouth 2 (two) times daily. (Patient not taking: Reported on 05/24/2024) 14 tablet 0   metroNIDAZOLE  (FLAGYL ) 500 MG tablet Take 1 tablet (500 mg total) by mouth 2 (two) times daily. 14 tablet 0   sertraline  (ZOLOFT ) 50 MG tablet Take 1 tablet (50 mg total) by mouth daily. 90 tablet 1   "

## 2024-09-01 ENCOUNTER — Ambulatory Visit: Payer: Self-pay | Admitting: Obstetrics & Gynecology
